# Patient Record
Sex: Female | Born: 1955
Health system: Southern US, Community
[De-identification: ages and names within clinical notes are randomized; demographics above are authoritative.]

## PROBLEM LIST (undated history)

## (undated) DIAGNOSIS — F329 Major depressive disorder, single episode, unspecified: Secondary | ICD-10-CM

## (undated) DIAGNOSIS — S93402A Sprain of unspecified ligament of left ankle, initial encounter: Secondary | ICD-10-CM

## (undated) DIAGNOSIS — G8929 Other chronic pain: Secondary | ICD-10-CM

## (undated) DIAGNOSIS — D649 Anemia, unspecified: Secondary | ICD-10-CM

## (undated) DIAGNOSIS — M810 Age-related osteoporosis without current pathological fracture: Secondary | ICD-10-CM

## (undated) DIAGNOSIS — M3500 Sicca syndrome, unspecified: Secondary | ICD-10-CM

## (undated) DIAGNOSIS — M069 Rheumatoid arthritis, unspecified: Secondary | ICD-10-CM

## (undated) DIAGNOSIS — D0510 Intraductal carcinoma in situ of unspecified breast: Secondary | ICD-10-CM

## (undated) DIAGNOSIS — F419 Anxiety disorder, unspecified: Secondary | ICD-10-CM

## (undated) DIAGNOSIS — M545 Low back pain: Secondary | ICD-10-CM

## (undated) HISTORY — DX: Anxiety disorder, unspecified: F41.9

## (undated) HISTORY — DX: Age-related osteoporosis without current pathological fracture: M81.0

## (undated) HISTORY — PX: BREAST SURGERY: SHX581

## (undated) HISTORY — DX: Rheumatoid arthritis, unspecified: M06.9

## (undated) HISTORY — DX: Major depressive disorder, single episode, unspecified: F32.9

## (undated) HISTORY — DX: Sjogren syndrome, unspecified: M35.00

## (undated) HISTORY — DX: Other chronic pain: G89.29

## (undated) HISTORY — DX: Low back pain: M54.5

## (undated) HISTORY — DX: Anemia, unspecified: D64.9

## (undated) HISTORY — PX: OTHER SURGICAL HISTORY: SHX169

## (undated) HISTORY — DX: Sprain of unspecified ligament of left ankle, initial encounter: S93.402A

## (undated) HISTORY — DX: Intraductal carcinoma in situ of unspecified breast: D05.10

## (undated) HISTORY — PX: BREAST BIOPSY: SHX20

---

## 1988-02-02 HISTORY — PX: LAPAROSCOPY: SHX197

## 1990-02-01 HISTORY — PX: BREAST LUMPECTOMY: SHX2

## 1990-11-02 DIAGNOSIS — D0510 Intraductal carcinoma in situ of unspecified breast: Secondary | ICD-10-CM | POA: Insufficient documentation

## 1994-02-01 HISTORY — PX: MOUTH RANULA EXCISION: SHX2049

## 1997-09-27 ENCOUNTER — Other Ambulatory Visit: Admission: RE | Admit: 1997-09-27 | Discharge: 1997-09-27 | Payer: Self-pay | Admitting: Obstetrics and Gynecology

## 1997-10-22 ENCOUNTER — Ambulatory Visit (HOSPITAL_COMMUNITY): Admission: RE | Admit: 1997-10-22 | Discharge: 1997-10-22 | Payer: Self-pay | Admitting: Obstetrics and Gynecology

## 1997-11-04 ENCOUNTER — Ambulatory Visit (HOSPITAL_COMMUNITY): Admission: RE | Admit: 1997-11-04 | Discharge: 1997-11-04 | Payer: Self-pay | Admitting: *Deleted

## 1998-04-01 ENCOUNTER — Ambulatory Visit (HOSPITAL_COMMUNITY): Admission: RE | Admit: 1998-04-01 | Discharge: 1998-04-01 | Payer: Self-pay | Admitting: *Deleted

## 1998-10-08 ENCOUNTER — Ambulatory Visit (HOSPITAL_COMMUNITY): Admission: RE | Admit: 1998-10-08 | Discharge: 1998-10-08 | Payer: Self-pay | Admitting: *Deleted

## 1998-12-05 ENCOUNTER — Other Ambulatory Visit: Admission: RE | Admit: 1998-12-05 | Discharge: 1998-12-05 | Payer: Self-pay | Admitting: *Deleted

## 1999-10-13 ENCOUNTER — Encounter: Admission: RE | Admit: 1999-10-13 | Discharge: 1999-10-13 | Payer: Self-pay | Admitting: *Deleted

## 2000-06-02 ENCOUNTER — Other Ambulatory Visit: Admission: RE | Admit: 2000-06-02 | Discharge: 2000-06-02 | Payer: Self-pay | Admitting: *Deleted

## 2000-10-21 ENCOUNTER — Encounter: Admission: RE | Admit: 2000-10-21 | Discharge: 2000-10-21 | Payer: Self-pay | Admitting: *Deleted

## 2000-11-07 ENCOUNTER — Encounter (INDEPENDENT_AMBULATORY_CARE_PROVIDER_SITE_OTHER): Payer: Self-pay | Admitting: *Deleted

## 2000-11-07 ENCOUNTER — Ambulatory Visit (HOSPITAL_BASED_OUTPATIENT_CLINIC_OR_DEPARTMENT_OTHER): Admission: RE | Admit: 2000-11-07 | Discharge: 2000-11-07 | Payer: Self-pay | Admitting: Surgery

## 2000-11-07 ENCOUNTER — Encounter: Payer: Self-pay | Admitting: Surgery

## 2001-05-15 ENCOUNTER — Other Ambulatory Visit: Admission: RE | Admit: 2001-05-15 | Discharge: 2001-09-14 | Payer: Self-pay | Admitting: *Deleted

## 2001-10-31 ENCOUNTER — Encounter: Admission: RE | Admit: 2001-10-31 | Discharge: 2001-10-31 | Payer: Self-pay | Admitting: *Deleted

## 2002-08-10 ENCOUNTER — Emergency Department (HOSPITAL_COMMUNITY): Admission: EM | Admit: 2002-08-10 | Discharge: 2002-08-10 | Payer: Self-pay | Admitting: Emergency Medicine

## 2002-08-10 ENCOUNTER — Encounter: Payer: Self-pay | Admitting: Emergency Medicine

## 2002-11-06 ENCOUNTER — Encounter: Admission: RE | Admit: 2002-11-06 | Discharge: 2002-11-06 | Payer: Self-pay | Admitting: *Deleted

## 2003-11-07 ENCOUNTER — Encounter: Admission: RE | Admit: 2003-11-07 | Discharge: 2003-11-07 | Payer: Self-pay | Admitting: *Deleted

## 2004-03-19 ENCOUNTER — Encounter: Admission: RE | Admit: 2004-03-19 | Discharge: 2004-03-19 | Payer: Self-pay | Admitting: *Deleted

## 2004-12-15 ENCOUNTER — Encounter: Admission: RE | Admit: 2004-12-15 | Discharge: 2004-12-15 | Payer: Self-pay | Admitting: *Deleted

## 2005-02-01 HISTORY — PX: ORIF FOOT FRACTURE: SHX2123

## 2005-03-18 ENCOUNTER — Ambulatory Visit (HOSPITAL_COMMUNITY): Admission: RE | Admit: 2005-03-18 | Discharge: 2005-03-18 | Payer: Self-pay | Admitting: *Deleted

## 2005-04-03 ENCOUNTER — Emergency Department (HOSPITAL_COMMUNITY): Admission: EM | Admit: 2005-04-03 | Discharge: 2005-04-03 | Payer: Self-pay | Admitting: Family Medicine

## 2005-04-27 ENCOUNTER — Encounter: Admission: RE | Admit: 2005-04-27 | Discharge: 2005-04-27 | Payer: Self-pay | Admitting: *Deleted

## 2005-05-02 DIAGNOSIS — Z9889 Other specified postprocedural states: Secondary | ICD-10-CM | POA: Insufficient documentation

## 2006-02-22 ENCOUNTER — Encounter: Admission: RE | Admit: 2006-02-22 | Discharge: 2006-02-22 | Payer: Self-pay | Admitting: *Deleted

## 2006-10-19 ENCOUNTER — Encounter: Admission: RE | Admit: 2006-10-19 | Discharge: 2006-10-19 | Payer: Self-pay | Admitting: Obstetrics and Gynecology

## 2007-01-20 ENCOUNTER — Emergency Department (HOSPITAL_COMMUNITY): Admission: EM | Admit: 2007-01-20 | Discharge: 2007-01-20 | Payer: Self-pay | Admitting: Family Medicine

## 2007-04-10 ENCOUNTER — Encounter: Admission: RE | Admit: 2007-04-10 | Discharge: 2007-04-10 | Payer: Self-pay | Admitting: Obstetrics and Gynecology

## 2008-06-12 ENCOUNTER — Encounter: Admission: RE | Admit: 2008-06-12 | Discharge: 2008-06-12 | Payer: Self-pay | Admitting: Obstetrics and Gynecology

## 2009-07-31 ENCOUNTER — Encounter: Admission: RE | Admit: 2009-07-31 | Discharge: 2009-07-31 | Payer: Self-pay | Admitting: Obstetrics and Gynecology

## 2010-02-01 HISTORY — PX: ABCESS DRAINAGE: SHX399

## 2010-02-22 ENCOUNTER — Encounter: Payer: Self-pay | Admitting: Family Medicine

## 2010-06-19 NOTE — Op Note (Signed)
[place]  Patient:    Andrea, Fields Visit Number: [id number] MRN: [id number]          Service Type: DSU Location: [facility] Attending Physician:  Andre Lefort Dictated by:   Sandria Bales. Ezzard Standing, M.D. Proc. Date: 11/07/00 Admit Date:  11/07/2000   CC:         Pershing Cox, M.D.  Dr. Jess Barters   Operative Report  DATE OF BIRTH:  October 17, 1955.  PREOPERATIVE DIAGNOSIS:  Microcalcifications, right breast, about the 8 to 9 oclock position.  POSTOPERATIVE DIAGNOSIS:  Microcalcifications, right breast, 8 oclock position.  PROCEDURE:  Needle-localized right breast biopsy.  SURGEON:  Sandria Bales. Ezzard Standing, M.D.  ANESTHESIA:  MAC with about 18 cc of 1% Xylocaine with epinephrine.  COMPLICATIONS:  None.  INDICATION FOR PROCEDURE:  [name] is a 55 year old white female who had a prior biopsy in 1992, at which time she had a ductal carcinoma in situ.  She has had no evidence of recurrence of disease.  She has recently had a mammogram which showed microcalcification in the right breast at the 8 to 9 oclock position.  She now comes for needle-localized excisional biopsy.  DESCRIPTION OF PROCEDURE:  The patient had a needle localization performed at the breast center.  She had a wire coming from out of her breast about the 8 oclock position approximately 2-3 cm off the edge of the areola.  Her right breast was prepped with Betadine solution and sterilely draped.  Using the wire as a guide, I made an incision which was about 3.5 cm in the skin and cored out approximately a 2 x 2 x 4 cm breast biopsy.  I did go down to the pectoralis chest muscle.  The specimen was sent for specimen mammogram, which confirmed the microcalcifications were excised.  The wound was then irrigated.  Hemostasis was controlled with Bovie electrocautery.  The subcutaneous tissue was closed with 3-0 Vicryl suture and the skin closed with a 5-0 Vicryl suture,  painted with benzoin and Steri-Strips, and sterilely dressed.  The patient tolerated the procedure well, was transported to the recovery room in good condition. Dictated by:   Sandria Bales. Ezzard Standing, M.D. Attending Physician:  Andre Lefort DD:  11/07/00 TD:  11/08/00 Job: [id number]

## 2010-09-15 ENCOUNTER — Other Ambulatory Visit: Payer: Self-pay | Admitting: Obstetrics & Gynecology

## 2010-09-15 DIAGNOSIS — Z1231 Encounter for screening mammogram for malignant neoplasm of breast: Secondary | ICD-10-CM

## 2010-09-21 ENCOUNTER — Ambulatory Visit
Admission: RE | Admit: 2010-09-21 | Discharge: 2010-09-21 | Disposition: A | Discharge status: Home / Self Care | Payer: Federal, State, Local not specified - PPO | Source: Ambulatory Visit | Attending: Obstetrics & Gynecology | Admitting: Obstetrics & Gynecology

## 2010-09-21 DIAGNOSIS — Z1231 Encounter for screening mammogram for malignant neoplasm of breast: Secondary | ICD-10-CM

## 2010-10-14 ENCOUNTER — Ambulatory Visit (INDEPENDENT_AMBULATORY_CARE_PROVIDER_SITE_OTHER): Payer: Federal, State, Local not specified - PPO | Admitting: Internal Medicine

## 2010-10-14 ENCOUNTER — Encounter: Payer: Self-pay | Admitting: Internal Medicine

## 2010-10-14 VITALS — BP 102/68 | HR 72 | Temp 98.2°F | Ht 61.5 in | Wt 104.0 lb

## 2010-10-14 DIAGNOSIS — M8580 Other specified disorders of bone density and structure, unspecified site: Secondary | ICD-10-CM

## 2010-10-14 DIAGNOSIS — R002 Palpitations: Secondary | ICD-10-CM

## 2010-10-14 DIAGNOSIS — R42 Dizziness and giddiness: Secondary | ICD-10-CM

## 2010-10-14 DIAGNOSIS — Z0000 Encounter for general adult medical examination without abnormal findings: Secondary | ICD-10-CM | POA: Insufficient documentation

## 2010-10-14 DIAGNOSIS — M899 Disorder of bone, unspecified: Secondary | ICD-10-CM

## 2010-10-14 DIAGNOSIS — M949 Disorder of cartilage, unspecified: Secondary | ICD-10-CM

## 2010-10-14 NOTE — Assessment & Plan Note (Addendum)
DEXA from 07/2009 reviewed.  She has multiple risk factors.  10 yr hip fx risk 3%.  I would not recommend bisphonates due to multiple dental issues.  Pt to consider using prolia. Pt advised to take vitamin D supplement daily. LEFT FEMUR NECK  Bone Mineral Density (BMD): 0.584 g/cm2  Young Adult T Score: -2.4  Z Score: -1.3

## 2010-10-14 NOTE — Assessment & Plan Note (Signed)
Pt with intermittent fluttering sensation.  Cardiac exam is norma.  EKG shows  NSR at 63 bpm.  Left atrial enlargement. Check BMET, TFTs.  Pt advised to reduce caffeine intake.

## 2010-10-14 NOTE — Assessment & Plan Note (Signed)
Reviewed adult health maintenance protocols. Arrange screening colonoscopy Encouraged smoking cessation

## 2010-10-14 NOTE — Patient Instructions (Addendum)
Please complete the following lab tests within 1-2 weeks:  TSH, Free T4, BMET  - 785.1 FLP, CBC - v70 Please call our office if you decide to proceed with Prolia injections Try performing Austin Miles exercises re:  Dizziness.  Also try checking your blood pressure laying down then standing in AM.

## 2010-10-14 NOTE — Assessment & Plan Note (Signed)
Pt with mild intermittent dizziness mainly in AM getting out of bed.  Possible mild BPV.  Trial of brandt daroff exercises.  Pt is nurse.  I asked to check BP laying then standing in AM. Patient advised to call office if symptoms persist or worsen.

## 2010-10-14 NOTE — Progress Notes (Signed)
Subjective:    Patient ID: Andrea Fields, female    DOB: July 19, 1955, 55 y.o.   MRN: [id number]  HPI  55 y/o white female to establish.  She has lived in [place] for 14 yrs but not had PCP.  She is followed by her GYN.  She notes last complete blood work within last 1-2 yrs.  She does not recall any significant abnormality.  She is due for FLP.  She reports prev HS CRP normal.  She complains of intermittent mild dizziness. Her symptoms has been chronic for years.  Her symptoms are most noticeable when she tries to get out of bed.  She denies associated headache, tinnitus, hearing loss or ear fullness.  She also has noticed intermittent fluttering in her chest.  No assoc chest pain or presyncope.  She drinks one to 1.5 cups of coffee per day.   She has only Fields glass of wine at bedtime.  She also has hx of osteopenia.  Prev vit d level reported normal.  She has hx of left foot metatarsal fracture.  She has never taken a bisphosphonate.  She mentions she has had numerous oral surgeries in the recent past and is considering dental implants.   Review of Systems  Constitutional: Negative for activity change, appetite change and unexpected weight change.  Eyes: Negative for visual disturbance.  Respiratory: Negative for cough, chest tightness and shortness of breath.   Cardiovascular: Negative for chest pain.  Genitourinary: Negative for difficulty urinating.  Neurological: Negative for headaches.  Gastrointestinal: Negative for abdominal pain, heartburn melena or hematochezia Psych: Negative for depression or anxiety.  Stressed out from two teenage children      Past Medical History  Diagnosis Date  . Anemia   . Arthritis   . DCIS (ductal carcinoma in situ)     History   Social History  . Marital Status: Married    Spouse Name: N/A    Number of Children: N/A  . Years of Education: N/A   Occupational History  . Not on file.   Social History Main Topics  . Smoking status:  Current Everyday Smoker    Types: Cigarettes  . Smokeless tobacco: Not on file   Comment: 1 cigarette per day  . Alcohol Use: Yes  . Drug Use: No  . Sexually Active:    Other Topics Concern  . Not on file   Social History Narrative  . No narrative on file    Past Surgical History  Procedure Date  . Breast surgery 1992, 2002    biopsy  . Mouth ranula excision 1996  . Laparoscopy 1990  . Orif foot fracture 2007    left 5th metatarsal  . Abcess drainage 2012    sinus    Family History  Problem Relation Age of Onset  . Hyperlipidemia Mother   . Diabetes Mother   . Arthritis Father   . Hyperlipidemia Father   . Cancer Maternal Aunt     breast  . Arthritis Maternal Grandmother   . Arthritis Maternal Grandfather   . Arthritis Paternal Grandmother   . Diabetes Paternal Grandmother   . Arthritis Paternal Grandfather     Allergies  Allergen Reactions  . Vectrin (Minocycline Hcl)     No current outpatient prescriptions on file prior to visit.    BP 102/68  Pulse 72  Temp(Src) 98.2 F (36.8 C) (Oral)  Ht 5' 1.5" (1.562 m)  Wt 104 lb (47.174 kg)  BMI 19.33 kg/m2  Objective:   Physical Exam   Constitutional: Appears well-developed and well-nourished. No distress.  Head: Normocephalic and atraumatic.  Right Ear: External ear normal.  Hearing is normal to finger rub Left Ear: External ear normal.  Hearing is normal to finger rub Mouth/Throat: Oropharynx is clear and moist.  Eyes: Conjunctivae are normal. Pupils are equal, round, and reactive to light.  Neck: Normal range of motion. Neck supple. No thyromegaly present. No carotid bruit Cardiovascular: Normal rate, regular rhythm and normal heart sounds.  Exam reveals no gallop and no friction rub.   No murmur heard. Pulmonary/Chest: Effort normal and breath sounds normal.  No wheezes. No rales.  Abdominal: Soft. Bowel sounds are normal. No mass. There is no tenderness.  Neurological: Alert. No cranial  nerve deficit.  negative cerebellar signs.  Upper and lower ext reflexes symmetric and in tact,  Normal gait,  No nystagmus Skin: Skin is warm and dry.  Psychiatric: Normal mood and affect. Behavior is normal.       Assessment & Plan:

## 2010-11-06 LAB — CBC
HCT: 39.3
Hemoglobin: 13.4
MCHC: 34.2
MCV: 88.8
Platelets: 399
RBC: 4.42
RDW: 11.6
WBC: 5.4

## 2010-11-06 LAB — DIFFERENTIAL
Basophils Absolute: 0
Basophils Relative: 0
Eosinophils Absolute: 0.1 — ABNORMAL LOW
Eosinophils Relative: 1
Lymphocytes Relative: 27
Lymphs Abs: 1.5
Monocytes Absolute: 0.5
Monocytes Relative: 10
Neutro Abs: 3.3
Neutrophils Relative %: 61

## 2010-11-25 ENCOUNTER — Other Ambulatory Visit (INDEPENDENT_AMBULATORY_CARE_PROVIDER_SITE_OTHER): Payer: Federal, State, Local not specified - PPO

## 2010-11-25 DIAGNOSIS — Z0000 Encounter for general adult medical examination without abnormal findings: Secondary | ICD-10-CM

## 2010-11-25 LAB — CBC WITH DIFFERENTIAL/PLATELET
Basophils Absolute: 0 10*3/uL (ref 0.0–0.1)
Basophils Relative: 0.8 % (ref 0.0–3.0)
Eosinophils Absolute: 0.1 10*3/uL (ref 0.0–0.7)
Eosinophils Relative: 2.3 % (ref 0.0–5.0)
HCT: 39.9 % (ref 36.0–46.0)
Hemoglobin: 13.7 g/dL (ref 12.0–15.0)
Lymphocytes Relative: 39.6 % (ref 12.0–46.0)
Lymphs Abs: 1.6 10*3/uL (ref 0.7–4.0)
MCHC: 34.3 g/dL (ref 30.0–36.0)
MCV: 91.2 fl (ref 78.0–100.0)
Monocytes Absolute: 0.4 10*3/uL (ref 0.1–1.0)
Monocytes Relative: 9.3 % (ref 3.0–12.0)
Neutro Abs: 1.9 10*3/uL (ref 1.4–7.7)
Neutrophils Relative %: 48 % (ref 43.0–77.0)
Platelets: 275 10*3/uL (ref 150.0–400.0)
RBC: 4.37 Mil/uL (ref 3.87–5.11)
RDW: 12.1 % (ref 11.5–14.6)
WBC: 4 10*3/uL — ABNORMAL LOW (ref 4.5–10.5)

## 2010-11-25 LAB — LDL CHOLESTEROL, DIRECT: Direct LDL: 155.2 mg/dL

## 2010-11-25 LAB — TSH: TSH: 2.04 u[IU]/mL (ref 0.35–5.50)

## 2010-11-25 LAB — BASIC METABOLIC PANEL
BUN: 15 mg/dL (ref 6–23)
CO2: 30 mEq/L (ref 19–32)
Calcium: 9.3 mg/dL (ref 8.4–10.5)
Chloride: 104 mEq/L (ref 96–112)
Creatinine, Ser: 0.6 mg/dL (ref 0.4–1.2)
GFR: 103.52 mL/min (ref >60.00)
Glucose, Bld: 88 mg/dL (ref 70–99)
Potassium: 4 mEq/L (ref 3.5–5.1)
Sodium: 140 mEq/L (ref 135–145)

## 2010-11-25 LAB — LIPID PANEL
Cholesterol: 229 mg/dL — ABNORMAL HIGH (ref 0–200)
HDL: 68.6 mg/dL (ref >39.00)
Total CHOL/HDL Ratio: 3
Triglycerides: 48 mg/dL (ref 0.0–149.0)
VLDL: 9.6 mg/dL (ref 0.0–40.0)

## 2010-11-25 LAB — T4, FREE: Free T4: 0.86 ng/dL (ref 0.60–1.60)

## 2010-11-26 ENCOUNTER — Encounter: Payer: Self-pay | Admitting: Internal Medicine

## 2010-12-02 ENCOUNTER — Telehealth: Payer: Self-pay | Admitting: *Deleted

## 2010-12-02 NOTE — Telephone Encounter (Signed)
Noted  

## 2010-12-02 NOTE — Telephone Encounter (Signed)
Pt wanted to let Dr Artist Pais know that she will not being doing the Prolia right now.  She is going to have oral surgery and she there is a contraindication with it.  Also she is going to have a GI screening at the first of the year

## 2010-12-21 ENCOUNTER — Encounter: Payer: Self-pay | Admitting: Gastroenterology

## 2011-01-15 ENCOUNTER — Encounter: Payer: Self-pay | Admitting: Internal Medicine

## 2011-01-15 ENCOUNTER — Ambulatory Visit (INDEPENDENT_AMBULATORY_CARE_PROVIDER_SITE_OTHER): Payer: Federal, State, Local not specified - PPO | Admitting: Internal Medicine

## 2011-01-15 VITALS — BP 110/74 | Temp 98.2°F | Wt 106.0 lb

## 2011-01-15 DIAGNOSIS — M2550 Pain in unspecified joint: Secondary | ICD-10-CM

## 2011-01-15 LAB — CBC WITH DIFFERENTIAL/PLATELET
Basophils Absolute: 0 10*3/uL (ref 0.0–0.1)
Basophils Relative: 0.4 % (ref 0.0–3.0)
Eosinophils Absolute: 0.1 10*3/uL (ref 0.0–0.7)
Eosinophils Relative: 2 % (ref 0.0–5.0)
HCT: 35.8 % — ABNORMAL LOW (ref 36.0–46.0)
Hemoglobin: 12.3 g/dL (ref 12.0–15.0)
Lymphocytes Relative: 31.3 % (ref 12.0–46.0)
Lymphs Abs: 1.2 10*3/uL (ref 0.7–4.0)
MCHC: 34.3 g/dL (ref 30.0–36.0)
MCV: 90.5 fl (ref 78.0–100.0)
Monocytes Absolute: 0.3 10*3/uL (ref 0.1–1.0)
Monocytes Relative: 7.5 % (ref 3.0–12.0)
Neutro Abs: 2.2 10*3/uL (ref 1.4–7.7)
Neutrophils Relative %: 58.8 % (ref 43.0–77.0)
Platelets: 304 10*3/uL (ref 150.0–400.0)
RBC: 3.96 Mil/uL (ref 3.87–5.11)
RDW: 11.7 % (ref 11.5–14.6)
WBC: 3.8 10*3/uL — ABNORMAL LOW (ref 4.5–10.5)

## 2011-01-15 LAB — SEDIMENTATION RATE: Sed Rate: 52 mm/hr — ABNORMAL HIGH (ref 0–22)

## 2011-01-15 MED ORDER — PREDNISONE 10 MG PO TABS: ORAL_TABLET | ORAL | Status: DC

## 2011-01-15 NOTE — Assessment & Plan Note (Signed)
55 y/o female with signs and symptoms of inflammatory arthritis.  Unclear if flu vaccine was trigger vs other viral infection.    Check CBCD and sed rate.  Check parvovirus titers. Use prednisone as directed.  If persistent symptoms, refer to rheumatologist.

## 2011-01-15 NOTE — Patient Instructions (Signed)
Please call our office if your symptoms do not improve or gets worse.  

## 2011-01-15 NOTE — Progress Notes (Signed)
  Subjective:    Patient ID: Andrea Fields, female    DOB: [birth year], 55 y.o.   MRN: [id number]  HPI  55 year old white female complains of arthralgias. Her symptoms started approximately 2 weeks ago after getting influenza vaccine. She notes neck pain, upper chest pain and her hands have been red and swollen. Her shoulders have been also achy as well as her lower extremities. She denies preceding illness. She denies fever. She has been taking Aleve at bedtime and during the day as needed. There's been no significant improvement in her symptoms.  Review of Systems Negative for fever,  Hands feel tight.  Past Medical History  Diagnosis Date  . Anemia   . Arthritis   . DCIS (ductal carcinoma in situ)     History   Social History  . Marital Status: Married    Spouse Name: N/A    Number of Children: N/A  . Years of Education: N/A   Occupational History  . Not on file.   Social History Main Topics  . Smoking status: Current Everyday Smoker    Types: Cigarettes  . Smokeless tobacco: Not on file   Comment: 1 cigarette per day  . Alcohol Use: Yes  . Drug Use: No  . Sexually Active:    Other Topics Concern  . Not on file   Social History Narrative  . No narrative on file    Past Surgical History  Procedure Date  . Breast surgery 1992, 2002    biopsy  . Mouth ranula excision 1996  . Laparoscopy 1990  . Orif foot fracture 2007    left 5th metatarsal  . Abcess drainage 2012    sinus    Family History  Problem Relation Age of Onset  . Hyperlipidemia Mother   . Diabetes Mother   . Arthritis Father   . Hyperlipidemia Father   . Cancer Maternal Aunt     breast  . Arthritis Maternal Grandmother   . Arthritis Maternal Grandfather   . Arthritis Paternal Grandmother   . Diabetes Paternal Grandmother   . Arthritis Paternal Grandfather     Allergies  Allergen Reactions  . Vectrin (Minocycline Hcl)     Current Outpatient Prescriptions on File Prior to Visit    Medication Sig Dispense Refill  . fish oil-omega-3 fatty acids 1000 MG capsule Take 2 g by mouth daily.        . Multiple Vitamin (MULTIVITAMIN) tablet Take 1 tablet by mouth daily.          BP 110/74  Temp(Src) 98.2 F (36.8 C) (Oral)  Wt 106 lb (48.081 kg)     Objective:   Physical Exam   Constitutional: Appears well-developed and well-nourished. No distress.  Head: Normocephalic and atraumatic.  Ear:  Right and left ear normal.  TMs clear.  Hearing is grossly normal Mouth/Throat: Oropharynx is clear and moist.  Eyes: Conjunctivae are normal. Pupils are equal, round, and reactive to light.  Neck: Normal range of motion. No adenopathy Cardiovascular: Normal rate, regular rhythm and normal heart sounds.  Exam reveals no gallop and no friction rub.  No murmur heard. Pulmonary/Chest: Effort normal and breath sounds normal.  No wheezes. No rales.  Neurological: Alert. No cranial nerve deficit.   Musculoskeletal: Mild edema and redness of bilateral metacarpal joints,  mild tenderness of bilateral shoulders  Psychiatric: Normal mood and affect. Behavior is normal.       Assessment & Plan:

## 2011-01-18 ENCOUNTER — Telehealth: Payer: Self-pay

## 2011-01-18 NOTE — Telephone Encounter (Addendum)
Pt started prednisone Saturday.  She is still having body aches, no fever.  Wants to know if the parvovirus is positive would she be contagious and if it is normal could she schedule a follow up appt with you to discuss what could be causing her symptons

## 2011-01-18 NOTE — Telephone Encounter (Signed)
Even if her parvovirus is positive, she is not contagious.  If she is not feeling better, we should consider rheumatology consult.

## 2011-01-18 NOTE — Telephone Encounter (Signed)
Pt would like lab results and to know if an appt is needed to further discuss her situation.  Pls advise.

## 2011-01-19 LAB — PARVOVIRUS B19 ANTIBODY, IGG AND IGM
Parovirus B19 IgG Abs: 5.9 Index — ABNORMAL HIGH (ref <0.9)
Parovirus B19 IgM Abs: <0.9 Index (ref <0.9)

## 2011-01-19 NOTE — Telephone Encounter (Signed)
Pt aware.

## 2011-01-20 ENCOUNTER — Ambulatory Visit (INDEPENDENT_AMBULATORY_CARE_PROVIDER_SITE_OTHER): Payer: Federal, State, Local not specified - PPO | Admitting: Internal Medicine

## 2011-01-20 ENCOUNTER — Encounter: Payer: Self-pay | Admitting: Internal Medicine

## 2011-01-20 DIAGNOSIS — M2550 Pain in unspecified joint: Secondary | ICD-10-CM

## 2011-01-20 NOTE — Assessment & Plan Note (Signed)
Pt with some improvement on prednisone taper.  I still suspect inflammatory arthritis.  Question triggered by influenza vaccine.   Finish prednisone taper.  If persistent symptoms, we discussed further workup and referral to rheumatologist.  Dr. Dareen Piano

## 2011-01-20 NOTE — Progress Notes (Signed)
  Subjective:    Patient ID: Andrea Fields, female    DOB: [birth year], 55 y.o.   MRN: [id number]  HPI  55 year old white female for followup regarding bilateral arthralgias. The patient has noticed mild improvement since starting prednisone therapy. Her sedimentation rate was elevated at 52. Her CBC showed slight neutropenia 3.8 normal hemoglobin. Her parvovirus IgG titer was elevated at 5.9. Parvovirus IgM was normal.  Her achiness across shoulders and upper neck area has also significantly improved since starting prednisone.  She still has "unusual sensation" in her hands and feet.  Review of Systems Negative for fever  Past Medical History  Diagnosis Date  . Anemia   . Arthritis   . DCIS (ductal carcinoma in situ)     History   Social History  . Marital Status: Married    Spouse Name: N/A    Number of Children: N/A  . Years of Education: N/A   Occupational History  . Not on file.   Social History Main Topics  . Smoking status: Current Everyday Smoker    Types: Cigarettes  . Smokeless tobacco: Not on file   Comment: 1 cigarette per day  . Alcohol Use: Yes  . Drug Use: No  . Sexually Active:    Other Topics Concern  . Not on file   Social History Narrative  . No narrative on file    Past Surgical History  Procedure Date  . Breast surgery 1992, 2002    biopsy  . Mouth ranula excision 1996  . Laparoscopy 1990  . Orif foot fracture 2007    left 5th metatarsal  . Abcess drainage 2012    sinus    Family History  Problem Relation Age of Onset  . Hyperlipidemia Mother   . Diabetes Mother   . Arthritis Father   . Hyperlipidemia Father   . Cancer Maternal Aunt     breast  . Arthritis Maternal Grandmother   . Arthritis Maternal Grandfather   . Arthritis Paternal Grandmother   . Diabetes Paternal Grandmother   . Arthritis Paternal Grandfather     Allergies  Allergen Reactions  . Vectrin (Minocycline Hcl)     Current Outpatient Prescriptions on File  Prior to Visit  Medication Sig Dispense Refill  . fish oil-omega-3 fatty acids 1000 MG capsule Take 2 g by mouth daily.        . Multiple Vitamin (MULTIVITAMIN) tablet Take 1 tablet by mouth daily.        . predniSONE (DELTASONE) 10 MG tablet 3 tabs once daily for 5 days, 2 tabs once daily for 5 days, then 1 tab once daily for 5 days  30 tablet  0    BP 102/68     Objective:   Physical Exam  Constitutional: Appears well-developed and well-nourished. No distress.  Cardiovascular: Normal rate, regular rhythm and normal heart sounds. Exam reveals no gallop and no friction rub. No murmur heard.  Pulmonary/Chest: Effort normal and breath sounds normal. No wheezes. No rales.  Musculoskeletal: hand edema and redness improved. Psychiatric: Normal mood and affect. Behavior is normal     Assessment & Plan:

## 2011-01-22 ENCOUNTER — Ambulatory Visit: Payer: Federal, State, Local not specified - PPO | Admitting: Internal Medicine

## 2011-01-25 ENCOUNTER — Telehealth: Payer: Self-pay

## 2011-01-25 NOTE — Telephone Encounter (Signed)
Pt is aware MD out of office until wednesday

## 2011-01-25 NOTE — Telephone Encounter (Signed)
Pt request to have prednisone called in to [facility].  Pt states she thinks she should stay on the 20 mg daily instead of tapering the dose based on the way pt is feeling.  Pls advise.

## 2011-01-27 MED ORDER — PREDNISONE 20 MG PO TABS: 20.0000 mg | ORAL_TABLET | Freq: Every day | ORAL | Status: AC

## 2011-01-27 NOTE — Telephone Encounter (Signed)
Ok to continue 20 mg for another week.  Please call in refill for prednsione

## 2011-01-27 NOTE — Telephone Encounter (Signed)
rx called in, pt aware 

## 2011-01-28 ENCOUNTER — Telehealth: Payer: Self-pay

## 2011-01-28 DIAGNOSIS — M2550 Pain in unspecified joint: Secondary | ICD-10-CM

## 2011-01-28 NOTE — Telephone Encounter (Signed)
Pt states needs a referral to Azzie Roup at [facility]. Pt states Arline Asp can call for more details.

## 2011-01-28 NOTE — Telephone Encounter (Signed)
Referral order placed.

## 2011-02-08 ENCOUNTER — Other Ambulatory Visit (INDEPENDENT_AMBULATORY_CARE_PROVIDER_SITE_OTHER): Payer: Federal, State, Local not specified - PPO

## 2011-02-08 ENCOUNTER — Other Ambulatory Visit: Payer: Federal, State, Local not specified - PPO | Admitting: Internal Medicine

## 2011-02-08 DIAGNOSIS — M2550 Pain in unspecified joint: Secondary | ICD-10-CM

## 2011-02-08 LAB — HIGH SENSITIVITY CRP: CRP, High Sensitivity: 8.22 mg/L — ABNORMAL HIGH (ref 0.000–5.000)

## 2011-02-09 LAB — CYCLIC CITRUL PEPTIDE ANTIBODY, IGG: Cyclic Citrullin Peptide Ab: <2 U/mL (ref 0.0–5.0)

## 2011-02-09 LAB — ANTI-NUCLEAR AB-TITER (ANA TITER): ANA Titer 1: 1:40 {titer} — ABNORMAL HIGH

## 2011-02-09 LAB — ANA: Anti Nuclear Antibody(ANA): POSITIVE — AB

## 2011-02-09 LAB — RHEUMATOID FACTOR: Rhuematoid fact SerPl-aCnc: 59 IU/mL — ABNORMAL HIGH (ref <=14)

## 2011-02-12 ENCOUNTER — Telehealth: Payer: Self-pay | Admitting: *Deleted

## 2011-02-12 NOTE — Telephone Encounter (Signed)
Pt is requesting lab results, please.

## 2011-02-12 NOTE — Telephone Encounter (Signed)
See result note.  

## 2011-03-16 ENCOUNTER — Telehealth: Payer: Self-pay | Admitting: *Deleted

## 2011-03-16 DIAGNOSIS — M2550 Pain in unspecified joint: Secondary | ICD-10-CM

## 2011-03-16 NOTE — Telephone Encounter (Addendum)
Pt. Would like a 2nd opinion of her visit with Dr. Dareen Piano to Rheumatology to [facility], please. Please call [phone] at the Rheumatology Dept.

## 2011-03-17 NOTE — Telephone Encounter (Signed)
Order placed for referral.  

## 2011-03-17 NOTE — Telephone Encounter (Signed)
Ok to place order for rheum consult at [facility] for second opinion.

## 2011-09-23 ENCOUNTER — Other Ambulatory Visit: Payer: Self-pay | Admitting: Obstetrics & Gynecology

## 2011-09-23 DIAGNOSIS — Z1231 Encounter for screening mammogram for malignant neoplasm of breast: Secondary | ICD-10-CM

## 2011-10-12 ENCOUNTER — Ambulatory Visit
Admission: RE | Admit: 2011-10-12 | Discharge: 2011-10-12 | Disposition: A | Discharge status: Home / Self Care | Payer: Federal, State, Local not specified - PPO | Source: Ambulatory Visit | Attending: Obstetrics & Gynecology | Admitting: Obstetrics & Gynecology

## 2011-10-12 DIAGNOSIS — Z1231 Encounter for screening mammogram for malignant neoplasm of breast: Secondary | ICD-10-CM

## 2011-11-22 ENCOUNTER — Telehealth: Payer: Self-pay | Admitting: Internal Medicine

## 2011-11-22 ENCOUNTER — Encounter: Payer: Self-pay | Admitting: Internal Medicine

## 2011-11-22 DIAGNOSIS — Z0000 Encounter for general adult medical examination without abnormal findings: Secondary | ICD-10-CM

## 2011-11-22 NOTE — Telephone Encounter (Signed)
Orders entered

## 2011-11-22 NOTE — Telephone Encounter (Signed)
Patient has scheduled her cpx for 12/08/11 and she will need her cpx labs ordered to be done at [facility], however she already had a cbc and a cmet done by [facility] and do not want to repeat those as she will bring those results in when she comes. Please order other labs for Andrea Fields.

## 2011-11-22 NOTE — Telephone Encounter (Signed)
Please advise 

## 2011-11-22 NOTE — Telephone Encounter (Signed)
Fasting lipid panel, TSH - v70

## 2011-12-01 ENCOUNTER — Other Ambulatory Visit (INDEPENDENT_AMBULATORY_CARE_PROVIDER_SITE_OTHER): Payer: Federal, State, Local not specified - PPO

## 2011-12-01 ENCOUNTER — Telehealth: Payer: Self-pay | Admitting: Internal Medicine

## 2011-12-01 DIAGNOSIS — Z0000 Encounter for general adult medical examination without abnormal findings: Secondary | ICD-10-CM

## 2011-12-01 LAB — HEPATIC FUNCTION PANEL
ALT: 33 U/L (ref 0–35)
AST: 26 U/L (ref 0–37)
Albumin: 3.8 g/dL (ref 3.5–5.2)
Alkaline Phosphatase: 51 U/L (ref 39–117)
Bilirubin, Direct: 0.1 mg/dL (ref 0.0–0.3)
Total Bilirubin: 0.6 mg/dL (ref 0.3–1.2)
Total Protein: 7.2 g/dL (ref 6.0–8.3)

## 2011-12-01 LAB — LIPID PANEL
Cholesterol: 236 mg/dL — ABNORMAL HIGH (ref 0–200)
HDL: 66.4 mg/dL (ref >39.00)
Total CHOL/HDL Ratio: 4
Triglycerides: 73 mg/dL (ref 0.0–149.0)
VLDL: 14.6 mg/dL (ref 0.0–40.0)

## 2011-12-01 LAB — LDL CHOLESTEROL, DIRECT: Direct LDL: 150.9 mg/dL

## 2011-12-01 LAB — TSH: TSH: 1.9 u[IU]/mL (ref 0.35–5.50)

## 2011-12-01 NOTE — Telephone Encounter (Signed)
Pt went to [facility] office to get labs drawn for a cpx next wk and the only lab that was ordered was for tsh. Pt is req to get lipid panel and hepatic panel done. Pls order the necessary labs.

## 2011-12-01 NOTE — Telephone Encounter (Signed)
Future orders placed, pt aware 

## 2011-12-01 NOTE — Addendum Note (Signed)
Addended by: Alfred Levins D on: 12/01/2011 11:06 AM   Modules accepted: Orders

## 2011-12-08 ENCOUNTER — Encounter: Payer: Self-pay | Admitting: Internal Medicine

## 2011-12-08 ENCOUNTER — Ambulatory Visit (INDEPENDENT_AMBULATORY_CARE_PROVIDER_SITE_OTHER): Payer: Federal, State, Local not specified - PPO | Admitting: Internal Medicine

## 2011-12-08 VITALS — BP 116/80 | HR 76 | Temp 98.3°F | Ht 62.0 in | Wt 109.0 lb

## 2011-12-08 DIAGNOSIS — R1310 Dysphagia, unspecified: Secondary | ICD-10-CM

## 2011-12-08 DIAGNOSIS — M3500 Sicca syndrome, unspecified: Secondary | ICD-10-CM

## 2011-12-08 DIAGNOSIS — M069 Rheumatoid arthritis, unspecified: Secondary | ICD-10-CM

## 2011-12-08 DIAGNOSIS — Z0000 Encounter for general adult medical examination without abnormal findings: Secondary | ICD-10-CM

## 2011-12-08 NOTE — Patient Instructions (Addendum)
Please follow up with GI for screening colonoscopy and EGD.

## 2011-12-08 NOTE — Assessment & Plan Note (Signed)
Reviewed adult health maintenance protocols.  Reschedule screening colonoscopy.   Patient up to date with adult vaccines.  LDL is elevated.  Trial of dietary changes for now.  Patient will discuss statin use with her rheumatologist.

## 2011-12-08 NOTE — Progress Notes (Signed)
Subjective:    Patient ID: Andrea Fields, female    DOB: [birth year], 56 y.o.   MRN: [id number]  HPI  56 year old white female previously seen for arthralgias for followup. Patient seen by rheumatologist-Dr. Dareen Piano. She was diagnosed with Sjogren's disease and secondary rheumatoid arthritis. She is to stay on prednisone but now switched to methotrexate. Patient reports joint pains have significantly improved. Dr. Dareen Piano is monitoring her LFTs and blood counts.  Patient did not go for screening colonoscopy yet. Patient complains of intermittent dysphasia in her left lower throat. Symptoms can occur with any kind of food. She denies any palpable neck mass.  Lab Results  Component Value Date   CHOL 236* 12/01/2011   HDL 66.40 12/01/2011   LDLDIRECT 150.9 12/01/2011   TRIG 73.0 12/01/2011   CHOLHDL 4 12/01/2011   Lab Results  Component Value Date   ALT 33 12/01/2011   AST 26 12/01/2011   ALKPHOS 51 12/01/2011   BILITOT 0.6 12/01/2011    Review of Systems   Constitutional: Negative for activity change, appetite change and unexpected weight change.  Eyes: Negative for visual disturbance.  Respiratory: Negative for cough, chest tightness and shortness of breath.   Cardiovascular: Negative for chest pain.  Genitourinary: Negative for difficulty urinating.  Neurological: Negative for headaches.  Gastrointestinal: Negative for abdominal pain, heartburn melena or hematochezia Psych: Negative for depression or anxiety  Past Medical History  Diagnosis Date  . Anemia   . Arthritis   . DCIS (ductal carcinoma in situ)   . Sjogren's syndrome   . Rheumatoid arthritis     History   Social History  . Marital Status: Married    Spouse Name: N/A    Number of Children: N/A  . Years of Education: N/A   Occupational History  . Not on file.   Social History Main Topics  . Smoking status: Current Every Day Smoker    Types: Cigarettes  . Smokeless tobacco: Not on file   Comment: 1 cigarette per day  . Alcohol Use: Yes  . Drug Use: No  . Sexually Active:    Other Topics Concern  . Not on file   Social History Narrative  . No narrative on file    Past Surgical History  Procedure Date  . Breast surgery 1992, 2002    biopsy  . Mouth ranula excision 1996  . Laparoscopy 1990  . Orif foot fracture 2007    left 5th metatarsal  . Abcess drainage 2012    sinus    Family History  Problem Relation Age of Onset  . Hyperlipidemia Mother   . Diabetes Mother   . Arthritis Father   . Hyperlipidemia Father   . Cancer Maternal Aunt     breast  . Arthritis Maternal Grandmother   . Arthritis Maternal Grandfather   . Arthritis Paternal Grandmother   . Diabetes Paternal Grandmother   . Arthritis Paternal Grandfather     Allergies  Allergen Reactions  . Vectrin (Minocycline Hcl)     Current Outpatient Prescriptions on File Prior to Visit  Medication Sig Dispense Refill  . escitalopram (LEXAPRO) 10 MG tablet Take 10 mg by mouth daily.      . fish oil-omega-3 fatty acids 1000 MG capsule Take 2 g by mouth daily.        . Multiple Vitamin (MULTIVITAMIN) tablet Take 1 tablet by mouth daily.          BP 116/80  Pulse 76  Temp 98.3 F (36.8  C) (Oral)  Ht 5\' 2"  (1.575 m)  Wt 109 lb (49.442 kg)  BMI 19.94 kg/m2        Objective:   Physical Exam  Constitutional: She is oriented to person, place, and time. She appears well-developed and well-nourished. No distress.  HENT:  Head: Normocephalic and atraumatic.  Right Ear: External ear normal.  Left Ear: External ear normal.  Mouth/Throat: Oropharynx is clear and moist.  Eyes: EOM are normal. Pupils are equal, round, and reactive to light.  Neck: Neck supple.  Cardiovascular: Normal rate, regular rhythm and normal heart sounds.   No murmur heard. Pulmonary/Chest: Effort normal and breath sounds normal. She has no wheezes.  Abdominal: Soft. Bowel sounds are normal. There is no tenderness.    Musculoskeletal: She exhibits no edema.  Lymphadenopathy:    She has no cervical adenopathy.  Neurological: She is alert and oriented to person, place, and time. No cranial nerve deficit.  Skin: Skin is warm and dry.  Psychiatric: She has a normal mood and affect. Her behavior is normal.          Assessment & Plan:

## 2011-12-08 NOTE — Assessment & Plan Note (Signed)
Controlled with MTX.  Followed by Dr. Dareen Piano.

## 2011-12-08 NOTE — Assessment & Plan Note (Signed)
Patient experiencing intermittent lower throat dysphagia.  Refer to GI for further evaluation.

## 2011-12-13 ENCOUNTER — Encounter: Payer: Self-pay | Admitting: Gastroenterology

## 2012-01-10 ENCOUNTER — Ambulatory Visit (INDEPENDENT_AMBULATORY_CARE_PROVIDER_SITE_OTHER): Payer: Federal, State, Local not specified - PPO | Admitting: Gastroenterology

## 2012-01-10 ENCOUNTER — Encounter: Payer: Self-pay | Admitting: Gastroenterology

## 2012-01-10 VITALS — BP 110/70 | HR 76 | Ht 61.5 in | Wt 107.8 lb

## 2012-01-10 DIAGNOSIS — K5900 Constipation, unspecified: Secondary | ICD-10-CM

## 2012-01-10 DIAGNOSIS — Z1211 Encounter for screening for malignant neoplasm of colon: Secondary | ICD-10-CM

## 2012-01-10 MED ORDER — PEG-KCL-NACL-NASULF-NA ASC-C 100 G PO SOLR: 1.0000 | Freq: Once | ORAL | Status: DC

## 2012-01-10 NOTE — Progress Notes (Signed)
HPI: This is a    very pleasant 56 year old woman whom I am meeting for the first time today.  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  Never had colonoscopy for colon cancer screening.  She tends to be constipated.   For years. Never an issue on vacation.  Will usually have BM about 3 times a week. Has a lot of bloating.  Never blood in her stool.  She feels much better when on vacation she will have BM every day.  Eats a lot of fruits, veggies.  Started probioitc a year ago, noticed no difference.  Never tried fiber supplements.   Last year diagnosed with RA and Schogren's. She on methotrexate. Dry mucous membranes, also hand pains.     She had a sore throat (left side then right side of neck) that has completely resolved.    Has had pyrosis before.  No dysphagia.  + weight loss with RA diagnosis.    Review of systems: Pertinent positive and negative review of systems were noted in the above HPI section. Complete review of systems was performed and was otherwise normal.    Past Medical History  Diagnosis Date  . Anemia   . Arthritis   . DCIS (ductal carcinoma in situ)   . Sjogren's syndrome   . Rheumatoid arthritis     Past Surgical History  Procedure Date  . Breast surgery 1992, 2002    biopsy  . Mouth ranula excision 1996  . Laparoscopy 1990  . Orif foot fracture 2007    left 5th metatarsal  . Abcess drainage 2012    sinus    Current Outpatient Prescriptions  Medication Sig Dispense Refill  . escitalopram (LEXAPRO) 10 MG tablet Take 10 mg by mouth daily.      . fish oil-omega-3 fatty acids 1000 MG capsule Take 2 g by mouth daily.        . folic acid (FOLVITE) 1 MG tablet Take 1 mg by mouth daily.      . methotrexate (RHEUMATREX) 2.5 MG tablet Take 2.5 mg by mouth once a week. Caution:Chemotherapy. Protect from light.      . Multiple Vitamin (MULTIVITAMIN) tablet Take 1 tablet by mouth daily.        . Probiotic Product (PROBIOTIC DAILY PO) Take 1  capsule by mouth daily.      . vitamin C (ASCORBIC ACID) 500 MG tablet Take 500 mg by mouth daily.        Allergies as of 01/10/2012 - Review Complete 01/10/2012  Allergen Reaction Noted  . Vectrin (minocycline hcl)  10/14/2010    Family History  Problem Relation Age of Onset  . Hyperlipidemia Mother   . Diabetes Mother   . Arthritis Father   . Hyperlipidemia Father   . Cancer Maternal Aunt     breast  . Arthritis Maternal Grandmother   . Arthritis Maternal Grandfather   . Arthritis Paternal Grandmother   . Diabetes Paternal Grandmother   . Arthritis Paternal Grandfather     History   Social History  . Marital Status: Married    Spouse Name: N/A    Number of Children: 2  . Years of Education: N/A   Occupational History  . RN [employer]   Social History Main Topics  . Smoking status: Former Smoker    Types: Cigarettes  . Smokeless tobacco: Never Used     Comment: 1 cigarette per day  . Alcohol Use: Yes  . Drug Use:  No  . Sexually Active: Not on file   Other Topics Concern  . Not on file   Social History Narrative  . No narrative on file       Physical Exam: BP 110/70  Pulse 76  Ht 5' 1.5" (1.562 m)  Wt 107 lb 12.8 oz (48.898 kg)  BMI 20.04 kg/m2 Constitutional: generally well-appearing Psychiatric: alert and oriented x3 Eyes: extraocular movements intact Mouth: oral pharynx moist, no lesions Neck: supple no lymphadenopathy Cardiovascular: heart regular rate and rhythm Lungs: clear to auscultation bilaterally Abdomen: soft, nontender, nondistended, no obvious ascites, no peritoneal signs, normal bowel sounds Extremities: no lower extremity edema bilaterally Skin: no lesions on visible extremities    Assessment and plan: 56 y.o. female with  chronic constipation, recent sore throat and resolved swallowing difficulty  First, she is going to start taking fiber supplements on a daily basis to try to help regulate her bowels a bit. Second she  needs a colonoscopy for her mild constipation and routine screening as well. Third her upper GI like symptoms have completely resolved. She has had pyrosis that completely resolved with proton pump inhibitor 1-2 years ago. Recently she had more of a sore throat phenomenon than specifically swallowing difficulties. She did not feel very strongly about upper endoscopy and neither do I. and so we will simply observe her clinically and if her dysphasia or sore throat issue returns she will call.

## 2012-01-10 NOTE — Patient Instructions (Addendum)
If you have recurrent swallowing, then call. You will be set up for a colonoscopy for constipation and routine screening (LEC, moderate sedation). Please call in a couple of weeks to schedule [phone] Please start taking citrucel (orange flavored) powder fiber supplement.  This may cause some bloating at first but that usually goes away. Begin with a small spoonful and work your way up to a large, heaping spoonful daily over a week.

## 2012-02-14 ENCOUNTER — Telehealth: Payer: Self-pay | Admitting: Internal Medicine

## 2012-02-14 DIAGNOSIS — M8580 Other specified disorders of bone density and structure, unspecified site: Secondary | ICD-10-CM

## 2012-02-14 NOTE — Telephone Encounter (Signed)
Ok per Dr Artist Pais, referral order placed

## 2012-02-14 NOTE — Telephone Encounter (Signed)
Pt needs referral for Bone Density. [facility]. Thank you.

## 2012-03-01 ENCOUNTER — Other Ambulatory Visit: Payer: Federal, State, Local not specified - PPO

## 2012-03-01 ENCOUNTER — Ambulatory Visit
Admission: RE | Admit: 2012-03-01 | Discharge: 2012-03-01 | Disposition: A | Discharge status: Home / Self Care | Payer: Federal, State, Local not specified - PPO | Source: Ambulatory Visit | Attending: Internal Medicine | Admitting: Internal Medicine

## 2012-03-01 DIAGNOSIS — M8580 Other specified disorders of bone density and structure, unspecified site: Secondary | ICD-10-CM

## 2012-03-06 ENCOUNTER — Encounter: Payer: Self-pay | Admitting: Internal Medicine

## 2012-03-18 ENCOUNTER — Other Ambulatory Visit: Payer: Self-pay

## 2012-09-05 ENCOUNTER — Telehealth: Payer: Self-pay | Admitting: Internal Medicine

## 2012-09-05 NOTE — Telephone Encounter (Signed)
Patient Information:  Caller Name: Kyrstan  Phone: [phone]  Patient: Andrea Fields, Andrea Fields  Gender: Female  DOB: [birth year]  Age: 57 Years  PCP: Artist Pais Doe-Hyun Molly Maduro) (Adults only)  Office Follow Up:  Does the office need to follow up with this patient?: No  Instructions For The Office: N/A  RN Note:  Posterior back pain is worse when first gets up. Some discomfort when moves. Deies shoulder pain. Pain located in back above waist line radiates around front to waist. Declined triage. Will be off work 09/06/12; would like to see MD then.  When advised Dr Artist Pais is not working 09/06/12, caller declined appointment with another provider and asked to be seen by Dr Artist Pais on Friday 09/08/12.  When informed RN would transfer to scheduler for future appointment, caller reported she was unable to remain on the phone due to another appointment and would call scheduler back later.  Symptoms  Reason For Call & Symptoms: Posterior chest pain  between waste and rib cage and mostly on left side for past 3 weeks. Pain rated mild to moderate; wakens her when she moves.   Pain is worse when takes a deep breath or moves. Initially, suspected she had a muscle.  History of Sjogren's disease.  Rhematologist office advised her to call PCP to be seen.  Been off Methotrexate since May.  Reviewed Health History In EMR: Yes  Reviewed Medications In EMR: Yes  Reviewed Allergies In EMR: Yes  Reviewed Surgeries / Procedures: Yes  Date of Onset of Symptoms: 08/15/2012  Guideline(s) Used:  Back Pain  No Protocol Available - Sick Adult  Disposition Per Guideline:   See Today or Tomorrow in Office  Reason For Disposition Reached:   Nursing judgment  Advice Given:  N/A  RN Overrode Recommendation:  Make Appointment  Declined triage; requests appointment for 09/06/12 or 09/08/12.

## 2012-09-05 NOTE — Telephone Encounter (Signed)
duplicate

## 2012-09-05 NOTE — Telephone Encounter (Signed)
Ok per Dr Artist Pais to schedule on Friday

## 2012-09-05 NOTE — Telephone Encounter (Signed)
PT states that she would like to see Dr. Artist Pais on Friday afternoon after 2pm. She is having pain in her back (semi like like chest pain). She states that it may be a pulled muscle. Please assist.

## 2012-09-06 NOTE — Telephone Encounter (Signed)
Pt chose not to schedule appt.

## 2012-09-06 NOTE — Telephone Encounter (Signed)
PT states that she has chosen not to schedule an appt at this time. FYI.

## 2012-09-06 NOTE — Telephone Encounter (Signed)
lmovm /ga °

## 2012-09-08 ENCOUNTER — Encounter: Payer: Self-pay | Admitting: Internal Medicine

## 2012-09-08 ENCOUNTER — Ambulatory Visit (INDEPENDENT_AMBULATORY_CARE_PROVIDER_SITE_OTHER)
Admission: RE | Admit: 2012-09-08 | Discharge: 2012-09-08 | Disposition: A | Discharge status: Home / Self Care | Payer: Federal, State, Local not specified - PPO | Source: Ambulatory Visit | Attending: Internal Medicine | Admitting: Internal Medicine

## 2012-09-08 ENCOUNTER — Ambulatory Visit (INDEPENDENT_AMBULATORY_CARE_PROVIDER_SITE_OTHER): Payer: Federal, State, Local not specified - PPO | Admitting: Internal Medicine

## 2012-09-08 VITALS — BP 114/74 | HR 72 | Temp 98.2°F | Wt 113.0 lb

## 2012-09-08 DIAGNOSIS — R079 Chest pain, unspecified: Secondary | ICD-10-CM

## 2012-09-08 DIAGNOSIS — M542 Cervicalgia: Secondary | ICD-10-CM

## 2012-09-08 NOTE — Patient Instructions (Addendum)
We will contact you re: x ray results Take aleve as directed Patient advised to call office if symptoms persist or worsen. Follow up with Dr. Dareen Piano as planned

## 2012-09-08 NOTE — Assessment & Plan Note (Signed)
I suspect patient's discomfort coming from musculoskeletal source. Her EKG is unremarkable. Given her symptoms worse with taking a deep breath, I recommend we obtain chest x-ray to rule out pneumothorax or other rib abnormality. Patient to try taking Aleve once daily for 1 to 2 weeks. She has tried [facility] in the past without any improvement. Unclear whether patient's musculoskeletal complaints exacerbation of her rheumatoid arthritis/Sjogren's. She has followup with her rheumatologist in September of 2014.

## 2012-09-08 NOTE — Progress Notes (Signed)
Subjective:    Patient ID: Andrea Fields, female    DOB: 1955-02-16, 57 y.o.   MRN: [id number]  HPI  57 year old white female with history of possible Sjogren's disease, rheumatoid arthritis and osteopenia complains of unexplained left upper back pain, chest discomfort and shoulder pain. Symptoms have been ongoing for last 3 weeks. Patient reports discontinuing methotrexate in May of 2014. Her symptoms started 3 weeks ago while she was vacationing. She is not sure whether she strained her left neck/shoulder area lifting heavy luggage. Patient became concerned when her symptoms got worse with taking a deep breath. Patient also reports discomfort is worse with coughing. Patient reports discomfort feels like "gas pain" that last for a few seconds.  She denies any shortness of breath or chest heaviness.  Review of Systems Negative for fever or chills  Past Medical History  Diagnosis Date  . Anemia   . Arthritis   . DCIS (ductal carcinoma in situ)   . Sjogren's syndrome   . Rheumatoid arthritis(714.0)     History   Social History  . Marital Status: Married    Spouse Name: N/A    Number of Children: 2  . Years of Education: N/A   Occupational History  . RN [employer]   Social History Main Topics  . Smoking status: Former Smoker    Types: Cigarettes  . Smokeless tobacco: Never Used     Comment: 1 cigarette per day  . Alcohol Use: Yes  . Drug Use: No  . Sexually Active: Not on file   Other Topics Concern  . Not on file   Social History Narrative  . No narrative on file    Past Surgical History  Procedure Laterality Date  . Breast surgery  1992, 2002    biopsy  . Mouth ranula excision  1996  . Laparoscopy  1990  . Orif foot fracture  2007    left 5th metatarsal  . Abcess drainage  2012    sinus    Family History  Problem Relation Age of Onset  . Hyperlipidemia Mother   . Diabetes Mother   . Arthritis Father   . Hyperlipidemia Father   . Cancer Maternal  Aunt     breast  . Arthritis Maternal Grandmother   . Arthritis Maternal Grandfather   . Arthritis Paternal Grandmother   . Diabetes Paternal Grandmother   . Arthritis Paternal Grandfather     Allergies  Allergen Reactions  . Vectrin (Minocycline Hcl)     Current Outpatient Prescriptions on File Prior to Visit  Medication Sig Dispense Refill  . escitalopram (LEXAPRO) 10 MG tablet Take 10 mg by mouth daily.      . fish oil-omega-3 fatty acids 1000 MG capsule Take 2 g by mouth daily.        . Multiple Vitamin (MULTIVITAMIN) tablet Take 1 tablet by mouth daily.        . peg 3350 powder (MOVIPREP) 100 G SOLR Take 1 kit (100 g total) by mouth once.  1 kit  0  . Probiotic Product (PROBIOTIC DAILY PO) Take 1 capsule by mouth daily.      . vitamin C (ASCORBIC ACID) 500 MG tablet Take 500 mg by mouth daily.       No current facility-administered medications on file prior to visit.    BP 114/74  Pulse 72  Temp(Src) 98.2 F (36.8 C) (Oral)  Wt 113 lb (51.256 kg)  BMI 21.01 kg/m2   EKG shows normal sinus  rhythm at 72 beats per minute. Possible left atrial enlargement. This is unchanged from previous EKG in 2012.    Objective:   Physical Exam  Constitutional: She appears well-developed and well-nourished.  HENT:  Head: Normocephalic and atraumatic.  Mouth/Throat: Oropharynx is clear and moist.  Cardiovascular: Normal rate, regular rhythm and normal heart sounds.   Pulmonary/Chest: Effort normal and breath sounds normal. She exhibits no tenderness.  Musculoskeletal:  Mild discomfort with cervical rotation and side bending. She has tenderness near the center of left upper trapezius area.  Skin:  No obvious rash          Assessment & Plan:

## 2012-09-10 ENCOUNTER — Encounter: Payer: Self-pay | Admitting: Internal Medicine

## 2012-10-10 ENCOUNTER — Other Ambulatory Visit: Payer: Self-pay

## 2012-10-10 DIAGNOSIS — Z853 Personal history of malignant neoplasm of breast: Secondary | ICD-10-CM

## 2012-10-10 DIAGNOSIS — Z9889 Other specified postprocedural states: Secondary | ICD-10-CM

## 2012-10-10 DIAGNOSIS — Z1231 Encounter for screening mammogram for malignant neoplasm of breast: Secondary | ICD-10-CM

## 2012-10-31 ENCOUNTER — Ambulatory Visit
Admission: RE | Admit: 2012-10-31 | Discharge: 2012-10-31 | Disposition: A | Discharge status: Home / Self Care | Payer: Federal, State, Local not specified - PPO | Source: Ambulatory Visit

## 2012-10-31 DIAGNOSIS — Z1231 Encounter for screening mammogram for malignant neoplasm of breast: Secondary | ICD-10-CM

## 2012-10-31 DIAGNOSIS — Z853 Personal history of malignant neoplasm of breast: Secondary | ICD-10-CM

## 2012-10-31 DIAGNOSIS — Z9889 Other specified postprocedural states: Secondary | ICD-10-CM

## 2012-11-02 ENCOUNTER — Other Ambulatory Visit: Payer: Self-pay | Admitting: Obstetrics & Gynecology

## 2012-11-02 DIAGNOSIS — R928 Other abnormal and inconclusive findings on diagnostic imaging of breast: Secondary | ICD-10-CM

## 2012-11-07 LAB — HM PAP SMEAR: HM Pap smear: NEGATIVE

## 2012-11-15 ENCOUNTER — Ambulatory Visit
Admission: RE | Admit: 2012-11-15 | Discharge: 2012-11-15 | Disposition: A | Discharge status: Home / Self Care | Payer: Federal, State, Local not specified - PPO | Source: Ambulatory Visit | Attending: Obstetrics & Gynecology | Admitting: Obstetrics & Gynecology

## 2012-11-15 DIAGNOSIS — R928 Other abnormal and inconclusive findings on diagnostic imaging of breast: Secondary | ICD-10-CM

## 2012-11-16 ENCOUNTER — Other Ambulatory Visit: Payer: Federal, State, Local not specified - PPO

## 2012-12-07 ENCOUNTER — Other Ambulatory Visit: Payer: Self-pay

## 2013-10-15 ENCOUNTER — Other Ambulatory Visit: Payer: Self-pay

## 2013-10-15 DIAGNOSIS — Z9889 Other specified postprocedural states: Secondary | ICD-10-CM

## 2013-10-15 DIAGNOSIS — Z853 Personal history of malignant neoplasm of breast: Secondary | ICD-10-CM

## 2013-10-15 DIAGNOSIS — Z1231 Encounter for screening mammogram for malignant neoplasm of breast: Secondary | ICD-10-CM

## 2013-11-01 ENCOUNTER — Ambulatory Visit
Admission: RE | Admit: 2013-11-01 | Discharge: 2013-11-01 | Disposition: A | Discharge status: Home / Self Care | Payer: Federal, State, Local not specified - PPO | Source: Ambulatory Visit

## 2013-11-01 DIAGNOSIS — Z9889 Other specified postprocedural states: Secondary | ICD-10-CM

## 2013-11-01 DIAGNOSIS — Z853 Personal history of malignant neoplasm of breast: Secondary | ICD-10-CM

## 2013-11-01 DIAGNOSIS — Z1231 Encounter for screening mammogram for malignant neoplasm of breast: Secondary | ICD-10-CM

## 2013-12-18 ENCOUNTER — Other Ambulatory Visit: Payer: Self-pay | Admitting: Rheumatology

## 2013-12-18 DIAGNOSIS — M810 Age-related osteoporosis without current pathological fracture: Secondary | ICD-10-CM

## 2014-03-05 ENCOUNTER — Ambulatory Visit
Admission: RE | Admit: 2014-03-05 | Discharge: 2014-03-05 | Disposition: A | Discharge status: Home / Self Care | Payer: Federal, State, Local not specified - PPO | Source: Ambulatory Visit | Attending: Rheumatology | Admitting: Rheumatology

## 2014-03-05 DIAGNOSIS — M810 Age-related osteoporosis without current pathological fracture: Secondary | ICD-10-CM

## 2014-03-14 ENCOUNTER — Telehealth: Payer: Self-pay

## 2014-03-14 NOTE — Telephone Encounter (Signed)
Left a message for return call.  

## 2014-03-14 NOTE — Telephone Encounter (Signed)
-----   Message from Andrea Sherran Needs, DO sent at 03/14/2014  4:10 PM EST ----- Call pt - DEXA shows osteoporosis.  I suggest she follow up with me or rheumatologist to discuss treatment options.

## 2014-03-20 NOTE — Telephone Encounter (Signed)
Attempted to call pt at all numbers provided.  Released results to mychart.

## 2014-03-21 NOTE — Telephone Encounter (Signed)
Pt reviewed results on mychart on 2.16.15

## 2014-05-08 ENCOUNTER — Other Ambulatory Visit (INDEPENDENT_AMBULATORY_CARE_PROVIDER_SITE_OTHER): Payer: Federal, State, Local not specified - PPO

## 2014-05-08 DIAGNOSIS — Z0000 Encounter for general adult medical examination without abnormal findings: Secondary | ICD-10-CM | POA: Diagnosis not present

## 2014-05-08 LAB — COMPREHENSIVE METABOLIC PANEL
ALT: 38 U/L — ABNORMAL HIGH (ref 0–35)
AST: 32 U/L (ref 0–37)
Albumin: 4 g/dL (ref 3.5–5.2)
Alkaline Phosphatase: 66 U/L (ref 39–117)
BUN: 15 mg/dL (ref 6–23)
CO2: 33 mEq/L — ABNORMAL HIGH (ref 19–32)
Calcium: 9.7 mg/dL (ref 8.4–10.5)
Chloride: 100 mEq/L (ref 96–112)
Creatinine, Ser: 0.63 mg/dL (ref 0.40–1.20)
GFR: 102.29 mL/min (ref >60.00)
Glucose, Bld: 77 mg/dL (ref 70–99)
Potassium: 4.2 mEq/L (ref 3.5–5.1)
Sodium: 137 mEq/L (ref 135–145)
Total Bilirubin: 0.6 mg/dL (ref 0.2–1.2)
Total Protein: 7.2 g/dL (ref 6.0–8.3)

## 2014-05-08 LAB — LIPID PANEL
Cholesterol: 232 mg/dL — ABNORMAL HIGH (ref 0–200)
HDL: 62 mg/dL (ref >39.00)
LDL Cholesterol: 152 mg/dL — ABNORMAL HIGH (ref 0–99)
NonHDL: 170
Total CHOL/HDL Ratio: 4
Triglycerides: 92 mg/dL (ref 0.0–149.0)
VLDL: 18.4 mg/dL (ref 0.0–40.0)

## 2014-05-08 LAB — CBC WITH DIFFERENTIAL/PLATELET
Basophils Absolute: 0 10*3/uL (ref 0.0–0.1)
Basophils Relative: 0.9 % (ref 0.0–3.0)
Eosinophils Absolute: 0.1 10*3/uL (ref 0.0–0.7)
Eosinophils Relative: 2.9 % (ref 0.0–5.0)
HCT: 37.3 % (ref 36.0–46.0)
Hemoglobin: 12.8 g/dL (ref 12.0–15.0)
Lymphocytes Relative: 29.2 % (ref 12.0–46.0)
Lymphs Abs: 0.9 10*3/uL (ref 0.7–4.0)
MCHC: 34.2 g/dL (ref 30.0–36.0)
MCV: 92.7 fl (ref 78.0–100.0)
Monocytes Absolute: 0.4 10*3/uL (ref 0.1–1.0)
Monocytes Relative: 12.6 % — ABNORMAL HIGH (ref 3.0–12.0)
Neutro Abs: 1.7 10*3/uL (ref 1.4–7.7)
Neutrophils Relative %: 54.4 % (ref 43.0–77.0)
Platelets: 306 10*3/uL (ref 150.0–400.0)
RBC: 4.02 Mil/uL (ref 3.87–5.11)
RDW: 13.6 % (ref 11.5–15.5)
WBC: 3.2 10*3/uL — ABNORMAL LOW (ref 4.0–10.5)

## 2014-05-08 LAB — VITAMIN D 25 HYDROXY (VIT D DEFICIENCY, FRACTURES): VITD: 44.55 ng/mL (ref 30.00–100.00)

## 2014-05-08 LAB — TSH: TSH: 2.15 u[IU]/mL (ref 0.35–4.50)

## 2014-05-13 ENCOUNTER — Encounter: Payer: Self-pay | Admitting: Internal Medicine

## 2014-05-13 ENCOUNTER — Ambulatory Visit (INDEPENDENT_AMBULATORY_CARE_PROVIDER_SITE_OTHER): Payer: Federal, State, Local not specified - PPO | Admitting: Internal Medicine

## 2014-05-13 VITALS — BP 114/72 | HR 83 | Temp 98.2°F | Ht 61.75 in | Wt 114.0 lb

## 2014-05-13 DIAGNOSIS — Z0000 Encounter for general adult medical examination without abnormal findings: Secondary | ICD-10-CM

## 2014-05-13 DIAGNOSIS — G8929 Other chronic pain: Secondary | ICD-10-CM

## 2014-05-13 DIAGNOSIS — M25511 Pain in right shoulder: Secondary | ICD-10-CM | POA: Diagnosis not present

## 2014-05-13 DIAGNOSIS — M545 Low back pain, unspecified: Secondary | ICD-10-CM | POA: Insufficient documentation

## 2014-05-13 DIAGNOSIS — M069 Rheumatoid arthritis, unspecified: Secondary | ICD-10-CM | POA: Diagnosis not present

## 2014-05-13 DIAGNOSIS — Z23 Encounter for immunization: Secondary | ICD-10-CM

## 2014-05-13 HISTORY — DX: Low back pain, unspecified: M54.50

## 2014-05-13 HISTORY — DX: Other chronic pain: G89.29

## 2014-05-13 LAB — POCT URINALYSIS DIPSTICK
Blood, UA: NEGATIVE
Leukocytes, UA: NEGATIVE
Nitrite, UA: NEGATIVE
Protein, UA: NEGATIVE
Spec Grav, UA: 1.02
Urobilinogen, UA: 0.2
pH, UA: 6

## 2014-05-13 NOTE — Assessment & Plan Note (Signed)
Degenerative disease vs tendinitis.  Obtain right shoulder x ray

## 2014-05-13 NOTE — Patient Instructions (Signed)
Please follow up with Dr. Dareen Piano about the following issues: Shingles vaccine Osteoporosis treatment Blood test abnormalities

## 2014-05-13 NOTE — Assessment & Plan Note (Signed)
Followed at managed by rheumatologist.  She is on MTX.  Her lower extremity reflexes are brisk.  Low threshold for cervical imaging should she develop neck symptoms.

## 2014-05-13 NOTE — Assessment & Plan Note (Signed)
Reviewed adult health maintenance protocols.  Patient defers screening colonoscopy.  Check iFOB.  Patient updated with Tdap and pneumovax.  Patient will discuss treatment options for osteopenia porosis with her rheumatologist.  Her LDL is elevated. However I doubt she'll tolerate statin therapy considering her history of Sjogren's syndrome and possible fibromyalgia. Continue low saturated fat diet.

## 2014-05-13 NOTE — Progress Notes (Signed)
Subjective:    Patient ID: Andrea Fields, female    DOB: 10/05/1955, 59 y.o.   MRN: [id number]  HPI  59 year old white female with history of Sjogren's syndrome and rheumatoid arthritis for routine physical. Patient followed by Dr. Ouida Sills (rheumatology). Patient currently taking methotrexate. She reports her blood work monitored regularly.  She has history of osteoporosis. She was tried on Boniva but could not tolerate musculoskeletal side effects. She plans to further discuss treatment options with her rheumatologist.  Screening blood work reviewed. She has elevated LDL at 152.  Patient never went through with colonoscopy as planned. She defers referral to gastroenterology. She elects to complete iFOB this year.  Patient's husband diagnosed with metastatic prostate cancer.  Patient complains of intermittent low back pain. This has been ongoing for at least 12 months. She denies any trauma or injury.  Low back pain does not radiate into her legs. She denies any lower extremity weakness.  She also complains of intermittent right shoulder pain. No difficulty with right arm abduction. Some discomfort with internal rotation.  She takes Aleve as needed with food.  Her kidney function is stable.  She denies abdominal pain.  Review of Systems  Constitutional: Negative for activity change, appetite change and unexpected weight change.  Eyes: Negative for visual disturbance.  Respiratory: Negative for cough, chest tightness and shortness of breath.  Cardiovascular: Negative for chest pain.  Genitourinary: Negative for difficulty urinating.  Neurological: Negative for headaches.  Gastrointestinal: Negative for abdominal pain, heartburn melena or hematochezia Psych: Negative for depression or anxiety        Past Medical History  Diagnosis Date  . Anemia   . Arthritis   . DCIS (ductal carcinoma in situ)   . Sjogren's syndrome   . Rheumatoid arthritis(714.0)     History   Social  History  . Marital Status: Married    Spouse Name: N/A  . Number of Children: 2  . Years of Education: N/A   Occupational History  . RN [employer]   Social History Main Topics  . Smoking status: Former Smoker    Types: Cigarettes  . Smokeless tobacco: Never Used     Comment: 1 cigarette per day  . Alcohol Use: Yes  . Drug Use: No  . Sexual Activity: Not on file   Other Topics Concern  . Not on file   Social History Narrative    Past Surgical History  Procedure Laterality Date  . Breast surgery  1992, 2002    biopsy  . Mouth ranula excision  1996  . Laparoscopy  1990  . Orif foot fracture  2007    left 5th metatarsal  . Abcess drainage  2012    sinus    Family History  Problem Relation Age of Onset  . Hyperlipidemia Mother   . Diabetes Mother   . Arthritis Father   . Hyperlipidemia Father   . Cancer Maternal Aunt     breast  . Arthritis Maternal Grandmother   . Arthritis Maternal Grandfather   . Arthritis Paternal Grandmother   . Diabetes Paternal Grandmother   . Arthritis Paternal Grandfather     Allergies  Allergen Reactions  . Vectrin [Minocycline Hcl]     Current Outpatient Prescriptions on File Prior to Visit  Medication Sig Dispense Refill  . escitalopram (LEXAPRO) 10 MG tablet Take 10 mg by mouth daily.    . fish oil-omega-3 fatty acids 1000 MG capsule Take 2 g by mouth daily.      Marland Kitchen  Multiple Vitamin (MULTIVITAMIN) tablet Take 1 tablet by mouth daily.      . peg 3350 powder (MOVIPREP) 100 G SOLR Take 1 kit (100 g total) by mouth once. 1 kit 0  . Probiotic Product (PROBIOTIC DAILY PO) Take 1 capsule by mouth daily.    . vitamin C (ASCORBIC ACID) 500 MG tablet Take 500 mg by mouth daily.     No current facility-administered medications on file prior to visit.    BP 114/72 mmHg  Pulse 83  Temp(Src) 98.2 F (36.8 C) (Oral)  Ht 5' 1.75" (1.568 m)  Wt 114 lb (51.71 kg)  BMI 21.03 kg/m2    Objective:   Physical Exam  Constitutional: She  is oriented to person, place, and time. She appears well-developed and well-nourished. No distress.  HENT:  Head: Normocephalic and atraumatic.  Right Ear: External ear normal.  Left Ear: External ear normal.  Mouth/Throat: Oropharynx is clear and moist.  Eyes: Conjunctivae and EOM are normal. Pupils are equal, round, and reactive to light. No scleral icterus.  Neck: Neck supple.  Cardiovascular: Normal rate, regular rhythm and intact distal pulses.   No murmur heard. Pulmonary/Chest: Effort normal and breath sounds normal. She has no wheezes.  Abdominal: Bowel sounds are normal. There is no tenderness.  Musculoskeletal: She exhibits no edema.  Lymphadenopathy:    She has no cervical adenopathy.  Neurological: She is alert and oriented to person, place, and time. No cranial nerve deficit.  Patella reflex +3 bilaterally, achilles +2 bilaterally  Skin: Skin is warm and dry.  Psychiatric: She has a normal mood and affect. Her behavior is normal.          Assessment & Plan:

## 2014-05-13 NOTE — Assessment & Plan Note (Signed)
Patient complains of chronic intermittent low back pain. Her symptoms likely secondary to spondylosis.  Continue Aleve as needed.  Obtain lumbar xray to rule out compression fracture considering her history of osteoporosis.

## 2014-05-13 NOTE — Progress Notes (Signed)
Pre visit review using our clinic review tool, if applicable. No additional management support is needed unless otherwise documented below in the visit note. 

## 2014-05-14 ENCOUNTER — Ambulatory Visit (INDEPENDENT_AMBULATORY_CARE_PROVIDER_SITE_OTHER)
Admission: RE | Admit: 2014-05-14 | Discharge: 2014-05-14 | Disposition: A | Discharge status: Home / Self Care | Payer: Federal, State, Local not specified - PPO | Source: Ambulatory Visit | Attending: Internal Medicine | Admitting: Internal Medicine

## 2014-05-14 DIAGNOSIS — M545 Low back pain: Secondary | ICD-10-CM | POA: Diagnosis not present

## 2014-05-14 DIAGNOSIS — M25511 Pain in right shoulder: Secondary | ICD-10-CM

## 2014-05-15 ENCOUNTER — Other Ambulatory Visit: Payer: Self-pay | Admitting: Internal Medicine

## 2014-05-15 ENCOUNTER — Other Ambulatory Visit: Payer: Federal, State, Local not specified - PPO

## 2014-05-15 DIAGNOSIS — M25511 Pain in right shoulder: Secondary | ICD-10-CM

## 2014-05-17 LAB — FECAL OCCULT BLOOD, IMMUNOCHEMICAL: Fecal Occult Bld: NEGATIVE

## 2014-05-27 ENCOUNTER — Ambulatory Visit: Payer: Federal, State, Local not specified - PPO | Admitting: Family Medicine

## 2014-06-13 ENCOUNTER — Ambulatory Visit (INDEPENDENT_AMBULATORY_CARE_PROVIDER_SITE_OTHER): Payer: Federal, State, Local not specified - PPO | Admitting: Family Medicine

## 2014-06-13 ENCOUNTER — Other Ambulatory Visit (INDEPENDENT_AMBULATORY_CARE_PROVIDER_SITE_OTHER): Payer: Federal, State, Local not specified - PPO

## 2014-06-13 ENCOUNTER — Encounter: Payer: Self-pay | Admitting: Family Medicine

## 2014-06-13 VITALS — BP 110/72 | HR 90 | Ht 61.75 in | Wt 117.0 lb

## 2014-06-13 DIAGNOSIS — M25511 Pain in right shoulder: Secondary | ICD-10-CM

## 2014-06-13 DIAGNOSIS — M75101 Unspecified rotator cuff tear or rupture of right shoulder, not specified as traumatic: Secondary | ICD-10-CM | POA: Diagnosis not present

## 2014-06-13 NOTE — Progress Notes (Signed)
Andrea Fields [address] [place], [place] [address] Phone: [phone] Subjective:    I'm seeing this patient by the request  of:  Andrea Lemons, Andrea Fields   CC: Right shoulder pain  Andrea Fields is a 58 y.o. female coming in with complaint of right shoulder pain. She does have a past pedicle history significant for rheumatoid arthritis. Patient states that this pain in was more of an insidious onset. Patient denies that this occurred from any type of injury. Patient rates the severity of pain a 7 out of 10. Still able to Andrea Fields all activities of daily living. Patient describes it as a dull throbbing aching sensation came increased to a sharp pain especially with extension of the arm. Patient states reaching behind her back is very difficult. Denies any significant decrease in range of motion. Pain can hurt her at night as well. Denies any radiation down the arm or any associated neck pain. Denies any fevers chills or any abnormal weight loss.  Past Medical History  Diagnosis Date  . Anemia   . Arthritis   . DCIS (ductal carcinoma in situ)   . Sjogren's syndrome   . Rheumatoid arthritis(714.0)    Past Surgical History  Procedure Laterality Date  . Breast surgery  1992, 2002    biopsy  . Mouth ranula excision  1996  . Laparoscopy  1990  . Orif foot fracture  2007    left 5th metatarsal  . Abcess drainage  2012    sinus   History  Substance Use Topics  . Smoking status: Former Smoker    Types: Cigarettes  . Smokeless tobacco: Never Used     Comment: 1 cigarette per day  . Alcohol Use: Yes   Allergies  Allergen Reactions  . Vectrin [Minocycline Hcl]    Family History  Problem Relation Age of Onset  . Hyperlipidemia Mother   . Diabetes Mother   . Arthritis Father   . Hyperlipidemia Father   . Cancer Maternal Aunt     breast  . Arthritis Maternal Grandmother   . Arthritis Maternal Grandfather   . Arthritis Paternal Grandmother   .  Diabetes Paternal Grandmother   . Arthritis Paternal Grandfather         Past medical history, social, surgical and family history all reviewed in electronic medical record.   Review of Systems: No headache, visual changes, nausea, vomiting, diarrhea, constipation, dizziness, abdominal pain, skin rash, fevers, chills, night sweats, weight loss, swollen lymph nodes, body aches, joint swelling, muscle aches, chest pain, shortness of breath, mood changes.   Objective Blood pressure 110/72, pulse 90, height 5' 1.75" (1.568 m), weight 117 lb (53.071 kg), SpO2 96 %.  General: No apparent distress alert and oriented x3 mood and affect normal, dressed appropriately.  HEENT: Pupils equal, extraocular movements intact  Respiratory: Patient's speak in full sentences and does not appear short of breath  Cardiovascular: No lower extremity edema, non tender, no erythema  Skin: Warm dry intact with no signs of infection or rash on extremities or on axial skeleton.  Abdomen: Soft nontender  Neuro: Cranial nerves II through XII are intact, neurovascularly intact in all extremities with 2+ DTRs and 2+ pulses.  Lymph: No lymphadenopathy of posterior or anterior cervical chain or axillae bilaterally.  Gait normal with good balance and coordination.  MSK:  Non tender with full range of motion and good stability and symmetric strength and tone of  elbows, wrist, hip, knee  and ankles bilaterally.  Shoulder: Right Inspection reveals no abnormalities, atrophy or asymmetry. Palpation is normal with no tenderness over AC joint or bicipital groove. ROM is full in all planes passively. 4 out of 5 strength compared to 5 out of 5 on the contralateral side signs of impingement with positive Neer and Hawkin's tests, but negative empty can sign. Speeds and Yergason's tests normal. No labral pathology noted with negative Obrien's, negative clunk and good stability. Normal scapular function observed. No painful arc and  no drop arm sign. No apprehension sign  MSK US performed of: Right This study was ordered, performed, and interpreted by Terrilee Files D.O.  Shoulder:   Supraspinatus:  Appears normal on long and transverse views, patient does have a nodule near its insertion on the glenohumeral area. I Andrea Fields think that this is likely secondary to rheumatoid nodule based on the look with calcific changes. Bursal bulge seen with shoulder abduction on impingement view. Infraspinatus:  Appears normal on long and transverse views. Significant increase in Doppler flow Subscapularis:  Appears normal on long and transverse views. Positive bursa Teres Minor:  Appears normal on long and transverse views. AC joint:  Mild arthritis Glenohumeral Joint:  Appears normal without effusion. Glenoid Labrum:  Intact without visualized tears. Biceps Tendon:  Appears normal on long and transverse views, no fraying of tendon, tendon located in intertubercular groove, no subluxation with shoulder internal or external rotation.  Impression: Subacromial bursitis with what appears to be a rheumatoid nodule under these subscapularis.  Procedure note 97110; 15 minutes spent for Therapeutic exercises as stated in above notes.  This included exercises focusing on stretching, strengthening, with significant focus on eccentric aspects. Shoulder Exercises that included:  Basic scapular stabilization to include adduction and depression of scapula Scaption, focusing on proper movement and good control Internal and External rotation utilizing a theraband, with elbow tucked at side entire time Rows with theraband  Proper technique shown and discussed handout in great detail with ATC.  All questions were discussed and answered.     Impression and Recommendations:     This case required medical decision making of moderate complexity.

## 2014-06-13 NOTE — Progress Notes (Signed)
Pre visit review using our clinic review tool, if applicable. No additional management support is needed unless otherwise documented below in the visit note. 

## 2014-06-13 NOTE — Assessment & Plan Note (Signed)
Patient does have more of a rotator cuff syndrome. I do not see any true tear but patient does have a rheumatoid nodule noted. Patient elected try conservative therapy. Patient has gone to try topical anti-inflammatories, icing protocol, and we discussed over-the-counter natural supplementations a could be beneficial. Patient will try to make these changes and come back and see me again in 3 weeks. Continuing to have discomfort we may need to consider injection as well as formal physical therapy.

## 2014-06-13 NOTE — Patient Instructions (Signed)
Good to see you.  Ice 20 minutes 2 times daily. Usually after activity and before bed. Exercises 3 times a week.  Vitamin D 4000 IU daily Turmeric 500mg  2 times daily pennsaid pinkie amount topically 2 times daily as needed.  See me again in 3 weeks.

## 2014-07-04 ENCOUNTER — Ambulatory Visit: Payer: Federal, State, Local not specified - PPO | Admitting: Family Medicine

## 2014-07-22 ENCOUNTER — Encounter: Payer: Self-pay | Admitting: Family Medicine

## 2014-07-22 ENCOUNTER — Ambulatory Visit (INDEPENDENT_AMBULATORY_CARE_PROVIDER_SITE_OTHER): Payer: Federal, State, Local not specified - PPO | Admitting: Family Medicine

## 2014-07-22 VITALS — BP 122/72 | HR 78 | Ht 61.75 in | Wt 117.0 lb

## 2014-07-22 DIAGNOSIS — M75101 Unspecified rotator cuff tear or rupture of right shoulder, not specified as traumatic: Secondary | ICD-10-CM | POA: Diagnosis not present

## 2014-07-22 NOTE — Progress Notes (Signed)
[name] [address] [place], [place] [address] Phone: [phone] Subjective:    I'm seeing this patient by the request  of:  Andrea Lemons, DO   CC: Right shoulder pain follow up  Andrea Fields is a 59 y.o. female coming in with complaint of right shoulder pain. Patient was seen previously and she was diagnosed with a subacromial bursitis as well as a rheumatoid nodule. Patient elected try conservative therapy. We discussed over-the-counter natural supplementation, icing protocol, home exercises. Patient states he has made improvement. Would state that she is about 80% better. Patient states only no certain movements seem to be giving her pain. Denies that this is stopping her from daily activities. States that she's comfortable at night. States that only reach behind her back seems to be the only painful pain. Sharp pain that goes away after seconds.  Past Medical History  Diagnosis Date  . Anemia   . Arthritis   . DCIS (ductal carcinoma in situ)   . Sjogren's syndrome   . Rheumatoid arthritis(714.0)    Past Surgical History  Procedure Laterality Date  . Breast surgery  1992, 2002    biopsy  . Mouth ranula excision  1996  . Laparoscopy  1990  . Orif foot fracture  2007    left 5th metatarsal  . Abcess drainage  2012    sinus   History  Substance Use Topics  . Smoking status: Former Smoker    Types: Cigarettes  . Smokeless tobacco: Never Used     Comment: 1 cigarette per day  . Alcohol Use: Yes   Allergies  Allergen Reactions  . Vectrin [Minocycline Hcl]    Family History  Problem Relation Age of Onset  . Hyperlipidemia Mother   . Diabetes Mother   . Arthritis Father   . Hyperlipidemia Father   . Cancer Maternal Aunt     breast  . Arthritis Maternal Grandmother   . Arthritis Maternal Grandfather   . Arthritis Paternal Grandmother   . Diabetes Paternal Grandmother   . Arthritis Paternal Grandfather          Past medical history, social, surgical and family history all reviewed in electronic medical record.   Review of Systems: No headache, visual changes, nausea, vomiting, diarrhea, constipation, dizziness, abdominal pain, skin rash, fevers, chills, night sweats, weight loss, swollen lymph nodes, body aches, joint swelling, muscle aches, chest pain, shortness of breath, mood changes.   Objective There were no vitals taken for this visit.  General: No apparent distress alert and oriented x3 mood and affect normal, dressed appropriately.  HEENT: Pupils equal, extraocular movements intact  Respiratory: Patient's speak in full sentences and does not appear short of breath  Cardiovascular: No lower extremity edema, non tender, no erythema  Skin: Warm dry intact with no signs of infection or rash on extremities or on axial skeleton.  Abdomen: Soft nontender  Neuro: Cranial nerves II through XII are intact, neurovascularly intact in all extremities with 2+ DTRs and 2+ pulses.  Lymph: No lymphadenopathy of posterior or anterior cervical chain or axillae bilaterally.  Gait normal with good balance and coordination.  MSK:  Non tender with full range of motion and good stability and symmetric strength and tone of  elbows, wrist, hip, knee and ankles bilaterally.  Shoulder: Right Inspection reveals no abnormalities, atrophy or asymmetry. Palpation is normal with no tenderness over AC joint or bicipital groove. ROM is full in all planes passively. 4+  out of 5 strength compared to 5 out of 5 on the contralateral sidethis is an improvement signs of impingement with positive Neer and Hawkin's tests, but negative empty can sign still present but minor improvement Speeds and Yergason's tests normal. No labral pathology noted with negative Obrien's, negative clunk and good stability. Normal scapular function observed. No painful arc and no drop arm sign. No apprehension sign        Impression and  Recommendations:     This case required medical decision making of moderate complexity.

## 2014-07-22 NOTE — Progress Notes (Signed)
Pre visit review using our clinic review tool, if applicable. No additional management support is needed unless otherwise documented below in the visit note. 

## 2014-07-22 NOTE — Patient Instructions (Addendum)
Great to see you Give yourself a pat on the back you are doing well.  Ice is your friend Continue the exercises 3 times a week.    Consider talking to your other providers about gabapentin or effexor If shoulder worsens you know where I am  Contact me if you have questions See me when you need me.

## 2014-07-22 NOTE — Assessment & Plan Note (Signed)
She overall is doing relatively well. Patient is concerned that she may have of fibromyalgia and is following up with the [facility]. She has been increased on her Cymbalta recently discuss other adjunct treatment options.  Patient will continue to see her other providers and make changes as necessary. Patient has any worsening shoulder pain and she will see me again. Discussed with her that she can come back and see me on an as-needed basis as long as she continues to be happy with the results.

## 2014-07-30 ENCOUNTER — Encounter: Payer: Self-pay | Admitting: Internal Medicine

## 2014-11-21 ENCOUNTER — Other Ambulatory Visit: Payer: Self-pay

## 2014-11-21 DIAGNOSIS — Z1231 Encounter for screening mammogram for malignant neoplasm of breast: Secondary | ICD-10-CM

## 2014-12-03 ENCOUNTER — Emergency Department (INDEPENDENT_AMBULATORY_CARE_PROVIDER_SITE_OTHER)
Admission: EM | Admit: 2014-12-03 | Discharge: 2014-12-03 | Disposition: A | Discharge status: Home / Self Care | Payer: Federal, State, Local not specified - PPO | Source: Home / Self Care | Attending: Emergency Medicine | Admitting: Emergency Medicine

## 2014-12-03 ENCOUNTER — Encounter (HOSPITAL_COMMUNITY): Payer: Self-pay | Admitting: Emergency Medicine

## 2014-12-03 DIAGNOSIS — S161XXA Strain of muscle, fascia and tendon at neck level, initial encounter: Secondary | ICD-10-CM | POA: Diagnosis not present

## 2014-12-03 DIAGNOSIS — S2020XA Contusion of thorax, unspecified, initial encounter: Secondary | ICD-10-CM | POA: Diagnosis not present

## 2014-12-03 DIAGNOSIS — S20219A Contusion of unspecified front wall of thorax, initial encounter: Secondary | ICD-10-CM

## 2014-12-03 MED ORDER — DICLOFENAC SODIUM 1 % TD GEL: 1.0000 "application " | Freq: Four times a day (QID) | TRANSDERMAL | Status: DC
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
## 2014-12-12 ENCOUNTER — Ambulatory Visit
Admission: RE | Admit: 2014-12-12 | Discharge: 2014-12-12 | Disposition: A | Discharge status: Home / Self Care | Payer: Federal, State, Local not specified - PPO | Source: Ambulatory Visit

## 2014-12-12 DIAGNOSIS — Z1231 Encounter for screening mammogram for malignant neoplasm of breast: Secondary | ICD-10-CM

## 2014-12-17 ENCOUNTER — Encounter: Payer: Self-pay | Admitting: Family Medicine

## 2014-12-17 ENCOUNTER — Ambulatory Visit (INDEPENDENT_AMBULATORY_CARE_PROVIDER_SITE_OTHER): Payer: Federal, State, Local not specified - PPO | Admitting: Family Medicine

## 2014-12-17 VITALS — BP 110/72 | HR 82 | Temp 98.3°F | Ht 61.75 in | Wt 119.9 lb

## 2014-12-17 DIAGNOSIS — R51 Headache: Secondary | ICD-10-CM

## 2014-12-17 DIAGNOSIS — M546 Pain in thoracic spine: Secondary | ICD-10-CM | POA: Diagnosis not present

## 2014-12-17 DIAGNOSIS — M549 Dorsalgia, unspecified: Secondary | ICD-10-CM

## 2014-12-17 DIAGNOSIS — R519 Headache, unspecified: Secondary | ICD-10-CM

## 2014-12-17 DIAGNOSIS — M542 Cervicalgia: Secondary | ICD-10-CM | POA: Diagnosis not present

## 2014-12-17 MED ORDER — TIZANIDINE HCL 2 MG PO TABS: 2.0000 mg | ORAL_TABLET | Freq: Three times a day (TID) | ORAL | Status: DC | PRN
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
## 2014-12-17 NOTE — Progress Notes (Signed)
Pre visit review using our clinic review tool, if applicable. No additional management support is needed unless otherwise documented below in the visit note. 

## 2014-12-17 NOTE — Patient Instructions (Signed)
BEFORE YOU LEAVE: -neck spasm exercises  Use the muscle relaxer at night for 1-2 weeks  Aleve or tylenol per instructions as needed  No aerobic physical activity for 1-2 weeks until headaches resolve  Heat for 15 minutes twice daily  Follow up in 3-4 weeks if worsening or not significantly better or other cnocerns

## 2015-01-07 ENCOUNTER — Ambulatory Visit: Payer: Federal, State, Local not specified - PPO | Admitting: Family Medicine

## 2015-05-06 DIAGNOSIS — M810 Age-related osteoporosis without current pathological fracture: Secondary | ICD-10-CM | POA: Diagnosis not present

## 2015-05-06 DIAGNOSIS — Z79899 Other long term (current) drug therapy: Secondary | ICD-10-CM | POA: Diagnosis not present

## 2015-05-06 DIAGNOSIS — E559 Vitamin D deficiency, unspecified: Secondary | ICD-10-CM | POA: Diagnosis not present

## 2015-05-06 DIAGNOSIS — M3500 Sicca syndrome, unspecified: Secondary | ICD-10-CM | POA: Diagnosis not present

## 2015-05-06 DIAGNOSIS — M0589 Other rheumatoid arthritis with rheumatoid factor of multiple sites: Secondary | ICD-10-CM | POA: Diagnosis not present

## 2015-06-03 ENCOUNTER — Other Ambulatory Visit: Payer: Self-pay | Admitting: Rheumatology

## 2015-06-03 DIAGNOSIS — M8580 Other specified disorders of bone density and structure, unspecified site: Secondary | ICD-10-CM

## 2015-06-04 DIAGNOSIS — F331 Major depressive disorder, recurrent, moderate: Secondary | ICD-10-CM | POA: Diagnosis not present

## 2015-06-06 ENCOUNTER — Telehealth: Payer: Self-pay | Admitting: Internal Medicine

## 2015-06-06 NOTE — Telephone Encounter (Signed)
Pt really would like to see dr Selena Batten as new patient. This pt has seen dr Selena Batten in nov 2016. This will be transfer from dr Artist Pais. Can I sch?

## 2015-06-06 NOTE — Telephone Encounter (Signed)
Ok with me. Could she come for NPV today? We had some cancellations and have some openings - can block 2 15 minutes slots if needed. Thanks.

## 2015-06-06 NOTE — Telephone Encounter (Signed)
Left vm to return our call 

## 2015-06-09 NOTE — Telephone Encounter (Signed)
I left a detailed message at the pts cell number Dr Selena Batten agreed to see her a new patient and to call the office to schedule an appt when she would like to.

## 2015-06-16 ENCOUNTER — Ambulatory Visit (INDEPENDENT_AMBULATORY_CARE_PROVIDER_SITE_OTHER)
Admission: RE | Admit: 2015-06-16 | Discharge: 2015-06-16 | Disposition: A | Discharge status: Home / Self Care | Payer: Federal, State, Local not specified - PPO | Source: Ambulatory Visit | Attending: Family Medicine | Admitting: Family Medicine

## 2015-06-16 ENCOUNTER — Encounter: Payer: Self-pay | Admitting: Family Medicine

## 2015-06-16 ENCOUNTER — Ambulatory Visit (INDEPENDENT_AMBULATORY_CARE_PROVIDER_SITE_OTHER): Payer: Federal, State, Local not specified - PPO | Admitting: Family Medicine

## 2015-06-16 VITALS — BP 120/68 | HR 92 | Temp 98.4°F | Ht 61.75 in | Wt 120.3 lb

## 2015-06-16 DIAGNOSIS — S8262XA Displaced fracture of lateral malleolus of left fibula, initial encounter for closed fracture: Secondary | ICD-10-CM | POA: Diagnosis not present

## 2015-06-16 DIAGNOSIS — M79672 Pain in left foot: Secondary | ICD-10-CM | POA: Diagnosis not present

## 2015-06-16 DIAGNOSIS — M25572 Pain in left ankle and joints of left foot: Secondary | ICD-10-CM

## 2015-06-16 NOTE — Patient Instructions (Signed)
BEFORE YOU LEAVE: -xray sheet -keep npv as scheduled and for follow up  Get the xrays.  Tylenol, elevation and topical sports creams as needed. Further recommendations pending xray results.

## 2015-06-16 NOTE — Progress Notes (Signed)
HPI:  Andrea Fields is a very pleasant 60 yo here for an acute visit for:  Ankle pain: -L lat ankle -s/p inversion ankle sprain from twisting/falling on uneven concrete 2 weeks ago -Was able to bear weight immediately, but with pain -Had some swelling around the lateral ankle with pain in the lateral ankle and at the fifth metatarsal head -Denies weakness, numbness, significant bruising or redness, inability to bear weight -She has a past medical history significant for fracture of the fifth metatarsal head with pain placed greater than 10 years ago and rheumatoid arthritis, sees Dr. Kathi Fields for this  ROS: See pertinent positives and negatives per HPI.  Past Medical History  Diagnosis Date  . Anemia   . Arthritis   . DCIS (ductal carcinoma in situ)   . Sjogren's syndrome (HCC)   . Rheumatoid arthritis(714.0)     Past Surgical History  Procedure Laterality Date  . Breast surgery  1992, 2002    biopsy  . Mouth ranula excision  1996  . Laparoscopy  1990  . Orif foot fracture  2007    left 5th metatarsal  . Abcess drainage  2012    sinus    Family History  Problem Relation Age of Onset  . Hyperlipidemia Mother   . Diabetes Mother   . Arthritis Father   . Hyperlipidemia Father   . Cancer Maternal Aunt     breast  . Arthritis Maternal Grandmother   . Arthritis Maternal Grandfather   . Arthritis Paternal Grandmother   . Diabetes Paternal Grandmother   . Arthritis Paternal Grandfather     Social History   Social History  . Marital Status: Married    Spouse Name: N/A  . Number of Children: 2  . Years of Education: N/A   Occupational History  . RN [employer]   Social History Main Topics  . Smoking status: Former Smoker    Types: Cigarettes  . Smokeless tobacco: Never Used     Comment: 1 cigarette per day  . Alcohol Use: Yes  . Drug Use: No  . Sexual Activity: Not Asked   Other Topics Concern  . None   Social History Narrative     Current outpatient  prescriptions:  .  Cholecalciferol (VITAMIN D PO), Take 2,000 Units by mouth daily. , Disp: , Rfl:  .  diclofenac sodium (VOLTAREN) 1 % GEL, Apply 1 application topically 4 (four) times daily., Disp: 100 g, Rfl: 0 .  DULoxetine (CYMBALTA) 30 MG capsule, TAKE 3 CAPSULES BY MOUTH IN THE MORNING, Disp: , Rfl: 1 .  fish oil-omega-3 fatty acids 1000 MG capsule, Take 2 g by mouth daily.  , Disp: , Rfl:  .  folic acid (FOLVITE) 1 MG tablet, Take 1 tablet by mouth daily., Disp: , Rfl:  .  methotrexate (RHEUMATREX) 15 MG tablet, Take 15 mg by mouth once a week. Caution: Chemotherapy. Protect from light., Disp: , Rfl:  .  Multiple Vitamin (MULTIVITAMIN) tablet, Take 1 tablet by mouth daily.  , Disp: , Rfl:  .  naproxen sodium (ANAPROX) 220 MG tablet, Take 220 mg by mouth as needed. , Disp: , Rfl:  .  NON FORMULARY, Tumeric, Disp: , Rfl:  .  Probiotic Product (PROBIOTIC DAILY PO), Take 1 capsule by mouth daily., Disp: , Rfl:  .  tiZANidine (ZANAFLEX) 2 MG tablet, Take 1 tablet (2 mg total) by mouth every 8 (eight) hours as needed for muscle spasms., Disp: 30 tablet, Rfl: 0 .  vitamin C (ASCORBIC ACID) 500 MG tablet, Take 500 mg by mouth daily., Disp: , Rfl:   EXAM:  Filed Vitals:   06/16/15 1355  BP: 120/68  Pulse: 92  Temp: 98.4 F (36.9 C)    Body mass index is 22.19 kg/(m^2).  GENERAL: vitals reviewed and listed above, alert, oriented, appears well hydrated and in no acute distress  HEENT: atraumatic, conjunttiva clear, no obvious abnormalities on inspection of external nose and ears  NECK: no obvious masses on inspection  LUNGS: clear to auscultation bilaterally, no wheezes, rales or rhonchi, good air movement  CV: HRRR, no peripheral edema  MS: moves all extremities without noticeable abnormality; normal gait, minimal swelling around the left lateral malleolus inferiorly, tenderness to palpation at the posterior lateral malleolus and the fifth metatarsal head along with less tenderness  to palpation at the left medial malleolus. No redness or erythema. No warmth. Negative talar tilt and drawer testing. Neurovascularly intact distally.  PSYCH: pleasant and cooperative, no obvious depression or anxiety  ASSESSMENT AND PLAN:  Discussed the following assessment and plan:  Left ankle pain - Plan: DG Ankle Complete Left  Left foot pain - Plan: DG Foot Complete Left  -Plain films per orders -She prefers to see Dr. Victorino Fields if there is a fracture -Supportive care in the interim -We'll plan to follow up at her new patient visit in June, sooner if needed -Patient advised to return or notify a doctor immediately if symptoms worsen or persist or new concerns arise.  There are no Patient Instructions on file for this visit.   [name]

## 2015-06-16 NOTE — Progress Notes (Signed)
Pre visit review using our clinic review tool, if applicable. No additional management support is needed unless otherwise documented below in the visit note. 

## 2015-06-18 ENCOUNTER — Ambulatory Visit
Admission: RE | Admit: 2015-06-18 | Discharge: 2015-06-18 | Disposition: A | Discharge status: Home / Self Care | Payer: Federal, State, Local not specified - PPO | Source: Ambulatory Visit | Attending: Rheumatology | Admitting: Rheumatology

## 2015-06-18 ENCOUNTER — Other Ambulatory Visit: Payer: Federal, State, Local not specified - PPO

## 2015-06-18 DIAGNOSIS — S93402A Sprain of unspecified ligament of left ankle, initial encounter: Secondary | ICD-10-CM | POA: Diagnosis not present

## 2015-06-18 DIAGNOSIS — S82892A Other fracture of left lower leg, initial encounter for closed fracture: Secondary | ICD-10-CM | POA: Diagnosis not present

## 2015-06-18 DIAGNOSIS — M8589 Other specified disorders of bone density and structure, multiple sites: Secondary | ICD-10-CM | POA: Diagnosis not present

## 2015-06-18 DIAGNOSIS — M8580 Other specified disorders of bone density and structure, unspecified site: Secondary | ICD-10-CM

## 2015-06-18 DIAGNOSIS — Z780 Asymptomatic menopausal state: Secondary | ICD-10-CM | POA: Diagnosis not present

## 2015-06-23 DIAGNOSIS — S93402A Sprain of unspecified ligament of left ankle, initial encounter: Secondary | ICD-10-CM | POA: Diagnosis not present

## 2015-07-03 ENCOUNTER — Encounter: Payer: Self-pay | Admitting: Family Medicine

## 2015-07-03 ENCOUNTER — Ambulatory Visit (INDEPENDENT_AMBULATORY_CARE_PROVIDER_SITE_OTHER): Payer: Federal, State, Local not specified - PPO | Admitting: Family Medicine

## 2015-07-03 VITALS — BP 118/80 | HR 69 | Temp 98.2°F | Ht 61.75 in | Wt 117.1 lb

## 2015-07-03 DIAGNOSIS — F32A Depression, unspecified: Secondary | ICD-10-CM

## 2015-07-03 DIAGNOSIS — M3500 Sicca syndrome, unspecified: Secondary | ICD-10-CM

## 2015-07-03 DIAGNOSIS — S93402A Sprain of unspecified ligament of left ankle, initial encounter: Secondary | ICD-10-CM | POA: Diagnosis not present

## 2015-07-03 DIAGNOSIS — F419 Anxiety disorder, unspecified: Secondary | ICD-10-CM

## 2015-07-03 DIAGNOSIS — F418 Other specified anxiety disorders: Secondary | ICD-10-CM | POA: Diagnosis not present

## 2015-07-03 DIAGNOSIS — M810 Age-related osteoporosis without current pathological fracture: Secondary | ICD-10-CM | POA: Diagnosis not present

## 2015-07-03 DIAGNOSIS — M0609 Rheumatoid arthritis without rheumatoid factor, multiple sites: Secondary | ICD-10-CM

## 2015-07-03 DIAGNOSIS — F329 Major depressive disorder, single episode, unspecified: Secondary | ICD-10-CM | POA: Insufficient documentation

## 2015-07-03 HISTORY — DX: Anxiety disorder, unspecified: F41.9

## 2015-07-03 HISTORY — DX: Age-related osteoporosis without current pathological fracture: M81.0

## 2015-07-03 HISTORY — DX: Depression, unspecified: F32.A

## 2015-07-03 NOTE — Progress Notes (Signed)
HPI:  Andrea Fields is here to establish care.  Last PCP and physical: sees Dr. Benjie Karvonen in gyn for gynecologist appointments and exams.   Has the following chronic problems that require follow up and concerns today:  L ankle pain: -started beginning of may after twisting/falling on concrete, inversion -plain films w/ tiny lat malleolus fx, referred to specialist - Dr. Doran Durand -doing better, doing physical therapy -considering tx for bunion as well  RA/Sjogren's/Osteoporosis/?fibromyalgia: -sees Dr. Dossie Der, [facility], Rheumatology -had labs recently: ccp and RF positive, ESR elevated; cbc, cnp and vit D ok - she wonders about my thoughts on these labs, has follow up with rheumatologist -apparently had negative RA testing in the past -meds: on methotrexate, Vit D3, cymbalta -did not tolerate boniva, did dexa with Dr. Dossie Der recently, but not contacted with results  Depression and Anxiety: -more stress and anxiety then depression, Sees [facility] Clines in Dr. 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 DCIS (ductal carcinoma in situ)     1992 dx during pregnancy, s/p lumpectomy remotely and no further treatment; benign biopsy in 2002  . Sjogren's syndrome ([place])     Sees Rheumatologist  . Rheumatoid arthritis(714.0)     Dx in 2017, seeing Dr. Dossie Der, Rheumatologist  . Anemia     pt does not recall having this, CBC 05/2015 normal  . Anxiety and depression 07/03/2015    -sees psychiatry, [facility] Clines   . Osteoporosis 07/03/2015    -seeing Dr. Dossie Der -did not tolerate boniva   . Chronic low back  pain 05/13/2014    Past Surgical History  Procedure Laterality Date  . Breast surgery  1992, 2002    biopsy  . Mouth ranula excision  1996  . Laparoscopy  1990  . Orif foot fracture  2007    left 5th metatarsal  . Abcess drainage  2012    sinus    Family History  Problem Relation Age of Onset  . Hyperlipidemia Mother   . Diabetes Mother   . Arthritis Father   . Hyperlipidemia Father   . Cancer Maternal Aunt     breast  . Arthritis Maternal Grandmother   . Arthritis Maternal Grandfather   . Arthritis Paternal Grandmother   . Diabetes Paternal Grandmother   . Arthritis Paternal Grandfather     Social History   Social History  . Marital Status: Married    Spouse Name: N/A  . Number of Children: 2  . Years of Education: N/A   Occupational History  . RN [employer]   Social History Main Topics  . Smoking status: Former Smoker    Types: Cigarettes  . Smokeless tobacco: Never Used     Comment: 1 cigarette per day  . Alcohol Use: Yes  . Drug Use: No  . Sexual Activity: Not Asked   Other Topics Concern  . None   Social History Narrative     Current outpatient prescriptions:  .  Cholecalciferol (VITAMIN D PO), Take 2,000 Units by mouth daily. , Disp: , Rfl:  .  diclofenac sodium (VOLTAREN) 1 % GEL, Apply 1 application topically 4 (four)  times daily., Disp: 100 g, Rfl: 0 .  DULoxetine (CYMBALTA) 30 MG capsule, TAKE 3 CAPSULES BY MOUTH IN THE MORNING, Disp: , Rfl: 1 .  fish oil-omega-3 fatty acids 1000 MG capsule, Take 2 g by mouth daily.  , Disp: , Rfl:  .  folic acid (FOLVITE) 1 MG tablet, Take 2 tablets by mouth daily. , Disp: , Rfl:  .  methotrexate (RHEUMATREX) 15 MG tablet, Take 15 mg by mouth once a week. Caution: Chemotherapy. Protect from light., Disp: , Rfl:  .  Multiple Vitamin (MULTIVITAMIN) tablet, Take 1 tablet by mouth daily.  , Disp: , Rfl:  .  naproxen sodium (ANAPROX) 220 MG tablet, Take 220 mg by mouth as needed. , Disp: , Rfl:  .  NON  FORMULARY, Tumeric 589m twice a day, Disp: , Rfl:  .  Probiotic Product (PROBIOTIC DAILY PO), Take 1 capsule by mouth daily., Disp: , Rfl:  .  vitamin C (ASCORBIC ACID) 500 MG tablet, Take 500 mg by mouth daily., Disp: , Rfl:   EXAM:  Filed Vitals:   07/03/15 1326  BP: 118/80  Pulse: 69  Temp: 98.2 F (36.8 C)    Body mass index is 21.6 kg/(m^2).  GENERAL: vitals reviewed and listed above, alert, oriented, appears well hydrated and in no acute distress  HEENT: atraumatic, conjunttiva clear, no obvious abnormalities on inspection of external nose and ears  NECK: no obvious masses on inspection  LUNGS: clear to auscultation bilaterally, no wheezes, rales or rhonchi, good air movement  CV: HRRR, no peripheral edema  MS: moves all extremities without noticeable abnormality  PSYCH: pleasant and cooperative, no obvious depression or anxiety  ASSESSMENT AND PLAN:  Discussed the following assessment and plan:  Osteoporosis  Anxiety and depression  Rheumatoid arthritis of multiple sites with negative rheumatoid factor (HCC)  Sjogren's disease ([place]  -reviewed recent labs and dexa report - she plans to follow up with ordering provider, rheum for further discussion and managemen -We reviewed the PMH, PSH, FH, SH, Meds and Allergies. -We provided refills for any medications we will prescribe as needed. -We addressed current concerns per orders and patient instructions. -We have asked for records for pertinent exams, studies, vaccines and notes from previous providers. -We have advised patient to follow up per instructions below. -cpe in 3-4 months, will plan to do fasting labs then  -Patient advised to return or notify a doctor immediately if symptoms worsen or persist or new concerns arise.  There are no Patient Instructions on file for this visit.   KColin BentonR.

## 2015-07-03 NOTE — Progress Notes (Signed)
Pre visit review using our clinic review tool, if applicable. No additional management support is needed unless otherwise documented below in the visit note. 

## 2015-07-03 NOTE — Patient Instructions (Signed)
BEFORE YOU LEAVE: -physical in 3-4 month; come fasting for 8 hours - you can have water and black coffee  We recommend the following healthy lifestyle measures: - eat a healthy whole foods diet consisting of regular small meals composed of vegetables, fruits, beans, nuts, seeds, healthy meats such as white chicken and fish and whole grains.  - avoid sweets, white starchy foods, fried foods, fast food, processed foods, sodas, red meet and other fattening foods.  - get a least 150-300 minutes of aerobic exercise per week.

## 2015-07-07 DIAGNOSIS — S93402A Sprain of unspecified ligament of left ankle, initial encounter: Secondary | ICD-10-CM | POA: Diagnosis not present

## 2015-07-09 DIAGNOSIS — S93402A Sprain of unspecified ligament of left ankle, initial encounter: Secondary | ICD-10-CM | POA: Diagnosis not present

## 2015-07-16 DIAGNOSIS — S93402S Sprain of unspecified ligament of left ankle, sequela: Secondary | ICD-10-CM | POA: Diagnosis not present

## 2015-07-16 DIAGNOSIS — S93402A Sprain of unspecified ligament of left ankle, initial encounter: Secondary | ICD-10-CM | POA: Diagnosis not present

## 2015-07-16 DIAGNOSIS — S82892D Other fracture of left lower leg, subsequent encounter for closed fracture with routine healing: Secondary | ICD-10-CM | POA: Diagnosis not present

## 2015-07-21 DIAGNOSIS — S93402A Sprain of unspecified ligament of left ankle, initial encounter: Secondary | ICD-10-CM | POA: Diagnosis not present

## 2015-07-28 DIAGNOSIS — S93402A Sprain of unspecified ligament of left ankle, initial encounter: Secondary | ICD-10-CM | POA: Diagnosis not present

## 2015-08-07 DIAGNOSIS — Z79899 Other long term (current) drug therapy: Secondary | ICD-10-CM | POA: Diagnosis not present

## 2015-08-07 DIAGNOSIS — S93402A Sprain of unspecified ligament of left ankle, initial encounter: Secondary | ICD-10-CM | POA: Diagnosis not present

## 2015-08-07 DIAGNOSIS — M3500 Sicca syndrome, unspecified: Secondary | ICD-10-CM | POA: Diagnosis not present

## 2015-08-07 DIAGNOSIS — E559 Vitamin D deficiency, unspecified: Secondary | ICD-10-CM | POA: Diagnosis not present

## 2015-08-07 DIAGNOSIS — M810 Age-related osteoporosis without current pathological fracture: Secondary | ICD-10-CM | POA: Diagnosis not present

## 2015-08-14 DIAGNOSIS — S93402A Sprain of unspecified ligament of left ankle, initial encounter: Secondary | ICD-10-CM | POA: Diagnosis not present

## 2015-08-20 DIAGNOSIS — S93402A Sprain of unspecified ligament of left ankle, initial encounter: Secondary | ICD-10-CM | POA: Diagnosis not present

## 2015-08-27 DIAGNOSIS — S93402A Sprain of unspecified ligament of left ankle, initial encounter: Secondary | ICD-10-CM | POA: Diagnosis not present

## 2015-09-04 DIAGNOSIS — S93402A Sprain of unspecified ligament of left ankle, initial encounter: Secondary | ICD-10-CM | POA: Diagnosis not present

## 2015-09-25 DIAGNOSIS — S93402A Sprain of unspecified ligament of left ankle, initial encounter: Secondary | ICD-10-CM | POA: Diagnosis not present

## 2015-11-06 ENCOUNTER — Encounter: Payer: Federal, State, Local not specified - PPO | Admitting: Family Medicine

## 2015-11-14 ENCOUNTER — Encounter: Payer: Self-pay | Admitting: Family Medicine

## 2015-11-14 ENCOUNTER — Ambulatory Visit (INDEPENDENT_AMBULATORY_CARE_PROVIDER_SITE_OTHER): Payer: Federal, State, Local not specified - PPO | Admitting: Family Medicine

## 2015-11-14 VITALS — HR 74 | Temp 97.9°F | Wt 117.0 lb

## 2015-11-14 DIAGNOSIS — M3500 Sicca syndrome, unspecified: Secondary | ICD-10-CM | POA: Diagnosis not present

## 2015-11-14 DIAGNOSIS — F419 Anxiety disorder, unspecified: Secondary | ICD-10-CM

## 2015-11-14 DIAGNOSIS — M810 Age-related osteoporosis without current pathological fracture: Secondary | ICD-10-CM

## 2015-11-14 DIAGNOSIS — M0609 Rheumatoid arthritis without rheumatoid factor, multiple sites: Secondary | ICD-10-CM | POA: Diagnosis not present

## 2015-11-14 DIAGNOSIS — F418 Other specified anxiety disorders: Secondary | ICD-10-CM | POA: Diagnosis not present

## 2015-11-14 DIAGNOSIS — F32A Depression, unspecified: Secondary | ICD-10-CM

## 2015-11-14 DIAGNOSIS — E559 Vitamin D deficiency, unspecified: Secondary | ICD-10-CM | POA: Diagnosis not present

## 2015-11-14 DIAGNOSIS — Z0000 Encounter for general adult medical examination without abnormal findings: Secondary | ICD-10-CM

## 2015-11-14 DIAGNOSIS — F329 Major depressive disorder, single episode, unspecified: Secondary | ICD-10-CM

## 2015-11-14 DIAGNOSIS — Z79899 Other long term (current) drug therapy: Secondary | ICD-10-CM | POA: Diagnosis not present

## 2015-11-14 LAB — CBC
HCT: 41.2 % (ref 35.0–45.0)
Hemoglobin: 13.5 g/dL (ref 11.7–15.5)
MCH: 31.7 pg (ref 27.0–33.0)
MCHC: 32.8 g/dL (ref 32.0–36.0)
MCV: 96.7 fL (ref 80.0–100.0)
MPV: 9.3 fL (ref 7.5–12.5)
Platelets: 339 10*3/uL (ref 140–400)
RBC: 4.26 MIL/uL (ref 3.80–5.10)
RDW: 13.7 % (ref 11.0–15.0)
WBC: 4.6 10*3/uL (ref 3.8–10.8)

## 2015-11-14 NOTE — Progress Notes (Signed)
HPI:  Here for CPE:  -Concerns and/or follow up today: none  Sees Dr. Benjie Karvonen in gyn for gynecologist appointments and exams.   RA/Sjogren's/Osteoporosis/?fibromyalgia: -sees Dr. Dossie Der, [facility], Rheumatology -had labs recently: has labs with rheumatologist on a regular basis -apparently had negative RA testing in the past -meds: on methotrexate, Vit D3, cymbalta, doing prolia; may start humira -wants [place] to check cbc and CMP today with labs and she will forward to her rheumatologist  Depression and Anxiety: -more stress and anxiety then depression, Sees [facility]ines in Dr. Arvil Persons  -has done CBT in the past -sick husband with advance prostate ca, daughter and son with issues -nurse, working 3 days per week -meds: cymbalta  -Diabetes and Dyslipidemia Screening: npo for six hours  -Hx of HTN: no  -Vaccines: UTD - except shingles, will consider, wants to check on cost  -pap history: sees gyn for this  -FDLMP: n/a  -sexual activity: yes, female partner, no new partners  -wants STI testing (Hep C if born [birth year]): no  -FH breast, colon or ovarian ca: see FH Last mammogram: does yearly and agrees to schedule Last colon cancer screening: never done, refuses colonoscopy, agrees to cologuard  Breast Ca Risk Assessment: -sees gyn for breast exams   -Alcohol, Tobacco, drug use: see social history  Review of Systems - no fevers, unintentional weight loss, vision loss, hearing loss, chest pain, sob, hemoptysis, melena, hematochezia, hematuria, genital discharge, changing or concerning skin lesions, bleeding, bruising, loc, thoughts of self harm or SI  Past Medical History:  Diagnosis Date  . Anemia    pt does not recall having this, CBC 05/2015 normal  . Anxiety and depression 07/03/2015   -sees psychiatry, Metta Clines   . Chronic low back pain 05/13/2014  . DCIS (ductal carcinoma in situ)    1992 dx during pregnancy, s/p lumpectomy remotely and no  further treatment; benign biopsy in 2002  . Left ankle sprain    w/ lat mal avulsion fx in 2017; saw Dr. Doran Durand  . Osteoporosis 07/03/2015   -seeing Dr. Dossie Der -did not tolerate boniva   . Rheumatoid arthritis(714.0)    Dx in 2017, seeing Dr. Dossie Der, Rheumatologist  . Sjogren's syndrome [facility])    Sees Rheumatologist    Past Surgical History:  Procedure Laterality Date  . ABCESS DRAINAGE  2012   sinus  . BREAST SURGERY  1992, 2002   biopsy  . LAPAROSCOPY  1990  . MOUTH RANULA EXCISION  1996  . ORIF FOOT FRACTURE  2007   left 5th metatarsal    Family History  Problem Relation Age of Onset  . Hyperlipidemia Mother   . Diabetes Mother   . Arthritis Father   . Hyperlipidemia Father   . Cancer Maternal Aunt     breast  . Arthritis Maternal Grandmother   . Arthritis Maternal Grandfather   . Arthritis Paternal Grandmother   . Diabetes Paternal Grandmother   . Arthritis Paternal Grandfather   . Dementia Mother   . Parkinson's disease Mother     Social History   Social History  . Marital status: Married    Spouse name: N/A  . Number of children: 2  . Years of education: N/A   Occupational History  . RN [employer]   Social History Main Topics  . Smoking status: Current Some Day Smoker    Types: Cigarettes  . Smokeless tobacco: Never Used     Comment: 1 cigarette per day  . Alcohol use  Yes  . Drug use: No  . Sexual activity: Not on file   Other Topics Concern  . Not on file   Social History Narrative   Work or School: Therapist, sports, 3 days per week      Home Situation: lives with husband - he is sick with prostate ca; adopted son and daughter is Oncologist      Spiritual Beliefs: Catholic - very active in the faith      Lifestyle: walking on a regular basis; diet is healthy      Drinks very little red wine, uses caution.           Current Outpatient Prescriptions:  .  Cholecalciferol (VITAMIN D PO), Take 2,000 Units by mouth daily. , Disp: , Rfl:  .  denosumab  (PROLIA) 60 MG/ML SOLN injection, Inject 60 mg into the skin every 6 (six) months. Administer in upper arm, thigh, or abdomen, Disp: , Rfl:  .  diclofenac sodium (VOLTAREN) 1 % GEL, Apply 1 application topically 4 (four) times daily., Disp: 100 g, Rfl: 0 .  DULoxetine (CYMBALTA) 30 MG capsule, TAKE 3 CAPSULES BY MOUTH IN THE MORNING, Disp: , Rfl: 1 .  fish oil-omega-3 fatty acids 1000 MG capsule, Take 2 g by mouth daily.  , Disp: , Rfl:  .  folic acid (FOLVITE) 1 MG tablet, Take 2 tablets by mouth daily. , Disp: , Rfl:  .  methotrexate (RHEUMATREX) 15 MG tablet, Take 15 mg by mouth once a week. Caution: Chemotherapy. Protect from light., Disp: , Rfl:  .  Multiple Vitamin (MULTIVITAMIN) tablet, Take 1 tablet by mouth daily.  , Disp: , Rfl:  .  naproxen sodium (ANAPROX) 220 MG tablet, Take 220 mg by mouth as needed. , Disp: , Rfl:  .  NON FORMULARY, Tumeric 584m twice a day, Disp: , Rfl:  .  Probiotic Product (PROBIOTIC DAILY PO), Take 1 capsule by mouth daily., Disp: , Rfl:  .  vitamin C (ASCORBIC ACID) 500 MG tablet, Take 500 mg by mouth daily., Disp: , Rfl:   EXAM:  Vitals:   11/14/15 1458  Pulse: 74  Temp: 97.9 F (36.6 C)    GENERAL: vitals reviewed and listed below, alert, oriented, appears well hydrated and in no acute distress  HEENT: head atraumatic, PERRLA, normal appearance of eyes, ears, nose and mouth. moist mucus membranes.  NECK: supple, no masses or lymphadenopathy  LUNGS: clear to auscultation bilaterally, no rales, rhonchi or wheeze  CV: HRRR, no peripheral edema or cyanosis, normal pedal pulses  BREAST: declined  ABDOMEN: bowel sounds normal, soft, non tender to palpation, no masses, no rebound or guarding  GU: declined  SKIN: no rash or abnormal lesions  MS: normal gait, moves all extremities normally  NEURO: normal gait, speech and thought processing grossly intact, muscle tone grossly intact throughout  PSYCH: normal affect, pleasant and  cooperative  ASSESSMENT AND PLAN:  Discussed the following assessment and plan:  Encounter for preventive health examination - Plan: Hep C Antibody, Cholesterol, Total, CBC (no diff), Hemoglobin A1c, HDL cholesterol, CANCELED: Cholesterol, Total, CANCELED: HDL cholesterol, CANCELED: Hemoglobin A1c  Rheumatoid arthritis of multiple sites with negative rheumatoid factor ([place] - Plan: CMP with eGFR, Cholesterol, Total, CBC (no diff), Hemoglobin A1c, HDL cholesterol, CANCELED: CBC (no diff)  Anxiety and depression - Plan: Cholesterol, Total, CBC (no diff), Hemoglobin A1c, HDL cholesterol  Osteoporosis, unspecified osteoporosis type, unspecified pathological fracture presence - Plan: Cholesterol, Total, CBC (no diff), Hemoglobin A1c, HDL cholesterol   -  Discussed and advised all [place] preventive services health task force level A and B recommendations for age, sex and risks.  -Advised at least 150 minutes of exercise per week and a healthy diet with avoidance of (less then 1 serving per week) processed foods, white starches, red meat, fast foods and sweets and consisting of: * 5-9 servings of fresh fruits and vegetables (not corn or potatoes) *nuts and seeds, beans *olives and olive oil *lean meats such as fish and white chicken  *whole grains  -labs, studies and vaccines per orders this encounter  Orders Placed This Encounter  Procedures  . CMP with eGFR  . Hep C Antibody  . Cholesterol, Total  . CBC (no diff)  . Hemoglobin A1c  . HDL cholesterol    Patient advised to return to clinic immediately if symptoms worsen or persist or new concerns.  Patient Instructions  BEFORE YOU LEAVE: -follow up: yearly and as needed -please order the cologuard -labs -please obtain last pap results from gyn and update  We have ordered labs or studies at this visit. It can take up to 1-2 weeks for results and processing. IF results require follow up or explanation, we will call you with  instructions. Clinically stable results will be released to your MYCHART. If you have not heard from us or cannot find your results in MYCHART in 2 weeks please contact our office at [phone].  If you are not yet signed up for MYCHART, please consider signing up.   We recommend the following healthy lifestyle for LIFE: 1) Small portions.   2) Eat a healthy clean diet.  * Tip: Avoid (less then 1 serving per week): processed foods, sweets, sweetened drinks, white starches (rice, flour, bread, potatoes, pasta, etc), red meat, fast foods, butter  *Tip: CHOOSE instead   * 5-9 servings per day of fresh or frozen fruits and vegetables (but not corn, potatoes, bananas, canned or dried fruit)   *nuts and seeds, beans   *olives and olive oil   *small portions of lean meats such as fish and white chicken    *small portions of whole grains  3)Get at least 150 minutes of sweaty aerobic exercise per week.  4)Reduce stress - consider counseling, meditation and relaxation to balance other aspects of your life.          No Follow-up on file.  KIM, HANNAH R., DO   

## 2015-11-14 NOTE — Progress Notes (Signed)
Pre visit review using our clinic review tool, if applicable. No additional management support is needed unless otherwise documented below in the visit note. 

## 2015-11-14 NOTE — Patient Instructions (Addendum)
BEFORE YOU LEAVE: -follow up: yearly and as needed -please order the cologuard -labs -please obtain last pap results from gyn and update  We have ordered labs or studies at this visit. It can take up to 1-2 weeks for results and processing. IF results require follow up or explanation, we will call you with instructions. Clinically stable results will be released to your [facility]. If you have not heard from [place] or cannot find your results in [facility] in 2 weeks please contact our office at [phone].  If you are not yet signed up for [facility], please consider signing up.   We recommend the following healthy lifestyle for LIFE: 1) Small portions.   2) Eat a healthy clean diet.  * Tip: Avoid (less then 1 serving per week): processed foods, sweets, sweetened drinks, white starches (rice, flour, bread, potatoes, pasta, etc), red meat, fast foods, butter  *Tip: CHOOSE instead   * 5-9 servings per day of fresh or frozen fruits and vegetables (but not corn, potatoes, bananas, canned or dried fruit)   *nuts and seeds, beans   *olives and olive oil   *small portions of lean meats such as fish and white chicken    *small portions of whole grains  3)Get at least 150 minutes of sweaty aerobic exercise per week.  4)Reduce stress - consider counseling, meditation and relaxation to balance other aspects of your life.

## 2015-11-15 LAB — COMPLETE METABOLIC PANEL WITH GFR
ALT: 36 U/L — ABNORMAL HIGH (ref 6–29)
AST: 32 U/L (ref 10–35)
Albumin: 4.4 g/dL (ref 3.6–5.1)
Alkaline Phosphatase: 61 U/L (ref 33–130)
BUN: 10 mg/dL (ref 7–25)
CO2: 28 mmol/L (ref 20–31)
Calcium: 9.8 mg/dL (ref 8.6–10.4)
Chloride: 99 mmol/L (ref 98–110)
Creat: 0.68 mg/dL (ref 0.50–0.99)
GFR, Est African American: >89 mL/min (ref >=60)
GFR, Est Non African American: >89 mL/min (ref >=60)
Glucose, Bld: 88 mg/dL (ref 65–99)
Potassium: 4.1 mmol/L (ref 3.5–5.3)
Sodium: 138 mmol/L (ref 135–146)
Total Bilirubin: 0.6 mg/dL (ref 0.2–1.2)
Total Protein: 7.7 g/dL (ref 6.1–8.1)

## 2015-11-15 LAB — HEMOGLOBIN A1C
Hgb A1c MFr Bld: 5.5 % (ref <5.7)
Mean Plasma Glucose: 111 mg/dL

## 2015-11-15 LAB — HDL CHOLESTEROL: HDL: 101 mg/dL (ref >=46)

## 2015-11-15 LAB — HEPATITIS C ANTIBODY: HCV Ab: NEGATIVE

## 2015-11-15 LAB — CHOLESTEROL, TOTAL: Cholesterol: 238 mg/dL — ABNORMAL HIGH (ref 125–200)

## 2015-11-26 ENCOUNTER — Telehealth: Payer: Self-pay | Admitting: Family Medicine

## 2015-11-26 NOTE — Telephone Encounter (Signed)
° °  [name] call from [employer] and said they need a call back with the ICD 10 code. It was left blank on the order form  [phone]

## 2015-11-27 NOTE — Telephone Encounter (Signed)
I called [employer] and informed Tim the diagnoses should be Z12.11 and Z12.12 and he stated he will update this for the pt.

## 2015-12-03 ENCOUNTER — Other Ambulatory Visit: Payer: Self-pay | Admitting: Family Medicine

## 2015-12-03 DIAGNOSIS — Z1231 Encounter for screening mammogram for malignant neoplasm of breast: Secondary | ICD-10-CM

## 2015-12-05 DIAGNOSIS — F4323 Adjustment disorder with mixed anxiety and depressed mood: Secondary | ICD-10-CM | POA: Diagnosis not present

## 2015-12-08 DIAGNOSIS — F331 Major depressive disorder, recurrent, moderate: Secondary | ICD-10-CM | POA: Diagnosis not present

## 2015-12-08 DIAGNOSIS — F4322 Adjustment disorder with anxiety: Secondary | ICD-10-CM | POA: Diagnosis not present

## 2015-12-11 ENCOUNTER — Encounter: Payer: Self-pay | Admitting: Family Medicine

## 2015-12-16 LAB — COLOGUARD: Cologuard: NEGATIVE

## 2015-12-29 ENCOUNTER — Encounter: Payer: Self-pay | Admitting: Family Medicine

## 2016-01-05 ENCOUNTER — Ambulatory Visit: Payer: Federal, State, Local not specified - PPO

## 2016-01-06 DIAGNOSIS — E559 Vitamin D deficiency, unspecified: Secondary | ICD-10-CM | POA: Diagnosis not present

## 2016-01-06 DIAGNOSIS — Z79899 Other long term (current) drug therapy: Secondary | ICD-10-CM | POA: Diagnosis not present

## 2016-01-06 DIAGNOSIS — M810 Age-related osteoporosis without current pathological fracture: Secondary | ICD-10-CM | POA: Diagnosis not present

## 2016-01-06 DIAGNOSIS — F4323 Adjustment disorder with mixed anxiety and depressed mood: Secondary | ICD-10-CM | POA: Diagnosis not present

## 2016-01-06 DIAGNOSIS — M3500 Sicca syndrome, unspecified: Secondary | ICD-10-CM | POA: Diagnosis not present

## 2016-01-07 ENCOUNTER — Ambulatory Visit
Admission: RE | Admit: 2016-01-07 | Discharge: 2016-01-07 | Disposition: A | Discharge status: Home / Self Care | Payer: Federal, State, Local not specified - PPO | Source: Ambulatory Visit | Attending: Family Medicine | Admitting: Family Medicine

## 2016-01-07 DIAGNOSIS — Z1231 Encounter for screening mammogram for malignant neoplasm of breast: Secondary | ICD-10-CM

## 2016-01-09 ENCOUNTER — Ambulatory Visit: Payer: Federal, State, Local not specified - PPO

## 2016-02-17 DIAGNOSIS — M542 Cervicalgia: Secondary | ICD-10-CM | POA: Diagnosis not present

## 2016-02-17 DIAGNOSIS — M5410 Radiculopathy, site unspecified: Secondary | ICD-10-CM | POA: Diagnosis not present

## 2016-02-25 DIAGNOSIS — M5410 Radiculopathy, site unspecified: Secondary | ICD-10-CM | POA: Diagnosis not present

## 2016-02-25 DIAGNOSIS — M542 Cervicalgia: Secondary | ICD-10-CM | POA: Diagnosis not present

## 2016-03-03 DIAGNOSIS — M5410 Radiculopathy, site unspecified: Secondary | ICD-10-CM | POA: Diagnosis not present

## 2016-03-03 DIAGNOSIS — M542 Cervicalgia: Secondary | ICD-10-CM | POA: Diagnosis not present

## 2016-03-09 DIAGNOSIS — M5410 Radiculopathy, site unspecified: Secondary | ICD-10-CM | POA: Diagnosis not present

## 2016-03-09 DIAGNOSIS — M542 Cervicalgia: Secondary | ICD-10-CM | POA: Diagnosis not present

## 2016-03-11 DIAGNOSIS — M0579 Rheumatoid arthritis with rheumatoid factor of multiple sites without organ or systems involvement: Secondary | ICD-10-CM | POA: Diagnosis not present

## 2016-03-11 DIAGNOSIS — M3500 Sicca syndrome, unspecified: Secondary | ICD-10-CM | POA: Diagnosis not present

## 2016-03-11 DIAGNOSIS — M79641 Pain in right hand: Secondary | ICD-10-CM | POA: Diagnosis not present

## 2016-03-11 DIAGNOSIS — Z79899 Other long term (current) drug therapy: Secondary | ICD-10-CM | POA: Diagnosis not present

## 2016-03-15 DIAGNOSIS — M5410 Radiculopathy, site unspecified: Secondary | ICD-10-CM | POA: Diagnosis not present

## 2016-03-15 DIAGNOSIS — M542 Cervicalgia: Secondary | ICD-10-CM | POA: Diagnosis not present

## 2016-03-29 DIAGNOSIS — M5410 Radiculopathy, site unspecified: Secondary | ICD-10-CM | POA: Diagnosis not present

## 2016-03-29 DIAGNOSIS — M542 Cervicalgia: Secondary | ICD-10-CM | POA: Diagnosis not present

## 2016-04-07 DIAGNOSIS — M5410 Radiculopathy, site unspecified: Secondary | ICD-10-CM | POA: Diagnosis not present

## 2016-04-07 DIAGNOSIS — M542 Cervicalgia: Secondary | ICD-10-CM | POA: Diagnosis not present

## 2016-04-18 DIAGNOSIS — J101 Influenza due to other identified influenza virus with other respiratory manifestations: Secondary | ICD-10-CM | POA: Diagnosis not present

## 2016-04-21 DIAGNOSIS — R05 Cough: Secondary | ICD-10-CM | POA: Diagnosis not present

## 2016-04-27 ENCOUNTER — Ambulatory Visit (INDEPENDENT_AMBULATORY_CARE_PROVIDER_SITE_OTHER): Payer: Federal, State, Local not specified - PPO | Admitting: Internal Medicine

## 2016-04-27 ENCOUNTER — Other Ambulatory Visit (INDEPENDENT_AMBULATORY_CARE_PROVIDER_SITE_OTHER): Payer: Federal, State, Local not specified - PPO

## 2016-04-27 ENCOUNTER — Encounter: Payer: Self-pay | Admitting: Internal Medicine

## 2016-04-27 ENCOUNTER — Ambulatory Visit (INDEPENDENT_AMBULATORY_CARE_PROVIDER_SITE_OTHER)
Admission: RE | Admit: 2016-04-27 | Discharge: 2016-04-27 | Disposition: A | Discharge status: Home / Self Care | Payer: Federal, State, Local not specified - PPO | Source: Ambulatory Visit | Attending: Internal Medicine | Admitting: Internal Medicine

## 2016-04-27 VITALS — BP 110/80 | HR 77 | Temp 98.5°F | Ht 61.75 in | Wt 116.6 lb

## 2016-04-27 DIAGNOSIS — R053 Chronic cough: Secondary | ICD-10-CM

## 2016-04-27 DIAGNOSIS — J111 Influenza due to unidentified influenza virus with other respiratory manifestations: Secondary | ICD-10-CM

## 2016-04-27 DIAGNOSIS — Z79899 Other long term (current) drug therapy: Secondary | ICD-10-CM | POA: Diagnosis not present

## 2016-04-27 DIAGNOSIS — R69 Illness, unspecified: Secondary | ICD-10-CM | POA: Diagnosis not present

## 2016-04-27 DIAGNOSIS — R05 Cough: Secondary | ICD-10-CM | POA: Diagnosis not present

## 2016-04-27 DIAGNOSIS — M3500 Sicca syndrome, unspecified: Secondary | ICD-10-CM

## 2016-04-27 DIAGNOSIS — Z20828 Contact with and (suspected) exposure to other viral communicable diseases: Secondary | ICD-10-CM | POA: Diagnosis not present

## 2016-04-27 LAB — COMPREHENSIVE METABOLIC PANEL
ALT: 28 U/L (ref 0–35)
AST: 31 U/L (ref 0–37)
Albumin: 4.2 g/dL (ref 3.5–5.2)
Alkaline Phosphatase: 43 U/L (ref 39–117)
BUN: 16 mg/dL (ref 6–23)
CO2: 31 mEq/L (ref 19–32)
Calcium: 9.1 mg/dL (ref 8.4–10.5)
Chloride: 104 mEq/L (ref 96–112)
Creatinine, Ser: 0.63 mg/dL (ref 0.40–1.20)
GFR: 101.63 mL/min (ref >60.00)
Glucose, Bld: 95 mg/dL (ref 70–99)
Potassium: 4 mEq/L (ref 3.5–5.1)
Sodium: 139 mEq/L (ref 135–145)
Total Bilirubin: 0.4 mg/dL (ref 0.2–1.2)
Total Protein: 7.4 g/dL (ref 6.0–8.3)

## 2016-04-27 LAB — CBC WITH DIFFERENTIAL/PLATELET
Basophils Absolute: 0 10*3/uL (ref 0.0–0.1)
Basophils Relative: 0.3 % (ref 0.0–3.0)
Eosinophils Absolute: 0.1 10*3/uL (ref 0.0–0.7)
Eosinophils Relative: 1.1 % (ref 0.0–5.0)
HCT: 39.4 % (ref 36.0–46.0)
Hemoglobin: 13.3 g/dL (ref 12.0–15.0)
Lymphocytes Relative: 39.1 % (ref 12.0–46.0)
Lymphs Abs: 2 10*3/uL (ref 0.7–4.0)
MCHC: 33.7 g/dL (ref 30.0–36.0)
MCV: 95.4 fl (ref 78.0–100.0)
Monocytes Absolute: 0.7 10*3/uL (ref 0.1–1.0)
Monocytes Relative: 13 % — ABNORMAL HIGH (ref 3.0–12.0)
Neutro Abs: 2.4 10*3/uL (ref 1.4–7.7)
Neutrophils Relative %: 46.5 % (ref 43.0–77.0)
Platelets: 212 10*3/uL (ref 150.0–400.0)
RBC: 4.13 Mil/uL (ref 3.87–5.11)
RDW: 13.3 % (ref 11.5–15.5)
WBC: 5.1 10*3/uL (ref 4.0–10.5)

## 2016-04-27 NOTE — Patient Instructions (Signed)
Plan x ray and blood work today   Stay on the tamifu  May have to add back an antibiotic if indicated  The fatigue can go with flu  Infection.   Exam is reassuring but  Some wheezy sound at right base  May not be significant .

## 2016-04-27 NOTE — Progress Notes (Signed)
Chief Complaint  Patient presents with  . Cough    started 12 dyas ago/ took doxy and finished/ also took tamiflu   . Headache  . Generalized Body Aches    HPI: Andrea Fields 61 y.o.   sda    Wanted asap appt  And not wait for PCP  appt  She has had some respiratory problems ever since about  March 15   At work    Flu like   Sx  And husband sick  Urgent care  [facility]. Didn't test  But husband given doxy     And Saturday  3 days ago  husband  Micah Flesher to hospital  And  Went to [employer] and had b  And lactic acid 2.4  Rocephin .   ama Husband   Given  tamiflu bid  Sunday 2 days ago    Influenza B. Prescription was also called in for her. She's been on it 2 days." I feel worse today than Sunday .   "  Fever 99.9  Last week  .      Back pain and under arm .    Cough   ? diffeeernt breathing.  Last dose doxy only 3 day s. Stopped 3   36 hours ago   Is on biologics  No recnet labs   Has copugh and sinus congestion but no hemoptysis and sig fatigue  ROS: See pertinent positives and negatives per HPI.  Past Medical History:  Diagnosis Date  . Anemia    pt does not recall having this, CBC 05/2015 normal  . Anxiety and depression 07/03/2015   -sees psychiatry, [facility]   . Chronic low back pain 05/13/2014  . DCIS (ductal carcinoma in situ)    1992 dx during pregnancy, s/p lumpectomy remotely and no further treatment; benign biopsy in 2002  . Left ankle sprain    w/ lat mal avulsion fx in 2017; saw Dr. Victorino Dike  . Osteoporosis 07/03/2015   -seeing Dr. Kathi Ludwig -did not tolerate boniva   . Rheumatoid arthritis(714.0)    Dx in 2017, seeing Dr. Kathi Ludwig, Rheumatologist  . Sjogren's syndrome [facility])    Sees Rheumatologist    Family History  Problem Relation Age of Onset  . Hyperlipidemia Mother   . Diabetes Mother   . Arthritis Father   . Hyperlipidemia Father   . Cancer Maternal Aunt     breast  . Arthritis Maternal Grandmother   . Arthritis Maternal Grandfather   . Arthritis Paternal  Grandmother   . Diabetes Paternal Grandmother   . Arthritis Paternal Grandfather   . Dementia Mother   . Parkinson's disease Mother     Social History   Social History  . Marital status: Married    Spouse name: N/A  . Number of children: 2  . Years of education: N/A   Occupational History  . RN [employer]   Social History Main Topics  . Smoking status: Current Some Day Smoker    Types: Cigarettes  . Smokeless tobacco: Never Used     Comment: 1 cigarette per day  . Alcohol use Yes  . Drug use: No  . Sexual activity: Not Asked   Other Topics Concern  . None   Social History Narrative   Work or School: Charity fundraiser, 3 days per week      Home Situation: lives with husband - he is sick with prostate ca; adopted son and daughter is Airline pilot  Beliefs: Catholic - very active in the faith      Lifestyle: walking on a regular basis; diet is healthy      Drinks very little red wine, uses caution.          Outpatient Medications Prior to Visit  Medication Sig Dispense Refill  . Cholecalciferol (VITAMIN D PO) Take 2,000 Units by mouth daily.     Marland Kitchen denosumab (PROLIA) 60 MG/ML SOLN injection Inject 60 mg into the skin every 6 (six) months. Administer in upper arm, thigh, or abdomen    . diclofenac sodium (VOLTAREN) 1 % GEL Apply 1 application topically 4 (four) times daily. 100 g 0  . DULoxetine (CYMBALTA) 30 MG capsule TAKE 3 CAPSULES BY MOUTH IN THE MORNING  1  . fish oil-omega-3 fatty acids 1000 MG capsule Take 2 g by mouth daily.      . folic acid (FOLVITE) 1 MG tablet Take 2 tablets by mouth daily.     . methotrexate (RHEUMATREX) 15 MG tablet Take 15 mg by mouth once a week. Caution: Chemotherapy. Protect from light.    . Multiple Vitamin (MULTIVITAMIN) tablet Take 1 tablet by mouth daily.      . naproxen sodium (ANAPROX) 220 MG tablet Take 220 mg by mouth as needed.     . NON FORMULARY Tumeric 500mg  twice a day    . Probiotic Product (PROBIOTIC DAILY PO) Take 1  capsule by mouth daily.    . vitamin C (ASCORBIC ACID) 500 MG tablet Take 500 mg by mouth daily.     No facility-administered medications prior to visit.      EXAM:  BP 110/80 (BP Location: Left Arm, Patient Position: Sitting, Cuff Size: Normal)   Pulse 77   Temp 98.5 F (36.9 C) (Oral)   Ht 5' 1.75" (1.568 m)   Wt 116 lb 9.6 oz (52.9 kg)   SpO2 98%   BMI 21.50 kg/m   Body mass index is 21.5 kg/m. WDWN in NAD  quiet respirations; mildly congested  somewhat hoarse. Non toxic nl speech .  . ocass bronchial cough  HEENT: Normocephalic ;atraumatic , Eyes;  PERRL, EOMs  Full, lids and conjunctiva clear,,Ears: no deformities, canals nl, TM landmarks normal, Nose: no deformity or discharge but congested;face non tender Mouth : OP clear without lesion or edema . Neck: Supple without adenopathy or masses or bruits Chest:  Clear to A&P without wheezes rales or rhonchi? Crackle aat right base  Clear with cough CV:  S1-S2 no gallops or murmurs peripheral perfusion is normal Skin :nl perfusion and no acute rashes    ASSESSMENT AND PLAN:  Discussed the following assessment and plan:  Influenza-like illness - Plan: CBC with Differential/Platelet, Comprehensive metabolic panel, DG Chest 2 View  Sjogren's syndrome, with unspecified organ involvement (HCC) - Plan: CBC with Differential/Platelet, Comprehensive metabolic panel, DG Chest 2 View  Cough, persistent - Plan: CBC with Differential/Platelet, Comprehensive metabolic panel, DG Chest 2 View  High risk medication use - Plan: CBC with Differential/Platelet, Comprehensive metabolic panel, DG Chest 2 View  Exposure to influenza Basically respiratory infection symptoms almost 2 weeks possibly 2 separate illnesses interim husband has documented influenza B and patient now on beginning of Tamiflu. Care from urgent care being given doxycycline uncertain diagnosis she took only a few days ? Med  Uncertain help  Care with multiple providers and  meds .  However she is definitely had flu exposure and high risk stay on the Tamiflu could get some side  effects from now. I think the fatigue is not atypical for which she is going through. Get chest x-ray and blood work today to look for ongoing pneumonia be bacterial. Close follow-up is appropriate with her PCP or elsewhere. -Patient advised to return or notify health care team  if symptoms worsen ,persist or new concerns arise.  Patient Instructions  Plan x ray and blood work today   Stay on the tamifu  May have to add back an antibiotic if indicated  The fatigue can go with flu  Infection.   Exam is reassuring but  Some wheezy sound at right base  May not be significant .      Neta Mends. Panosh M.D.

## 2016-05-03 DIAGNOSIS — F4323 Adjustment disorder with mixed anxiety and depressed mood: Secondary | ICD-10-CM | POA: Diagnosis not present

## 2016-05-07 ENCOUNTER — Telehealth: Payer: Self-pay | Admitting: Family Medicine

## 2016-05-07 NOTE — Telephone Encounter (Signed)
Received FMLA form from [employer].  Pt seen Dr. Fabian Sharp due to flu like illness on 04/27/2016.  Need further information to fill out the form.  Left a message for a return call.

## 2016-05-10 DIAGNOSIS — H5213 Myopia, bilateral: Secondary | ICD-10-CM | POA: Diagnosis not present

## 2016-05-10 DIAGNOSIS — H04123 Dry eye syndrome of bilateral lacrimal glands: Secondary | ICD-10-CM | POA: Diagnosis not present

## 2016-05-11 NOTE — Telephone Encounter (Signed)
Left a message for a return call.  Need to know what days she was out of work.

## 2016-05-12 NOTE — Telephone Encounter (Signed)
Pt called and state that she was out for the following days 3/19 Monday 3/21 Wednesday 3/27 Tuesday.

## 2016-05-12 NOTE — Telephone Encounter (Signed)
Left a message informing the pt that her paper work was faxed.  Asked her to call back if needed.

## 2016-05-17 ENCOUNTER — Ambulatory Visit (INDEPENDENT_AMBULATORY_CARE_PROVIDER_SITE_OTHER): Payer: Federal, State, Local not specified - PPO | Admitting: Family Medicine

## 2016-05-17 ENCOUNTER — Encounter: Payer: Self-pay | Admitting: Family Medicine

## 2016-05-17 VITALS — BP 102/78 | HR 79 | Temp 98.1°F | Ht 61.75 in | Wt 117.9 lb

## 2016-05-17 DIAGNOSIS — J989 Respiratory disorder, unspecified: Secondary | ICD-10-CM | POA: Diagnosis not present

## 2016-05-17 DIAGNOSIS — F4323 Adjustment disorder with mixed anxiety and depressed mood: Secondary | ICD-10-CM | POA: Diagnosis not present

## 2016-05-17 DIAGNOSIS — Z72820 Sleep deprivation: Secondary | ICD-10-CM | POA: Insufficient documentation

## 2016-05-17 NOTE — Progress Notes (Signed)
HPI:  Follow up respiratory illness: -has several weeks of respiratory illness in March -saw Dr. Fabian Sharp for this and [facility] - husband with flu, she was treated with tamflu and doxy, neg CXR and labs -reports: this has resolved - took a couple weeks to regain energy -denies:sinus issues, ear issues, SOB, fevers, malaise -plans to see rheumatologist to get back on her med for RA  Poor sleep: -does shift work -wants to stay away from sleep aides -considering melatonin  ROS: See pertinent positives and negatives per HPI.  Past Medical History:  Diagnosis Date  . Anemia    pt does not recall having this, CBC 05/2015 normal  . Anxiety and depression 07/03/2015   -sees psychiatry, [facility]   . Chronic low back pain 05/13/2014  . DCIS (ductal carcinoma in situ)    1992 dx during pregnancy, s/p lumpectomy remotely and no further treatment; benign biopsy in 2002  . Left ankle sprain    w/ lat mal avulsion fx in 2017; saw Dr. Victorino Dike  . Osteoporosis 07/03/2015   -seeing Dr. Kathi Ludwig -did not tolerate boniva   . Rheumatoid arthritis(714.0)    Dx in 2017, seeing Dr. Kathi Ludwig, Rheumatologist  . Sjogren's syndrome [facility])    Sees Rheumatologist    Past Surgical History:  Procedure Laterality Date  . ABCESS DRAINAGE  2012   sinus  . BREAST SURGERY  1992, 2002   biopsy  . LAPAROSCOPY  1990  . MOUTH RANULA EXCISION  1996  . ORIF FOOT FRACTURE  2007   left 5th metatarsal    Family History  Problem Relation Age of Onset  . Hyperlipidemia Mother   . Diabetes Mother   . Arthritis Father   . Hyperlipidemia Father   . Cancer Maternal Aunt     breast  . Arthritis Maternal Grandmother   . Arthritis Maternal Grandfather   . Arthritis Paternal Grandmother   . Diabetes Paternal Grandmother   . Arthritis Paternal Grandfather   . Dementia Mother   . Parkinson's disease Mother     Social History   Social History  . Marital status: Married    Spouse name: N/A  . Number of children: 2  . Years  of education: N/A   Occupational History  . RN [employer]   Social History Main Topics  . Smoking status: Current Some Day Smoker    Types: Cigarettes  . Smokeless tobacco: Never Used     Comment: 1 cigarette per day  . Alcohol use Yes  . Drug use: No  . Sexual activity: Not Asked   Other Topics Concern  . None   Social History Narrative   Work or School: Charity fundraiser, 3 days per week      Home Situation: lives with husband - he is sick with prostate ca; adopted son and daughter is Human resources officer      Spiritual Beliefs: Catholic - very active in the faith      Lifestyle: walking on a regular basis; diet is healthy      Drinks very little red wine, uses caution.           Current Outpatient Prescriptions:  .  Adalimumab (HUMIRA PEN [place]), Inject 40 mg into the skin., Disp: , Rfl:  .  Cholecalciferol (VITAMIN D PO), Take 2,000 Units by mouth daily. , Disp: , Rfl:  .  denosumab (PROLIA) 60 MG/ML SOLN injection, Inject 60 mg into the skin every 6 (six) months. Administer in upper arm, thigh, or abdomen, Disp: ,  Rfl:  .  diclofenac sodium (VOLTAREN) 1 % GEL, Apply 1 application topically 4 (four) times daily., Disp: 100 g, Rfl: 0 .  DULoxetine (CYMBALTA) 30 MG capsule, TAKE 3 CAPSULES BY MOUTH IN THE MORNING, Disp: , Rfl: 1 .  fish oil-omega-3 fatty acids 1000 MG capsule, Take 2 g by mouth daily.  , Disp: , Rfl:  .  folic acid (FOLVITE) 1 MG tablet, Take 2 tablets by mouth daily. , Disp: , Rfl:  .  methotrexate (RHEUMATREX) 15 MG tablet, Take 15 mg by mouth once a week. Caution: Chemotherapy. Protect from light., Disp: , Rfl:  .  Multiple Vitamin (MULTIVITAMIN) tablet, Take 1 tablet by mouth daily.  , Disp: , Rfl:  .  naproxen sodium (ANAPROX) 220 MG tablet, Take 220 mg by mouth as needed. , Disp: , Rfl:  .  NON FORMULARY, Tumeric 500mg  twice a day, Disp: , Rfl:  .  Probiotic Product (PROBIOTIC DAILY PO), Take 1 capsule by mouth daily., Disp: , Rfl:  .  vitamin C (ASCORBIC ACID) 500 MG  tablet, Take 500 mg by mouth daily., Disp: , Rfl:   EXAM:  Vitals:   05/17/16 1422  BP: 102/78  Pulse: 79  Temp: 98.1 F (36.7 C)    Body mass index is 21.74 kg/m.  GENERAL: vitals reviewed and listed above, alert, oriented, appears well hydrated and in no acute distress  HEENT: atraumatic, conjunttiva clear, no obvious abnormalities on inspection of external nose and ears  NECK: no obvious masses on inspection  LUNGS: clear to auscultation bilaterally, no wheezes, rales or rhonchi, good air movement  CV: HRRR, no peripheral edema  MS: moves all extremities without noticeable abnormality  PSYCH: pleasant and cooperative, no obvious depression or anxiety  ASSESSMENT AND PLAN:  Discussed the following assessment and plan:  Respiratory illness -resolved -plans to see rheumatologist for restarting meds for RA  Poor sleep -discussed tx options -advised trial sleep hygeine and low dose occasional use melatonin -zyrtec made her rsl worse  No AVS. -Patient advised to return or notify a doctor immediately if symptoms worsen or persist or new concerns arise.  There are no Patient Instructions on file for this visit.  05/19/16 R., DO

## 2016-05-17 NOTE — Progress Notes (Signed)
Pre visit review using our clinic review tool, if applicable. No additional management support is needed unless otherwise documented below in the visit note. 

## 2016-06-07 DIAGNOSIS — F331 Major depressive disorder, recurrent, moderate: Secondary | ICD-10-CM | POA: Diagnosis not present

## 2016-06-07 DIAGNOSIS — F4322 Adjustment disorder with anxiety: Secondary | ICD-10-CM | POA: Diagnosis not present

## 2016-06-09 DIAGNOSIS — K080 Exfoliation of teeth due to systemic causes: Secondary | ICD-10-CM | POA: Diagnosis not present

## 2016-06-14 DIAGNOSIS — F4323 Adjustment disorder with mixed anxiety and depressed mood: Secondary | ICD-10-CM | POA: Diagnosis not present

## 2016-06-14 DIAGNOSIS — M3500 Sicca syndrome, unspecified: Secondary | ICD-10-CM | POA: Diagnosis not present

## 2016-06-14 DIAGNOSIS — M0579 Rheumatoid arthritis with rheumatoid factor of multiple sites without organ or systems involvement: Secondary | ICD-10-CM | POA: Diagnosis not present

## 2016-06-14 DIAGNOSIS — Z79899 Other long term (current) drug therapy: Secondary | ICD-10-CM | POA: Diagnosis not present

## 2016-06-14 DIAGNOSIS — M810 Age-related osteoporosis without current pathological fracture: Secondary | ICD-10-CM | POA: Diagnosis not present

## 2016-06-14 DIAGNOSIS — M79641 Pain in right hand: Secondary | ICD-10-CM | POA: Diagnosis not present

## 2016-07-12 DIAGNOSIS — F4323 Adjustment disorder with mixed anxiety and depressed mood: Secondary | ICD-10-CM | POA: Diagnosis not present

## 2016-07-19 DIAGNOSIS — M0579 Rheumatoid arthritis with rheumatoid factor of multiple sites without organ or systems involvement: Secondary | ICD-10-CM | POA: Diagnosis not present

## 2016-07-19 DIAGNOSIS — M3500 Sicca syndrome, unspecified: Secondary | ICD-10-CM | POA: Diagnosis not present

## 2016-07-19 DIAGNOSIS — M79641 Pain in right hand: Secondary | ICD-10-CM | POA: Diagnosis not present

## 2016-07-19 DIAGNOSIS — Z79899 Other long term (current) drug therapy: Secondary | ICD-10-CM | POA: Diagnosis not present

## 2016-08-05 DIAGNOSIS — R05 Cough: Secondary | ICD-10-CM | POA: Diagnosis not present

## 2016-08-05 DIAGNOSIS — H10022 Other mucopurulent conjunctivitis, left eye: Secondary | ICD-10-CM | POA: Diagnosis not present

## 2016-08-05 DIAGNOSIS — J0100 Acute maxillary sinusitis, unspecified: Secondary | ICD-10-CM | POA: Diagnosis not present

## 2016-08-09 DIAGNOSIS — F4323 Adjustment disorder with mixed anxiety and depressed mood: Secondary | ICD-10-CM | POA: Diagnosis not present

## 2016-08-20 ENCOUNTER — Ambulatory Visit: Payer: Federal, State, Local not specified - PPO | Admitting: Family Medicine

## 2016-08-31 ENCOUNTER — Ambulatory Visit (HOSPITAL_COMMUNITY)
Admission: EM | Admit: 2016-08-31 | Discharge: 2016-08-31 | Disposition: A | Discharge status: Home / Self Care | Payer: Federal, State, Local not specified - PPO | Attending: Emergency Medicine | Admitting: Emergency Medicine

## 2016-08-31 ENCOUNTER — Encounter (HOSPITAL_COMMUNITY): Payer: Self-pay | Admitting: Emergency Medicine

## 2016-08-31 DIAGNOSIS — R05 Cough: Secondary | ICD-10-CM

## 2016-08-31 DIAGNOSIS — R059 Cough, unspecified: Secondary | ICD-10-CM

## 2016-08-31 DIAGNOSIS — J40 Bronchitis, not specified as acute or chronic: Secondary | ICD-10-CM | POA: Diagnosis not present

## 2016-08-31 MED ORDER — METHYLPREDNISOLONE 4 MG PO TBPK: ORAL_TABLET | ORAL | 0 refills | Status: DC

## 2016-08-31 MED ORDER — IPRATROPIUM BROMIDE 0.06 % NA SOLN: 2.0000 | Freq: Four times a day (QID) | NASAL | 0 refills | Status: DC

## 2016-08-31 MED ORDER — BENZONATATE 100 MG PO CAPS: 200.0000 mg | ORAL_CAPSULE | Freq: Three times a day (TID) | ORAL | 0 refills | Status: DC | PRN

## 2016-08-31 NOTE — ED Triage Notes (Signed)
Pt reports upper respiratory infection symptoms x 1 month, worsening over past couple days - pt states the cough has been getting worse, clear production. Pt has been taking OTC meds without relief. Denies fevers/chills.

## 2016-08-31 NOTE — ED Provider Notes (Signed)
CSN: [id number]     Arrival date & time 08/31/16  1633 History   None    Chief Complaint  Patient presents with  . URI   (Consider location/radiation/quality/duration/timing/severity/associated sxs/prior Treatment) Patient c/o cough and uri sx's for over 2 weeks. She states she did take a round of amoxicillin and it didn't help   The history is provided by the patient.  URI  Presenting symptoms: congestion, cough, fatigue and rhinorrhea   Severity:  Moderate Onset quality:  Sudden Duration:  2 weeks Timing:  Constant Progression:  Worsening Chronicity:  New Relieved by:  Nothing Worsened by:  Nothing Ineffective treatments:  None tried   Past Medical History:  Diagnosis Date  . Anemia    pt does not recall having this, CBC 05/2015 normal  . Anxiety and depression 07/03/2015   -sees psychiatry, [facility]   . Chronic low back pain 05/13/2014  . DCIS (ductal carcinoma in situ)    1992 dx during pregnancy, s/p lumpectomy remotely and no further treatment; benign biopsy in 2002  . Left ankle sprain    w/ lat mal avulsion fx in 2017; saw Dr. Victorino Dike  . Osteoporosis 07/03/2015   -seeing Dr. Kathi Ludwig -did not tolerate boniva   . Rheumatoid arthritis(714.0)    Dx in 2017, seeing Dr. Kathi Ludwig, Rheumatologist  . Sjogren's syndrome [facility])    Sees Rheumatologist   Past Surgical History:  Procedure Laterality Date  . ABCESS DRAINAGE  2012   sinus  . BREAST SURGERY  1992, 2002   biopsy  . LAPAROSCOPY  1990  . MOUTH RANULA EXCISION  1996  . ORIF FOOT FRACTURE  2007   left 5th metatarsal   Family History  Problem Relation Age of Onset  . Hyperlipidemia Mother   . Diabetes Mother   . Dementia Mother   . Parkinson's disease Mother   . Arthritis Father   . Hyperlipidemia Father   . Cancer Maternal Aunt        breast  . Arthritis Maternal Grandmother   . Arthritis Maternal Grandfather   . Arthritis Paternal Grandmother   . Diabetes Paternal Grandmother   . Arthritis Paternal  Grandfather    Social History  Substance Use Topics  . Smoking status: Current Some Day Smoker    Types: Cigarettes  . Smokeless tobacco: Never Used     Comment: 2 cigarette per day  . Alcohol use Yes   OB History    No data available     Review of Systems  Constitutional: Positive for fatigue.  HENT: Positive for congestion and rhinorrhea.   Eyes: Negative.   Respiratory: Positive for cough.   Cardiovascular: Negative.   Gastrointestinal: Negative.   Endocrine: Negative.   Genitourinary: Negative.   Musculoskeletal: Negative.   Allergic/Immunologic: Negative.   Neurological: Negative.   Hematological: Negative.   Psychiatric/Behavioral: Negative.     Allergies  Boniva [ibandronic acid] and Vectrin [minocycline hcl]  Home Medications   Prior to Admission medications   Medication Sig Start Date End Date Taking? Authorizing Provider  Adalimumab (HUMIRA PEN [place]) Inject 40 mg into the skin.    [provider]  benzonatate (TESSALON) 100 MG capsule Take 2 capsules (200 mg total) by mouth 3 (three) times daily as needed for cough. 08/31/16   Deatra Canter, FNP  Cholecalciferol (VITAMIN D PO) Take 2,000 Units by mouth daily.     [provider]  denosumab (PROLIA) 60 MG/ML SOLN injection Inject 60 mg into the skin every  6 (six) months. Administer in upper arm, thigh, or abdomen    [provider]  diclofenac sodium (VOLTAREN) 1 % GEL Apply 1 application topically 4 (four) times daily. 12/03/14   Hayden Rasmussen, NP  DULoxetine (CYMBALTA) 30 MG capsule TAKE 3 CAPSULES BY MOUTH IN THE MORNING 07/17/14   [provider]  fish oil-omega-3 fatty acids 1000 MG capsule Take 2 g by mouth daily.      [provider]  folic acid (FOLVITE) 1 MG tablet Take 2 tablets by mouth daily.     [provider]  ipratropium (ATROVENT) 0.06 % nasal spray Place 2 sprays into both nostrils 4 (four) times daily. 08/31/16   Deatra Canter, FNP   methotrexate (RHEUMATREX) 15 MG tablet Take 15 mg by mouth once a week. Caution: Chemotherapy. Protect from light.    [provider]  methylPREDNISolone (MEDROL DOSEPAK) 4 MG TBPK tablet Take 6-5-4-3-2-1 po qd 08/31/16   Deatra Canter, FNP  Multiple Vitamin (MULTIVITAMIN) tablet Take 1 tablet by mouth daily.      [provider]  naproxen sodium (ANAPROX) 220 MG tablet Take 220 mg by mouth as needed.     [provider]  NON FORMULARY Tumeric 500mg  twice a day    [provider]  Probiotic Product (PROBIOTIC DAILY PO) Take 1 capsule by mouth daily.    [provider]  vitamin C (ASCORBIC ACID) 500 MG tablet Take 500 mg by mouth daily.    [provider]   Meds Ordered and Administered this Visit  Medications - No data to display  BP 125/60 (BP Location: Left Arm)   Pulse 75   Temp 98 F (36.7 C) (Oral)   Resp 16   Ht 5' 1.75" (1.568 m)   Wt 120 lb (54.4 kg)   SpO2 98%   BMI 22.13 kg/m  No data found.   Physical Exam  Constitutional: She appears well-developed and well-nourished.  HENT:  Head: Normocephalic and atraumatic.  Right Ear: External ear normal.  Left Ear: External ear normal.  Mouth/Throat: Oropharynx is clear and moist.  Eyes: Pupils are equal, round, and reactive to light. Conjunctivae and EOM are normal.  Neck: Normal range of motion. Neck supple.  Cardiovascular: Normal rate, regular rhythm and normal heart sounds.   Pulmonary/Chest: Effort normal. She has wheezes.  Abdominal: Soft. Bowel sounds are normal.  Nursing note and vitals reviewed.   Urgent Care Course     Procedures (including critical care time)  Labs Review Labs Reviewed - No data to display  Imaging Review No results found.   Visual Acuity Review  Right Eye Distance:   Left Eye Distance:   Bilateral Distance:    Right Eye Near:   Left Eye Near:    Bilateral Near:         MDM   1. Bronchitis   2. Cough    Medrol  dose pack Tessalon Atrovent Nasal Spray  Push po fluids, rest, tylenol and motrin otc prn as directed for fever, arthralgias, and myalgias.  Follow up prn if sx's continue or persist.   , FNP 08/31/16 2025

## 2016-08-31 NOTE — ED Notes (Signed)
Patient verbalized understanding of discharge instructions and denies any further needs or questions at this time. VS stable. Patient ambulatory with steady gait.  

## 2016-09-07 DIAGNOSIS — F4322 Adjustment disorder with anxiety: Secondary | ICD-10-CM | POA: Diagnosis not present

## 2016-09-07 DIAGNOSIS — F331 Major depressive disorder, recurrent, moderate: Secondary | ICD-10-CM | POA: Diagnosis not present

## 2016-09-22 DIAGNOSIS — F4323 Adjustment disorder with mixed anxiety and depressed mood: Secondary | ICD-10-CM | POA: Diagnosis not present

## 2016-10-21 ENCOUNTER — Encounter: Payer: Self-pay | Admitting: Family Medicine

## 2016-11-03 DIAGNOSIS — F4323 Adjustment disorder with mixed anxiety and depressed mood: Secondary | ICD-10-CM | POA: Diagnosis not present

## 2016-11-16 ENCOUNTER — Ambulatory Visit (INDEPENDENT_AMBULATORY_CARE_PROVIDER_SITE_OTHER): Payer: Federal, State, Local not specified - PPO | Admitting: Family Medicine

## 2016-11-16 ENCOUNTER — Encounter: Payer: Federal, State, Local not specified - PPO | Admitting: Family Medicine

## 2016-11-16 ENCOUNTER — Encounter: Payer: Self-pay | Admitting: Family Medicine

## 2016-11-16 VITALS — BP 88/60 | HR 71 | Temp 98.3°F | Ht 61.75 in | Wt 119.2 lb

## 2016-11-16 DIAGNOSIS — F329 Major depressive disorder, single episode, unspecified: Secondary | ICD-10-CM

## 2016-11-16 DIAGNOSIS — Z0000 Encounter for general adult medical examination without abnormal findings: Secondary | ICD-10-CM | POA: Diagnosis not present

## 2016-11-16 DIAGNOSIS — F32A Depression, unspecified: Secondary | ICD-10-CM

## 2016-11-16 DIAGNOSIS — F419 Anxiety disorder, unspecified: Secondary | ICD-10-CM | POA: Diagnosis not present

## 2016-11-16 LAB — LIPID PANEL
Cholesterol: 262 mg/dL — ABNORMAL HIGH (ref 0–200)
HDL: 67.9 mg/dL (ref >39.00)
LDL Cholesterol: 173 mg/dL — ABNORMAL HIGH (ref 0–99)
NonHDL: 194.1
Total CHOL/HDL Ratio: 4
Triglycerides: 106 mg/dL (ref 0.0–149.0)
VLDL: 21.2 mg/dL (ref 0.0–40.0)

## 2016-11-16 LAB — HEMOGLOBIN A1C: Hgb A1c MFr Bld: 5.7 % (ref 4.6–6.5)

## 2016-11-16 MED ORDER — DULOXETINE HCL 30 MG PO CPEP: 60.0000 mg | ORAL_CAPSULE | Freq: Every day | ORAL | 1 refills | Status: DC

## 2016-11-16 MED ORDER — DULOXETINE HCL 60 MG PO CPEP: 60.0000 mg | ORAL_CAPSULE | Freq: Every day | ORAL | 1 refills | Status: DC

## 2016-11-16 NOTE — Patient Instructions (Addendum)
BEFORE YOU LEAVE: -phq9 -sleep article -follow up: 6 months  Please schedule your gynecology exam.  We have ordered labs or studies at this visit. It can take up to 1-2 weeks for results and processing. IF results require follow up or explanation, we will call you with instructions. Clinically stable results will be released to your [facility]. If you have not heard from [place] or cannot find your results in [facility] in 2 weeks please contact our office at [phone].  If you are not yet signed up for [facility], please consider signing up.   Health Maintenance for Postmenopausal Women Menopause is a normal process in which your reproductive ability comes to an end. This process happens gradually over a span of months to years, usually between the ages of 17 and 84. Menopause is complete when you have missed 12 consecutive menstrual periods. It is important to talk with your health care provider about some of the most common conditions that affect postmenopausal women, such as heart disease, cancer, and bone loss (osteoporosis). Adopting a healthy lifestyle and getting preventive care can help to promote your health and wellness. Those actions can also lower your chances of developing some of these common conditions. What should I know about menopause? During menopause, you may experience a number of symptoms, such as:  Moderate-to-severe hot flashes.  Night sweats.  Decrease in sex drive.  Mood swings.  Headaches.  Tiredness.  Irritability.  Memory problems.  Insomnia.  Choosing to treat or not to treat menopausal changes is an individual decision that you make with your health care provider. What should I know about hormone replacement therapy and supplements? Hormone therapy products are effective for treating symptoms that are associated with menopause, such as hot flashes and night sweats. Hormone replacement carries certain risks, especially as you become older. If you are thinking  about using estrogen or estrogen with progestin treatments, discuss the benefits and risks with your health care provider. What should I know about heart disease and stroke? Heart disease, heart attack, and stroke become more likely as you age. This may be due, in part, to the hormonal changes that your body experiences during menopause. These can affect how your body processes dietary fats, triglycerides, and cholesterol. Heart attack and stroke are both medical emergencies. There are many things that you can do to help prevent heart disease and stroke:  Have your blood pressure checked at least every 1-2 years. High blood pressure causes heart disease and increases the risk of stroke.  If you are 23-45 years old, ask your health care provider if you should take aspirin to prevent a heart attack or a stroke.  Do not use any tobacco products, including cigarettes, chewing tobacco, or electronic cigarettes. If you need help quitting, ask your health care provider.  It is important to eat a healthy diet and maintain a healthy weight. ? Be sure to include plenty of vegetables, fruits, low-fat dairy products, and lean protein. ? Avoid eating foods that are high in solid fats, added sugars, or salt (sodium).  Get regular exercise. This is one of the most important things that you can do for your health. ? Try to exercise for at least 150 minutes each week. The type of exercise that you do should increase your heart rate and make you sweat. This is known as moderate-intensity exercise. ? Try to do strengthening exercises at least twice each week. Do these in addition to the moderate-intensity exercise.  Know your numbers.Ask your health  care provider to check your cholesterol and your blood glucose. Continue to have your blood tested as directed by your health care provider.  What should I know about cancer screening? There are several types of cancer. Take the following steps to reduce your risk  and to catch any cancer development as early as possible. Breast Cancer  Practice breast self-awareness. ? This means understanding how your breasts normally appear and feel. ? It also means doing regular breast self-exams. Let your health care provider know about any changes, no matter how small.  If you are 51 or older, have a clinician do a breast exam (clinical breast exam or CBE) every year. Depending on your age, family history, and medical history, it may be recommended that you also have a yearly breast X-ray (mammogram).  If you have a family history of breast cancer, talk with your health care provider about genetic screening.  If you are at high risk for breast cancer, talk with your health care provider about having an MRI and a mammogram every year.  Breast cancer (BRCA) gene test is recommended for women who have family members with BRCA-related cancers. Results of the assessment will determine the need for genetic counseling and BRCA1 and for BRCA2 testing. BRCA-related cancers include these types: ? Breast. This occurs in males or females. ? Ovarian. ? Tubal. This may also be called fallopian tube cancer. ? Cancer of the abdominal or pelvic lining (peritoneal cancer). ? Prostate. ? Pancreatic.  Cervical, Uterine, and Ovarian Cancer Your health care provider may recommend that you be screened regularly for cancer of the pelvic organs. These include your ovaries, uterus, and vagina. This screening involves a pelvic exam, which includes checking for microscopic changes to the surface of your cervix (Pap test).  For women ages 21-65, health care providers may recommend a pelvic exam and a Pap test every three years. For women ages 75-65, they may recommend the Pap test and pelvic exam, combined with testing for human papilloma virus (HPV), every five years. Some types of HPV increase your risk of cervical cancer. Testing for HPV may also be done on women of any age who have  unclear Pap test results.  Other health care providers may not recommend any screening for nonpregnant women who are considered low risk for pelvic cancer and have no symptoms. Ask your health care provider if a screening pelvic exam is right for you.  If you have had past treatment for cervical cancer or a condition that could lead to cancer, you need Pap tests and screening for cancer for at least 20 years after your treatment. If Pap tests have been discontinued for you, your risk factors (such as having a new sexual partner) need to be reassessed to determine if you should start having screenings again. Some women have medical problems that increase the chance of getting cervical cancer. In these cases, your health care provider may recommend that you have screening and Pap tests more often.  If you have a family history of uterine cancer or ovarian cancer, talk with your health care provider about genetic screening.  If you have vaginal bleeding after reaching menopause, tell your health care provider.  There are currently no reliable tests available to screen for ovarian cancer.  Lung Cancer Lung cancer screening is recommended for adults 42-57 years old who are at high risk for lung cancer because of a history of smoking. A yearly low-dose CT scan of the lungs is recommended if you:  Currently smoke.  Have a history of at least 30 pack-years of smoking and you currently smoke or have quit within the past 15 years. A pack-year is smoking an average of one pack of cigarettes per day for one year.  Yearly screening should:  Continue until it has been 15 years since you quit.  Stop if you develop a health problem that would prevent you from having lung cancer treatment.  Colorectal Cancer  This type of cancer can be detected and can often be prevented.  Routine colorectal cancer screening usually begins at age 33 and continues through age 55.  If you have risk factors for colon  cancer, your health care provider may recommend that you be screened at an earlier age.  If you have a family history of colorectal cancer, talk with your health care provider about genetic screening.  Your health care provider may also recommend using home test kits to check for hidden blood in your stool.  A small camera at the end of a tube can be used to examine your colon directly (sigmoidoscopy or colonoscopy). This is done to check for the earliest forms of colorectal cancer.  Direct examination of the colon should be repeated every 5-10 years until age 8. However, if early forms of precancerous polyps or small growths are found or if you have a family history or genetic risk for colorectal cancer, you may need to be screened more often.  Skin Cancer  Check your skin from head to toe regularly.  Monitor any moles. Be sure to tell your health care provider: ? About any new moles or changes in moles, especially if there is a change in a mole's shape or color. ? If you have a mole that is larger than the size of a pencil eraser.  If any of your family members has a history of skin cancer, especially at a young age, talk with your health care provider about genetic screening.  Always use sunscreen. Apply sunscreen liberally and repeatedly throughout the day.  Whenever you are outside, protect yourself by wearing long sleeves, pants, a wide-brimmed hat, and sunglasses.  What should I know about osteoporosis? Osteoporosis is a condition in which bone destruction happens more quickly than new bone creation. After menopause, you may be at an increased risk for osteoporosis. To help prevent osteoporosis or the bone fractures that can happen because of osteoporosis, the following is recommended:  If you are 70-74 years old, get at least 1,000 mg of calcium and at least 600 mg of vitamin D per day.  If you are older than age 48 but younger than age 23, get at least 1,200 mg of calcium and  at least 600 mg of vitamin D per day.  If you are older than age 10, get at least 1,200 mg of calcium and at least 800 mg of vitamin D per day.  Smoking and excessive alcohol intake increase the risk of osteoporosis. Eat foods that are rich in calcium and vitamin D, and do weight-bearing exercises several times each week as directed by your health care provider. What should I know about how menopause affects my mental health? Depression may occur at any age, but it is more common as you become older. Common symptoms of depression include:  Low or sad mood.  Changes in sleep patterns.  Changes in appetite or eating patterns.  Feeling an overall lack of motivation or enjoyment of activities that you previously enjoyed.  Frequent crying spells.  Talk with your health care provider if you think that you are experiencing depression. What should I know about immunizations? It is important that you get and maintain your immunizations. These include:  Tetanus, diphtheria, and pertussis (Tdap) booster vaccine.  Influenza every year before the flu season begins.  Pneumonia vaccine.  Shingles vaccine.  Your health care provider may also recommend other immunizations. This information is not intended to replace advice given to you by your health care provider. Make sure you discuss any questions you have with your health care provider. Document Released: 03/12/2005 Document Revised: 08/08/2015 Document Reviewed: 10/22/2014 Elsevier Interactive Patient Education  2018 [employer] NOW OFFER   [facility] FAST TRACK!!!  SAME DAY Appointments for ACUTE CARE  Such as: Sprains, Injuries, cuts, abrasions, rashes, muscle pain, joint pain, back pain Colds, flu, sore throats, headache, allergies, cough, fever  Ear pain, sinus and eye infections Abdominal pain, nausea, vomiting, diarrhea, upset stomach Animal/insect bites  3 Easy Ways to Schedule: Walk-In Scheduling Call  in scheduling Mychart Sign-up: [email or web address]

## 2016-11-16 NOTE — Progress Notes (Addendum)
HPI:  Here for CPE:  Sees Dr. Benjie Karvonen in gyn for gynecologist appointments and exams. She wants me to prescribe her Cymbalta. She was not happy with her psychiatrist and does not plan to follow up back there. She does plan to continue with cognitive behavioral therapy with her counselor. Reports she is stable and doing well.  RA/Sjogren's/Osteoporosis/?fibromyalgia: -sees Dr. 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 -poor sleep a few days per month and this is very stressful for her -meds: cymbalta  -Diabetes and Dyslipidemia Screening: fasting  -Hx of HTN: no  -Vaccines: UTD, discussed shingles vaccine  -pap history: sees gyn for this  -FDLMP: n/a  -sexual activity: yes, female partner, no new partners  -wants STI testing (Hep C if born [birth year]): no  -FH breast, colon or ovarian ca: see FH Last mammogram: does yearly and agrees to schedule Last colon cancer screening: 02/2015 negative cologuard  Breast Ca Risk Assessment: -sees gyn for breast exams  -Alcohol, Tobacco, drug use: see social history  Review of Systems - no fevers, unintentional weight loss, vision loss, hearing loss, chest pain, sob, hemoptysis, melena, hematochezia, hematuria, genital discharge, changing or concerning skin lesions, bleeding, bruising, loc, thoughts of self harm or SI  Past Medical History:  Diagnosis Date  . Anemia    pt does not recall having this, CBC 05/2015 normal  . Anxiety and depression 07/03/2015   -sees psychiatry, Metta Clines   . Chronic low back pain 05/13/2014  . DCIS (ductal carcinoma in situ)    1992 dx during pregnancy, s/p lumpectomy remotely and no further treatment; benign biopsy in 2002  . Left ankle sprain    w/ lat mal avulsion fx in 2017; saw Dr. Doran Durand  . Osteoporosis 07/03/2015   -seeing Dr. Dossie Der -did not tolerate boniva   . Rheumatoid arthritis(714.0)    Dx in 2017, seeing Dr. Dossie Der, Rheumatologist  . Sjogren's syndrome [facility])    Sees Rheumatologist    Past Surgical History:  Procedure Laterality Date  . ABCESS DRAINAGE  2012   sinus  . BREAST SURGERY  1992, 2002   biopsy  . LAPAROSCOPY  1990  . MOUTH RANULA EXCISION  1996  . ORIF FOOT FRACTURE  2007   left 5th metatarsal    Family History  Problem Relation Age of Onset  . Hyperlipidemia Mother   . Diabetes Mother   . Dementia Mother   . Parkinson's disease Mother   . Arthritis Father   . Hyperlipidemia Father   . Cancer Maternal Aunt        breast  . Arthritis Maternal Grandmother   . Arthritis Maternal Grandfather   . Arthritis Paternal Grandmother   . Diabetes Paternal Grandmother   . Arthritis Paternal Grandfather     Social History   Social History  . Marital status: Married    Spouse name: N/A  . Number of children: 2  . Years of education: N/A   Occupational History  . RN [employer]   Social History Main Topics  . Smoking status: Current Some Day Smoker    Types: Cigarettes  . Smokeless tobacco: Never Used  Comment: 2 cigarette per day  . Alcohol use Yes  . Drug use: No  . Sexual activity: Not Asked   Other Topics Concern  . None   Social History Narrative   Work or School: Therapist, sports, 3 days per week      Home Situation: lives with husband - he is sick with prostate ca; adopted son and daughter is Oncologist      Spiritual Beliefs: Catholic - very active in the faith      Lifestyle: walking on a regular basis; diet is healthy      Drinks very little red wine, uses caution.           Current Outpatient Prescriptions:  .  Cholecalciferol (VITAMIN D PO), Take 2,000 Units  by mouth daily. , Disp: , Rfl:  .  denosumab (PROLIA) 60 MG/ML SOLN injection, Inject 60 mg into the skin every 6 (six) months. Administer in upper arm, thigh, or abdomen, Disp: , Rfl:  .  diclofenac sodium (VOLTAREN) 1 % GEL, Apply 1 application topically 4 (four) times daily., Disp: 100 g, Rfl: 0 .  DULoxetine (CYMBALTA) 30 MG capsule, Take 2 capsules (60 mg total) by mouth daily., Disp: 180 capsule, Rfl: 1 .  fish oil-omega-3 fatty acids 1000 MG capsule, Take 2 g by mouth daily.  , Disp: , Rfl:  .  folic acid (FOLVITE) 1 MG tablet, Take 2 tablets by mouth daily. , Disp: , Rfl:  .  Multiple Vitamin (MULTIVITAMIN) tablet, Take 1 tablet by mouth daily.  , Disp: , Rfl:  .  naproxen sodium (ANAPROX) 220 MG tablet, Take 220 mg by mouth as needed. , Disp: , Rfl:  .  NON FORMULARY, Tumeric 56m twice a day, Disp: , Rfl:  .  Probiotic Product (PROBIOTIC DAILY PO), Take 1 capsule by mouth daily., Disp: , Rfl:  .  vitamin C (ASCORBIC ACID) 500 MG tablet, Take 500 mg by mouth daily., Disp: , Rfl:  .  DULoxetine (CYMBALTA) 60 MG capsule, Take 1 capsule (60 mg total) by mouth daily., Disp: 90 capsule, Rfl: 1  EXAM:  Vitals:   11/16/16 1045  BP: (!) 88/60  Pulse: 71  Temp: 98.3 F (36.8 C)    GENERAL: vitals reviewed and listed below, alert, oriented, appears well hydrated and in no acute distress  HEENT: head atraumatic, PERRLA, normal appearance of eyes, ears, nose and mouth. moist mucus membranes.  NECK: supple, no masses or lymphadenopathy  LUNGS: clear to auscultation bilaterally, no rales, rhonchi or wheeze  CV: HRRR, no peripheral edema or cyanosis, normal pedal pulses  ABDOMEN: bowel sounds normal, soft, non tender to palpation, no masses, no rebound or guarding  SKIN: no rash or abnormal lesions, declined, reports she was seeing a dermatologist  GU/Breast: declined, does with GYN  MS: normal gait, moves all extremities normally  NEURO: normal gait, speech and thought processing  grossly intact, muscle tone grossly intact throughout  PSYCH: normal affect, pleasant and cooperative  ASSESSMENT AND PLAN:  Discussed the following assessment and plan:  Encounter for preventative adult health care examination - Plan: Lipid panel, Hemoglobin A1c  Anxiety and depression -See PHQ9, counseled > 7/14 minutes and article on sleep provided -encouraged continued CBT -refilled cymbalta and corrected dosing to what she is taking -discussed tx of sleep  -Discussed and advised all [place] [place] health task force level A and B recommendations for age, sex and risks.  -Advised at least 150 minutes of exercise per week and a  healthy diet with avoidance of (less then 1 serving per week) processed foods, white starches, red meat, fast foods and sweets and consisting of: * 5-9 servings of fresh fruits and vegetables (not corn or potatoes) *nuts and seeds, beans *olives and olive oil *lean meats such as fish and white chicken  *whole grains  -refilled Cymbalta  -labs, studies and vaccines per orders this encounter  Orders Placed This Encounter  Procedures  . Lipid panel  . Hemoglobin A1c    Patient advised to return to clinic immediately if symptoms worsen or persist or new concerns.  Patient Instructions  BEFORE YOU LEAVE: -phq9 -sleep article -follow up: 6 months  Please schedule your gynecology exam.  We have ordered labs or studies at this visit. It can take up to 1-2 weeks for results and processing. IF results require follow up or explanation, we will call you with instructions. Clinically stable results will be released to your [facility]. If you have not heard from [place] or cannot find your results in [facility] in 2 weeks please contact our office at [phone].  If you are not yet signed up for [facility], please consider signing up.   Health Maintenance for Postmenopausal Women Menopause is a normal process in which your reproductive ability comes to an  end. This process happens gradually over a span of months to years, usually between the ages of 70 and 14. Menopause is complete when you have missed 12 consecutive menstrual periods. It is important to talk with your health care provider about some of the most common conditions that affect postmenopausal women, such as heart disease, cancer, and bone loss (osteoporosis). Adopting a healthy lifestyle and getting preventive care can help to promote your health and wellness. Those actions can also lower your chances of developing some of these common conditions. What should I know about menopause? During menopause, you may experience a number of symptoms, such as:  Moderate-to-severe hot flashes.  Night sweats.  Decrease in sex drive.  Mood swings.  Headaches.  Tiredness.  Irritability.  Memory problems.  Insomnia.  Choosing to treat or not to treat menopausal changes is an individual decision that you make with your health care provider. What should I know about hormone replacement therapy and supplements? Hormone therapy products are effective for treating symptoms that are associated with menopause, such as hot flashes and night sweats. Hormone replacement carries certain risks, especially as you become older. If you are thinking about using estrogen or estrogen with progestin treatments, discuss the benefits and risks with your health care provider. What should I know about heart disease and stroke? Heart disease, heart attack, and stroke become more likely as you age. This may be due, in part, to the hormonal changes that your body experiences during menopause. These can affect how your body processes dietary fats, triglycerides, and cholesterol. Heart attack and stroke are both medical emergencies. There are many things that you can do to help prevent heart disease and stroke:  Have your blood pressure checked at least every 1-2 years. High blood pressure causes heart disease and  increases the risk of stroke.  If you are 41-84 years old, ask your health care provider if you should take aspirin to prevent a heart attack or a stroke.  Do not use any tobacco products, including cigarettes, chewing tobacco, or electronic cigarettes. If you need help quitting, ask your health care provider.  It is important to eat a healthy diet and maintain a healthy weight. ? Be  sure to include plenty of vegetables, fruits, low-fat dairy products, and lean protein. ? Avoid eating foods that are high in solid fats, added sugars, or salt (sodium).  Get regular exercise. This is one of the most important things that you can do for your health. ? Try to exercise for at least 150 minutes each week. The type of exercise that you do should increase your heart rate and make you sweat. This is known as moderate-intensity exercise. ? Try to do strengthening exercises at least twice each week. Do these in addition to the moderate-intensity exercise.  Know your numbers.Ask your health care provider to check your cholesterol and your blood glucose. Continue to have your blood tested as directed by your health care provider.  What should I know about cancer screening? There are several types of cancer. Take the following steps to reduce your risk and to catch any cancer development as early as possible. Breast Cancer  Practice breast self-awareness. ? This means understanding how your breasts normally appear and feel. ? It also means doing regular breast self-exams. Let your health care provider know about any changes, no matter how small.  If you are 34 or older, have a clinician do a breast exam (clinical breast exam or CBE) every year. Depending on your age, family history, and medical history, it may be recommended that you also have a yearly breast X-ray (mammogram).  If you have a family history of breast cancer, talk with your health care provider about genetic screening.  If you are at  high risk for breast cancer, talk with your health care provider about having an MRI and a mammogram every year.  Breast cancer (BRCA) gene test is recommended for women who have family members with BRCA-related cancers. Results of the assessment will determine the need for genetic counseling and BRCA1 and for BRCA2 testing. BRCA-related cancers include these types: ? Breast. This occurs in males or females. ? Ovarian. ? Tubal. This may also be called fallopian tube cancer. ? Cancer of the abdominal or pelvic lining (peritoneal cancer). ? Prostate. ? Pancreatic.  Cervical, Uterine, and Ovarian Cancer Your health care provider may recommend that you be screened regularly for cancer of the pelvic organs. These include your ovaries, uterus, and vagina. This screening involves a pelvic exam, which includes checking for microscopic changes to the surface of your cervix (Pap test).  For women ages 21-65, health care providers may recommend a pelvic exam and a Pap test every three years. For women ages 50-65, they may recommend the Pap test and pelvic exam, combined with testing for human papilloma virus (HPV), every five years. Some types of HPV increase your risk of cervical cancer. Testing for HPV may also be done on women of any age who have unclear Pap test results.  Other health care providers may not recommend any screening for nonpregnant women who are considered low risk for pelvic cancer and have no symptoms. Ask your health care provider if a screening pelvic exam is right for you.  If you have had past treatment for cervical cancer or a condition that could lead to cancer, you need Pap tests and screening for cancer for at least 20 years after your treatment. If Pap tests have been discontinued for you, your risk factors (such as having a new sexual partner) need to be reassessed to determine if you should start having screenings again. Some women have medical problems that increase the chance  of getting cervical cancer. In  these cases, your health care provider may recommend that you have screening and Pap tests more often.  If you have a family history of uterine cancer or ovarian cancer, talk with your health care provider about genetic screening.  If you have vaginal bleeding after reaching menopause, tell your health care provider.  There are currently no reliable tests available to screen for ovarian cancer.  Lung Cancer Lung cancer screening is recommended for adults 65-64 years old who are at high risk for lung cancer because of a history of smoking. A yearly low-dose CT scan of the lungs is recommended if you:  Currently smoke.  Have a history of at least 30 pack-years of smoking and you currently smoke or have quit within the past 15 years. A pack-year is smoking an average of one pack of cigarettes per day for one year.  Yearly screening should:  Continue until it has been 15 years since you quit.  Stop if you develop a health problem that would prevent you from having lung cancer treatment.  Colorectal Cancer  This type of cancer can be detected and can often be prevented.  Routine colorectal cancer screening usually begins at age 79 and continues through age 58.  If you have risk factors for colon cancer, your health care provider may recommend that you be screened at an earlier age.  If you have a family history of colorectal cancer, talk with your health care provider about genetic screening.  Your health care provider may also recommend using home test kits to check for hidden blood in your stool.  A small camera at the end of a tube can be used to examine your colon directly (sigmoidoscopy or colonoscopy). This is done to check for the earliest forms of colorectal cancer.  Direct examination of the colon should be repeated every 5-10 years until age 65. However, if early forms of precancerous polyps or small growths are found or if you have a family  history or genetic risk for colorectal cancer, you may need to be screened more often.  Skin Cancer  Check your skin from head to toe regularly.  Monitor any moles. Be sure to tell your health care provider: ? About any new moles or changes in moles, especially if there is a change in a mole's shape or color. ? If you have a mole that is larger than the size of a pencil eraser.  If any of your family members has a history of skin cancer, especially at a young age, talk with your health care provider about genetic screening.  Always use sunscreen. Apply sunscreen liberally and repeatedly throughout the day.  Whenever you are outside, protect yourself by wearing long sleeves, pants, a wide-brimmed hat, and sunglasses.  What should I know about osteoporosis? Osteoporosis is a condition in which bone destruction happens more quickly than new bone creation. After menopause, you may be at an increased risk for osteoporosis. To help prevent osteoporosis or the bone fractures that can happen because of osteoporosis, the following is recommended:  If you are 68-79 years old, get at least 1,000 mg of calcium and at least 600 mg of vitamin D per day.  If you are older than age 60 but younger than age 33, get at least 1,200 mg of calcium and at least 600 mg of vitamin D per day.  If you are older than age 59, get at least 1,200 mg of calcium and at least 800 mg of vitamin D per  day.  Smoking and excessive alcohol intake increase the risk of osteoporosis. Eat foods that are rich in calcium and vitamin D, and do weight-bearing exercises several times each week as directed by your health care provider. What should I know about how menopause affects my mental health? Depression may occur at any age, but it is more common as you become older. Common symptoms of depression include:  Low or sad mood.  Changes in sleep patterns.  Changes in appetite or eating patterns.  Feeling an overall lack of  motivation or enjoyment of activities that you previously enjoyed.  Frequent crying spells.  Talk with your health care provider if you think that you are experiencing depression. What should I know about immunizations? It is important that you get and maintain your immunizations. These include:  Tetanus, diphtheria, and pertussis (Tdap) booster vaccine.  Influenza every year before the flu season begins.  Pneumonia vaccine.  Shingles vaccine.  Your health care provider may also recommend other immunizations. This information is not intended to replace advice given to you by your health care provider. Make sure you discuss any questions you have with your health care provider. Document Released: 03/12/2005 Document Revised: 08/08/2015 Document Reviewed: 10/22/2014 Elsevier Interactive Patient Education  2018 [facility] NOW OFFER   [facility] FAST TRACK!!!  SAME DAY Appointments for ACUTE CARE  Such as: Sprains, Injuries, cuts, abrasions, rashes, muscle pain, joint pain, back pain Colds, flu, sore throats, headache, allergies, cough, fever  Ear pain, sinus and eye infections Abdominal pain, nausea, vomiting, diarrhea, upset stomach Animal/insect bites  3 Easy Ways to Schedule: Walk-In Scheduling Call in scheduling Mychart Sign-up: [email or web address]               No Follow-up on file.  Colin Benton R., DO

## 2016-12-10 DIAGNOSIS — F4323 Adjustment disorder with mixed anxiety and depressed mood: Secondary | ICD-10-CM | POA: Diagnosis not present

## 2017-01-21 DIAGNOSIS — F4323 Adjustment disorder with mixed anxiety and depressed mood: Secondary | ICD-10-CM | POA: Diagnosis not present

## 2017-02-10 ENCOUNTER — Encounter: Payer: Self-pay | Admitting: Family Medicine

## 2017-02-23 DIAGNOSIS — F4323 Adjustment disorder with mixed anxiety and depressed mood: Secondary | ICD-10-CM | POA: Diagnosis not present

## 2017-03-16 DIAGNOSIS — M79671 Pain in right foot: Secondary | ICD-10-CM | POA: Diagnosis not present

## 2017-03-16 DIAGNOSIS — M79641 Pain in right hand: Secondary | ICD-10-CM | POA: Diagnosis not present

## 2017-03-16 DIAGNOSIS — M25571 Pain in right ankle and joints of right foot: Secondary | ICD-10-CM | POA: Diagnosis not present

## 2017-03-16 DIAGNOSIS — M19072 Primary osteoarthritis, left ankle and foot: Secondary | ICD-10-CM | POA: Diagnosis not present

## 2017-03-16 DIAGNOSIS — M19071 Primary osteoarthritis, right ankle and foot: Secondary | ICD-10-CM | POA: Diagnosis not present

## 2017-03-16 DIAGNOSIS — M79672 Pain in left foot: Secondary | ICD-10-CM | POA: Diagnosis not present

## 2017-03-16 DIAGNOSIS — M3500 Sicca syndrome, unspecified: Secondary | ICD-10-CM | POA: Diagnosis not present

## 2017-03-16 DIAGNOSIS — M0579 Rheumatoid arthritis with rheumatoid factor of multiple sites without organ or systems involvement: Secondary | ICD-10-CM | POA: Diagnosis not present

## 2017-03-16 DIAGNOSIS — M810 Age-related osteoporosis without current pathological fracture: Secondary | ICD-10-CM | POA: Diagnosis not present

## 2017-03-16 DIAGNOSIS — Z79899 Other long term (current) drug therapy: Secondary | ICD-10-CM | POA: Diagnosis not present

## 2017-03-16 DIAGNOSIS — M79642 Pain in left hand: Secondary | ICD-10-CM | POA: Diagnosis not present

## 2017-03-16 DIAGNOSIS — M25572 Pain in left ankle and joints of left foot: Secondary | ICD-10-CM | POA: Diagnosis not present

## 2017-03-16 DIAGNOSIS — M19041 Primary osteoarthritis, right hand: Secondary | ICD-10-CM | POA: Diagnosis not present

## 2017-03-28 DIAGNOSIS — F4323 Adjustment disorder with mixed anxiety and depressed mood: Secondary | ICD-10-CM | POA: Diagnosis not present

## 2017-04-11 DIAGNOSIS — F4323 Adjustment disorder with mixed anxiety and depressed mood: Secondary | ICD-10-CM | POA: Diagnosis not present

## 2017-05-22 NOTE — Progress Notes (Signed)
HPI:  Using dictation device. Unfortunately this device frequently misinterprets words/phrases.  Andrea Fields is a pleasant 62 y.o. here for follow up. Chronic medical problems summarized below were reviewed for changes.  She underwent fortunately he has had a rough few months.  Husband is sick and deals with issues with her children.  She moved recently and that was challenging.  She continues to take the Cymbalta and does wish to continue.  Otherwise she plans to get some cognitive behavioral therapy and has an appointment set up.  Does not feel she needs other treatment.  Not fasting today.  She has several other small issues.  Had a stye on the left eye that turned into a chronic lump.  She has a recurrent scaly spot on her right ear on the top of the ear.  This is been going on for some time.  She was bit by a tick recently when she was visiting in [place].  The tick was on for less than 12 hours and she removed it completely.  No symptoms.  Denies CP, SOB, DOE, treatment intolerance or new symptoms.  Due for lipid panel (last check 11/2016 262, T 106, 67, 173 , 4)  CPE 11/16/16 Sees Dr Juliene Pina for pap smears  Hyperlipidemia: -advised lifestyle changes and consideration medication  RA/Sjogren's/Osteoporosis/?fibromyalgia: -sees Dr. 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  Past Medical History:  Diagnosis Date  . Anemia    pt does not recall having this, CBC 05/2015 normal   . Anxiety and depression 07/03/2015   -sees psychiatry, [facility]   . Chronic low back pain 05/13/2014  . DCIS (ductal carcinoma in situ)    1992 dx during pregnancy, s/p lumpectomy remotely and no further treatment; benign biopsy in 2002  . Left ankle sprain    w/ lat mal avulsion fx in 2017; saw Dr. Victorino Dike  . Osteoporosis 07/03/2015   -seeing Dr. Kathi Ludwig -did not tolerate boniva   . Rheumatoid arthritis(714.0)    Dx in 2017, seeing Dr. Kathi Ludwig, Rheumatologist  . Sjogren's syndrome [facility])    Sees Rheumatologist    Past Surgical History:  Procedure Laterality Date  . ABCESS DRAINAGE  2012   sinus  . BREAST SURGERY  1992, 2002   biopsy  . LAPAROSCOPY  1990  . MOUTH RANULA EXCISION  1996  . ORIF FOOT FRACTURE  2007   left 5th metatarsal    Family History  Problem Relation Age of Onset  . Hyperlipidemia Mother   . Diabetes Mother   . Dementia Mother   . Parkinson's disease Mother   . Arthritis Father   . Hyperlipidemia Father   . Cancer Maternal Aunt        breast  . Arthritis Maternal Grandmother   . Arthritis Maternal Grandfather   . Arthritis Paternal Grandmother   . Diabetes Paternal Grandmother   . Arthritis Paternal Grandfather     SOCIAL HX: see above   Current Outpatient Medications:  .  Cholecalciferol (VITAMIN D PO), Take 2,000 Units by mouth daily. , Disp: , Rfl:  .  denosumab (PROLIA) 60 MG/ML SOLN injection, Inject 60 mg into the skin every 6 (six) months. Administer in upper arm, thigh, or abdomen, Disp: , Rfl:  .  diclofenac sodium (VOLTAREN) 1 % GEL, Apply 1 application topically 4 (four) times daily., Disp: 100 g, Rfl: 0 .  DULoxetine (CYMBALTA) 60 MG capsule, Take 1 capsule (60 mg total) by mouth daily., Disp: 90 capsule, Rfl: 1 .  fish oil-omega-3 fatty acids 1000 MG capsule, Take 2 g by mouth daily.  , Disp: , Rfl:  .  Multiple Vitamin (MULTIVITAMIN) tablet, Take 1 tablet by mouth daily.  , Disp: , Rfl:  .  naproxen sodium (ANAPROX) 220 MG tablet, Take  220 mg by mouth as needed. , Disp: , Rfl:  .  NON FORMULARY, Tumeric 500mg  twice a day, Disp: , Rfl:  .  Probiotic Product (PROBIOTIC DAILY PO), Take 1 capsule by mouth daily., Disp: , Rfl:  .  vitamin C (ASCORBIC ACID) 500 MG tablet, Take 500 mg by mouth daily., Disp: , Rfl:   EXAM:  Vitals:   05/24/17 1018  BP: 110/78  Pulse: 71  Temp: 98.4 F (36.9 C)    Body mass index is 21.3 kg/m.  GENERAL: vitals reviewed and listed above, alert, oriented, appears well hydrated and in no acute distress  HEENT: atraumatic, conjunttiva clear, small flesh-colored bump on the left upper eyelid, no obvious abnormalities on inspection of external nose and ears  NECK: no obvious masses on inspection  LUNGS: clear to auscultation bilaterally, no wheezes, rales or rhonchi, good air movement  CV: HRRR, no peripheral edema  MS: moves all extremities without noticeable abnormality  SK small hyperkeratotic papule on the top of the right ear  PSYCH: pleasant and cooperative, no obvious depression or anxiety  ASSESSMENT AND PLAN:  Discussed the following assessment and plan:  Recurrent major depressive disorder, in partial remission (HCC) -Start CBT, glad she has already scheduled this  -continue Cymbalta -Follow-up as needed, counseled and supported  Skin lesion -Advised dermatology evaluation, discussed potential for AK versus skin cancer -She sees dermatologist and agrees  will schedule appointment  Chalazion of left upper eyelid Sees ophthalmologist, she agrees to schedule an appointment  Hyperlipidemia, unspecified hyperlipidemia type -Labs, ordered, she plans to check had a fasting lab visit  Rheumatoid arthritis involving multiple sites, unspecified rheumatoid factor presence (HCC) -See specialist for management  Tick bite Discussed tickborne illnesses and signs and symptoms Advised to seek care immediately if any signs and symptoms develop  -Patient advised to return or  notify a doctor immediately if symptoms worsen or persist or new concerns arise.  Patient Instructions  BEFORE YOU LEAVE: -follow up:  1) lab appointment in next few weeks 2) follow up in about 4-6 months  Hang in there!    05/26/17, DO

## 2017-05-24 ENCOUNTER — Ambulatory Visit: Payer: Federal, State, Local not specified - PPO | Admitting: Family Medicine

## 2017-05-24 ENCOUNTER — Encounter: Payer: Self-pay | Admitting: Family Medicine

## 2017-05-24 VITALS — BP 110/78 | HR 71 | Temp 98.4°F | Ht 61.75 in | Wt 115.5 lb

## 2017-05-24 DIAGNOSIS — F439 Reaction to severe stress, unspecified: Secondary | ICD-10-CM

## 2017-05-24 DIAGNOSIS — H0014 Chalazion left upper eyelid: Secondary | ICD-10-CM

## 2017-05-24 DIAGNOSIS — L989 Disorder of the skin and subcutaneous tissue, unspecified: Secondary | ICD-10-CM | POA: Diagnosis not present

## 2017-05-24 DIAGNOSIS — E785 Hyperlipidemia, unspecified: Secondary | ICD-10-CM

## 2017-05-24 DIAGNOSIS — M069 Rheumatoid arthritis, unspecified: Secondary | ICD-10-CM

## 2017-05-24 DIAGNOSIS — F3341 Major depressive disorder, recurrent, in partial remission: Secondary | ICD-10-CM

## 2017-05-24 NOTE — Patient Instructions (Signed)
BEFORE YOU LEAVE: -follow up:  1) lab appointment in next few weeks 2) follow up in about 4-6 months  Hang in there!

## 2017-05-26 DIAGNOSIS — F4323 Adjustment disorder with mixed anxiety and depressed mood: Secondary | ICD-10-CM | POA: Diagnosis not present

## 2017-06-02 ENCOUNTER — Other Ambulatory Visit: Payer: Self-pay | Admitting: *Deleted

## 2017-06-02 DIAGNOSIS — E785 Hyperlipidemia, unspecified: Secondary | ICD-10-CM

## 2017-06-07 ENCOUNTER — Other Ambulatory Visit (INDEPENDENT_AMBULATORY_CARE_PROVIDER_SITE_OTHER): Payer: Federal, State, Local not specified - PPO

## 2017-06-07 DIAGNOSIS — E785 Hyperlipidemia, unspecified: Secondary | ICD-10-CM | POA: Diagnosis not present

## 2017-06-07 LAB — LIPID PANEL
Cholesterol: 249 mg/dL — ABNORMAL HIGH (ref 0–200)
HDL: 60.7 mg/dL (ref >39.00)
LDL Cholesterol: 162 mg/dL — ABNORMAL HIGH (ref 0–99)
NonHDL: 187.91
Total CHOL/HDL Ratio: 4
Triglycerides: 129 mg/dL (ref 0.0–149.0)
VLDL: 25.8 mg/dL (ref 0.0–40.0)

## 2017-07-04 DIAGNOSIS — K080 Exfoliation of teeth due to systemic causes: Secondary | ICD-10-CM | POA: Diagnosis not present

## 2017-07-11 DIAGNOSIS — H43811 Vitreous degeneration, right eye: Secondary | ICD-10-CM | POA: Diagnosis not present

## 2017-07-14 ENCOUNTER — Telehealth: Payer: Self-pay | Admitting: Family Medicine

## 2017-07-14 MED ORDER — DULOXETINE HCL 60 MG PO CPEP: 60.0000 mg | ORAL_CAPSULE | Freq: Every day | ORAL | 0 refills | Status: DC

## 2017-07-14 NOTE — Telephone Encounter (Signed)
Copied from CRM [id number]. Topic: Quick Communication - Rx Refill/Question >> Jul 14, 2017 11:16 AM Crist Infante wrote: Medication DULoxetine (CYMBALTA) 60 MG capsule [facility] [place] [facility] ask [facility] 7 day supply of this med, as pt is on vacation and left med at home.   [facility] [place] Phone [phone] Kriste Basque : pharmacist

## 2017-07-14 NOTE — Telephone Encounter (Signed)
Rx done and I left a detailed message with this info at her cell number.

## 2017-07-14 NOTE — Telephone Encounter (Signed)
Please call pt. Ok with me if you know how to send? Please send or call pharmacy.

## 2017-07-14 NOTE — Telephone Encounter (Signed)
Relation to pt: self  Call back number:[phone] Pharmacy: [facility]  [address] [place], [place] [address] [phone]    Reason for call:  Patient would like a follow up call today regarding the status of 7 day supply due to her forgetting her medication at home, informed patient PCP and nurse are in clinic but will follow up, please advise

## 2017-09-21 ENCOUNTER — Other Ambulatory Visit: Payer: Self-pay | Admitting: Family Medicine

## 2017-09-22 MED ORDER — DULOXETINE HCL 60 MG PO CPEP: ORAL_CAPSULE | ORAL | 1 refills | Status: DC

## 2017-09-22 NOTE — Telephone Encounter (Signed)
Rx done. 

## 2017-09-22 NOTE — Addendum Note (Signed)
Addended by: Johnella Moloney on: 09/22/2017 08:08 AM   Modules accepted: Orders

## 2017-10-05 DIAGNOSIS — F4323 Adjustment disorder with mixed anxiety and depressed mood: Secondary | ICD-10-CM | POA: Diagnosis not present

## 2017-10-12 DIAGNOSIS — M810 Age-related osteoporosis without current pathological fracture: Secondary | ICD-10-CM | POA: Diagnosis not present

## 2017-10-12 DIAGNOSIS — M0579 Rheumatoid arthritis with rheumatoid factor of multiple sites without organ or systems involvement: Secondary | ICD-10-CM | POA: Diagnosis not present

## 2017-10-12 DIAGNOSIS — M79641 Pain in right hand: Secondary | ICD-10-CM | POA: Diagnosis not present

## 2017-10-12 DIAGNOSIS — M3500 Sicca syndrome, unspecified: Secondary | ICD-10-CM | POA: Diagnosis not present

## 2017-10-12 DIAGNOSIS — Z79899 Other long term (current) drug therapy: Secondary | ICD-10-CM | POA: Diagnosis not present

## 2017-10-18 ENCOUNTER — Other Ambulatory Visit: Payer: Self-pay | Admitting: Family Medicine

## 2017-10-18 ENCOUNTER — Other Ambulatory Visit: Payer: Self-pay | Admitting: Rheumatology

## 2017-10-18 DIAGNOSIS — Z1231 Encounter for screening mammogram for malignant neoplasm of breast: Secondary | ICD-10-CM

## 2017-10-18 DIAGNOSIS — M810 Age-related osteoporosis without current pathological fracture: Secondary | ICD-10-CM

## 2017-11-09 ENCOUNTER — Ambulatory Visit
Admission: RE | Admit: 2017-11-09 | Discharge: 2017-11-09 | Disposition: A | Discharge status: Home / Self Care | Payer: Federal, State, Local not specified - PPO | Source: Ambulatory Visit | Attending: Family Medicine | Admitting: Family Medicine

## 2017-11-09 ENCOUNTER — Ambulatory Visit
Admission: RE | Admit: 2017-11-09 | Discharge: 2017-11-09 | Disposition: A | Discharge status: Home / Self Care | Payer: Federal, State, Local not specified - PPO | Source: Ambulatory Visit | Attending: Rheumatology | Admitting: Rheumatology

## 2017-11-09 DIAGNOSIS — M810 Age-related osteoporosis without current pathological fracture: Secondary | ICD-10-CM

## 2017-11-09 DIAGNOSIS — Z1231 Encounter for screening mammogram for malignant neoplasm of breast: Secondary | ICD-10-CM

## 2017-11-09 DIAGNOSIS — Z780 Asymptomatic menopausal state: Secondary | ICD-10-CM | POA: Diagnosis not present

## 2017-11-11 DIAGNOSIS — H1789 Other corneal scars and opacities: Secondary | ICD-10-CM | POA: Diagnosis not present

## 2017-11-11 DIAGNOSIS — H2513 Age-related nuclear cataract, bilateral: Secondary | ICD-10-CM | POA: Diagnosis not present

## 2017-11-11 DIAGNOSIS — H5213 Myopia, bilateral: Secondary | ICD-10-CM | POA: Diagnosis not present

## 2017-11-17 ENCOUNTER — Ambulatory Visit: Payer: Federal, State, Local not specified - PPO

## 2017-11-19 NOTE — Progress Notes (Signed)
HPI:  Using dictation device. Unfortunately this device frequently misinterprets words/phrases.  Andrea Fields is a pleasant 62 y.o. here for follow up. Chronic medical problems summarized below were reviewed for changes and stability and were updated as needed below. These issues and their treatment remain stable for the most part.  Unfortunately, her husband is in hospice and this is been hard on her.  He has hearing difficulties.  She recently started counseling for herself through hospice services in [place] and this is helping.  This is just hard.  She is already grieving.  She is not sure that she wants to take a cholesterol medication for the cholesterol.  Prefers not to check today.  She is interested in diet changes and may consider if not improving. Denies CP, SOB, DOE, treatment intolerance or new symptoms. Due for flu vaccine, labs - lipids Wants her flu shot and her shingles vaccine today  CPE 11/16/16 Sees Dr Juliene Pina for pap smears  Hyperlipidemia: -advised lifestyle changes and consideration medication  RA/Sjogren's/Osteoporosis/?fibromyalgia: -sees Dr. 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  Past Medical History:  Diagnosis Date  . Anemia    pt does not recall having this, CBC 05/2015 normal  . Anxiety and depression 07/03/2015   -sees psychiatry, [facility]   . Chronic low back pain 05/13/2014  . DCIS (ductal carcinoma in situ)    1992 dx during  pregnancy, s/p lumpectomy remotely and no further treatment; benign biopsy in 2002  . Left ankle sprain    w/ lat mal avulsion fx in 2017; saw Dr. Victorino Dike  . Osteoporosis 07/03/2015   -seeing Dr. Kathi Ludwig -did not tolerate boniva   . Rheumatoid arthritis(714.0)    Dx in 2017, seeing Dr. Kathi Ludwig, Rheumatologist  . Sjogren's syndrome [facility])    Sees Rheumatologist    Past Surgical History:  Procedure Laterality Date  . ABCESS DRAINAGE  2012   sinus  . BREAST BIOPSY    . BREAST LUMPECTOMY Right 1992  . BREAST SURGERY  1992, 2002   biopsy  . LAPAROSCOPY  1990  . MOUTH RANULA EXCISION  1996  . ORIF FOOT FRACTURE  2007   left 5th metatarsal    Family History  Problem Relation Age of Onset  . Hyperlipidemia Mother   . Diabetes Mother   . Dementia Mother   . Parkinson's disease Mother   . Arthritis Father   . Hyperlipidemia Father   . Cancer Maternal Aunt        breast  . Breast cancer Maternal Aunt   . Arthritis Maternal Grandmother   . Arthritis Maternal Grandfather   . Arthritis Paternal Grandmother   . Diabetes Paternal Grandmother   . Arthritis Paternal Grandfather     SOCIAL HX: See HPI   Current Outpatient Medications:  .  Cholecalciferol (VITAMIN D PO), Take 2,000 Units by mouth daily. , Disp: , Rfl:  .  denosumab (PROLIA) 60 MG/ML SOLN injection, Inject 60 mg into the skin every 6 (six) months. Administer  in upper arm, thigh, or abdomen, Disp: , Rfl:  .  diclofenac sodium (VOLTAREN) 1 % GEL, Apply 1 application topically 4 (four) times daily., Disp: 100 g, Rfl: 0 .  DULoxetine (CYMBALTA) 60 MG capsule, TAKE 1 CAPSULE BY MOUTH EVERY DAY, Disp: 90 capsule, Rfl: 1 .  fish oil-omega-3 fatty acids 1000 MG capsule, Take 2 g by mouth daily.  , Disp: , Rfl:  .  Multiple Vitamin (MULTIVITAMIN) tablet, Take 1 tablet by mouth daily.  , Disp: , Rfl:  .  naproxen sodium (ANAPROX) 220 MG tablet, Take 220 mg by mouth as needed. , Disp: , Rfl:  .  NON FORMULARY, Tumeric 500mg  twice a day,  Disp: , Rfl:  .  Probiotic Product (PROBIOTIC DAILY PO), Take 1 capsule by mouth daily., Disp: , Rfl:  .  vitamin C (ASCORBIC ACID) 500 MG tablet, Take 500 mg by mouth daily., Disp: , Rfl:   EXAM:  Vitals:   11/21/17 1017  BP: 124/88  Pulse: 74  Temp: 98.5 F (36.9 C)    Body mass index is 20.49 kg/m.  GENERAL: vitals reviewed and listed above, alert, oriented, appears well hydrated and in no acute distress  HEENT: atraumatic, conjunttiva clear, no obvious abnormalities on inspection of external nose and ears  NECK: no obvious masses on inspection  LUNGS: clear to auscultation bilaterally, no wheezes, rales or rhonchi, good air movement  CV: HRRR, no peripheral edema  MS: moves all extremities without noticeable abnormality  PSYCH: pleasant and cooperative, no obvious depression or anxiety  ASSESSMENT AND PLAN:  Discussed the following assessment and plan:  Need for immunization against influenza - Plan: Flu Vaccine QUAD 6+ mos PF IM (Fluarix Quad PF)  Need for shingles vaccine - Plan: Varicella-zoster vaccine IM (Shingrix)  Anxiety and depression  Osteoporosis, unspecified osteoporosis type, unspecified pathological fracture presence  Rheumatoid arthritis involving multiple sites, unspecified rheumatoid factor presence (HCC)  Sjogren's syndrome, with unspecified organ involvement (HCC)  Hyperlipidemia, unspecified hyperlipidemia type  -Reported on the difficult year and with the stuff going on with her husband, she prefers to continue with counseling through hospice -Discussed the cholesterol and options for treatment, she would like to try diet stuff and we talked a lot about the Mediterranean diet and benefits -Seeing specialist for other issues currently -Physical in 4 to 6 months, follow-up sooner as needed -Updated vaccines per orders -Patient advised to return or notify a doctor immediately if symptoms worsen or persist or new concerns arise.  Patient  Instructions  BEFORE YOU LEAVE: -flu shot and new shingles vaccine -follow up: 4-6 months for CPE  Continue counseling. Let us know if thing get worse or you need further help.  Consider the Mediterranean diet, regular aerobic exercise and recheck of cholesterol.   We recommend the following healthy lifestyle for LIFE: 1) Small portions. But, make sure to get regular (at least 3 per day), healthy meals and small healthy snacks if needed.  2) Eat a healthy clean diet.   TRY TO EAT: -at least 5-7 servings of low sugar, colorful, and nutrient rich vegetables per day (not corn, potatoes or bananas.) -berries are the best choice if you wish to eat fruit (only eat small amounts if trying to reduce weight)  -lean meets (fish, white meat of chicken or [place]) -vegan proteins for some meals - beans or tofu, whole grains, nuts and seeds -Replace bad fats with good fats - good fats include: fish, nuts and seeds, canola oil, olive oil -  small amounts of low fat or non fat dairy -small amounts of100 % whole grains - check the lables -drink plenty of water  AVOID: -SUGAR, sweets, anything with added sugar, corn syrup or sweeteners - must read labels as even foods advertised as "healthy" often are loaded with sugar -if you must have a sweetener, small amounts of stevia may be best -sweetened beverages and artificially sweetened beverages -simple starches (rice, bread, potatoes, pasta, chips, etc - small amounts of 100% whole grains are ok) -red meat, pork, butter -fried foods, fast food, processed food, excessive dairy, eggs and coconut.  3)Get at least 150 minutes of sweaty aerobic exercise per week.  4)Reduce stress - consider counseling, meditation and relaxation to balance other aspects of your life.     Terressa Koyanagi, DO

## 2017-11-21 ENCOUNTER — Encounter: Payer: Self-pay | Admitting: Family Medicine

## 2017-11-21 ENCOUNTER — Ambulatory Visit: Payer: Federal, State, Local not specified - PPO | Admitting: Family Medicine

## 2017-11-21 VITALS — BP 124/88 | HR 74 | Temp 98.5°F | Ht 61.75 in | Wt 111.1 lb

## 2017-11-21 DIAGNOSIS — M3500 Sicca syndrome, unspecified: Secondary | ICD-10-CM

## 2017-11-21 DIAGNOSIS — Z23 Encounter for immunization: Secondary | ICD-10-CM | POA: Diagnosis not present

## 2017-11-21 DIAGNOSIS — M810 Age-related osteoporosis without current pathological fracture: Secondary | ICD-10-CM

## 2017-11-21 DIAGNOSIS — E785 Hyperlipidemia, unspecified: Secondary | ICD-10-CM

## 2017-11-21 DIAGNOSIS — M069 Rheumatoid arthritis, unspecified: Secondary | ICD-10-CM

## 2017-11-21 DIAGNOSIS — F329 Major depressive disorder, single episode, unspecified: Secondary | ICD-10-CM

## 2017-11-21 DIAGNOSIS — F32A Depression, unspecified: Secondary | ICD-10-CM

## 2017-11-21 DIAGNOSIS — F419 Anxiety disorder, unspecified: Secondary | ICD-10-CM | POA: Diagnosis not present

## 2017-11-21 NOTE — Patient Instructions (Addendum)
BEFORE YOU LEAVE: -flu shot and new shingles vaccine -follow up: 4-6 months for CPE  Continue counseling. Let us know if thing get worse or you need further help.  Consider the Mediterranean diet, regular aerobic exercise and recheck of cholesterol.   We recommend the following healthy lifestyle for LIFE: 1) Small portions. But, make sure to get regular (at least 3 per day), healthy meals and small healthy snacks if needed.  2) Eat a healthy clean diet.   TRY TO EAT: -at least 5-7 servings of low sugar, colorful, and nutrient rich vegetables per day (not corn, potatoes or bananas.) -berries are the best choice if you wish to eat fruit (only eat small amounts if trying to reduce weight)  -lean meets (fish, white meat of chicken or [place]) -vegan proteins for some meals - beans or tofu, whole grains, nuts and seeds -Replace bad fats with good fats - good fats include: fish, nuts and seeds, canola oil, olive oil -small amounts of low fat or non fat dairy -small amounts of100 % whole grains - check the lables -drink plenty of water  AVOID: -SUGAR, sweets, anything with added sugar, corn syrup or sweeteners - must read labels as even foods advertised as "healthy" often are loaded with sugar -if you must have a sweetener, small amounts of stevia may be best -sweetened beverages and artificially sweetened beverages -simple starches (rice, bread, potatoes, pasta, chips, etc - small amounts of 100% whole grains are ok) -red meat, pork, butter -fried foods, fast food, processed food, excessive dairy, eggs and coconut.  3)Get at least 150 minutes of sweaty aerobic exercise per week.  4)Reduce stress - consider counseling, meditation and relaxation to balance other aspects of your life.

## 2017-11-28 ENCOUNTER — Encounter: Payer: Self-pay | Admitting: Family Medicine

## 2017-11-28 NOTE — Telephone Encounter (Signed)
Spoke with pt and advised we cannot send out records without a signed release. She does have MyChart and can print out documentation of her flu vaccine from there and send wherever she needs. Pt was not aware she could do this. Nothing further needed.

## 2017-11-30 DIAGNOSIS — K080 Exfoliation of teeth due to systemic causes: Secondary | ICD-10-CM | POA: Diagnosis not present

## 2017-12-09 DIAGNOSIS — F4323 Adjustment disorder with mixed anxiety and depressed mood: Secondary | ICD-10-CM | POA: Diagnosis not present

## 2017-12-12 DIAGNOSIS — M359 Systemic involvement of connective tissue, unspecified: Secondary | ICD-10-CM | POA: Diagnosis not present

## 2017-12-12 DIAGNOSIS — M3500 Sicca syndrome, unspecified: Secondary | ICD-10-CM | POA: Diagnosis not present

## 2017-12-12 DIAGNOSIS — M79641 Pain in right hand: Secondary | ICD-10-CM | POA: Diagnosis not present

## 2017-12-12 DIAGNOSIS — M0579 Rheumatoid arthritis with rheumatoid factor of multiple sites without organ or systems involvement: Secondary | ICD-10-CM | POA: Diagnosis not present

## 2018-01-09 DIAGNOSIS — K080 Exfoliation of teeth due to systemic causes: Secondary | ICD-10-CM | POA: Diagnosis not present

## 2018-01-11 DIAGNOSIS — F4323 Adjustment disorder with mixed anxiety and depressed mood: Secondary | ICD-10-CM | POA: Diagnosis not present

## 2018-03-01 DIAGNOSIS — F4323 Adjustment disorder with mixed anxiety and depressed mood: Secondary | ICD-10-CM | POA: Diagnosis not present

## 2018-03-18 DIAGNOSIS — F4323 Adjustment disorder with mixed anxiety and depressed mood: Secondary | ICD-10-CM | POA: Diagnosis not present

## 2018-03-20 ENCOUNTER — Other Ambulatory Visit: Payer: Self-pay | Admitting: Family Medicine

## 2018-03-20 DIAGNOSIS — M3500 Sicca syndrome, unspecified: Secondary | ICD-10-CM | POA: Diagnosis not present

## 2018-03-20 DIAGNOSIS — M0579 Rheumatoid arthritis with rheumatoid factor of multiple sites without organ or systems involvement: Secondary | ICD-10-CM | POA: Diagnosis not present

## 2018-03-20 DIAGNOSIS — M359 Systemic involvement of connective tissue, unspecified: Secondary | ICD-10-CM | POA: Diagnosis not present

## 2018-03-20 DIAGNOSIS — M810 Age-related osteoporosis without current pathological fracture: Secondary | ICD-10-CM | POA: Diagnosis not present

## 2018-03-20 MED ORDER — DULOXETINE HCL 60 MG PO CPEP: ORAL_CAPSULE | ORAL | 0 refills | Status: DC

## 2018-03-20 NOTE — Telephone Encounter (Signed)
Copied from CRM [id number]. Topic: Quick Communication - Rx Refill/Question >> Mar 20, 2018  9:40 AM Herby Abraham C wrote: Medication: DULoxetine (CYMBALTA) 60 MG capsule   Has the patient contacted their pharmacy? No  (Agent: If no, request that the patient contact the pharmacy for the refill.) (Agent: If yes, when and what did the pharmacy advise?)  Preferred Pharmacy (with phone number or street name): [id number] [place],  - [address]  Agent: Please be advised that RX refills may take up to 3 business days. We ask that you follow-up with your pharmacy.

## 2018-03-20 NOTE — Telephone Encounter (Signed)
Appointment scheduled 05/29/18 Requested Prescriptions  Pending Prescriptions Disp Refills  . DULoxetine (CYMBALTA) 60 MG capsule 90 capsule 0    Sig: TAKE 1 CAPSULE BY MOUTH EVERY DAY     Psychiatry: Antidepressants - SNRI Passed - 03/20/2018  9:47 AM      Passed - Completed PHQ-2 or PHQ-9 in the last 360 days.      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[employer], [place], DO   10 months ago Recurrent major depressive disorder, in partial remission (HCC)   [facility] conservation officer at [employer], Damita Lack, DO   1 year ago Encounter for preventative adult health care examination   [facility] conservation officer at [employer], Damita Lack, DO   1 year ago Respiratory illness   [facility] conservation officer at [employer], [employer], DO   1 year ago Influenza-like illness   [facility] conservation officer at [employer], Neta Mends, MD      Future Appointments            In 2 months Selena Batten, Damita Lack, DO [employer] [facility] [place], [facility]

## 2018-04-12 DIAGNOSIS — F4323 Adjustment disorder with mixed anxiety and depressed mood: Secondary | ICD-10-CM | POA: Diagnosis not present

## 2018-05-29 ENCOUNTER — Other Ambulatory Visit: Payer: Self-pay

## 2018-05-29 ENCOUNTER — Encounter: Payer: Self-pay | Admitting: Family Medicine

## 2018-05-29 ENCOUNTER — Ambulatory Visit (INDEPENDENT_AMBULATORY_CARE_PROVIDER_SITE_OTHER): Payer: Federal, State, Local not specified - PPO | Admitting: Family Medicine

## 2018-05-29 DIAGNOSIS — M810 Age-related osteoporosis without current pathological fracture: Secondary | ICD-10-CM | POA: Diagnosis not present

## 2018-05-29 DIAGNOSIS — M069 Rheumatoid arthritis, unspecified: Secondary | ICD-10-CM

## 2018-05-29 DIAGNOSIS — Z0000 Encounter for general adult medical examination without abnormal findings: Secondary | ICD-10-CM | POA: Diagnosis not present

## 2018-05-29 DIAGNOSIS — F32A Depression, unspecified: Secondary | ICD-10-CM

## 2018-05-29 DIAGNOSIS — F419 Anxiety disorder, unspecified: Secondary | ICD-10-CM

## 2018-05-29 DIAGNOSIS — F329 Major depressive disorder, single episode, unspecified: Secondary | ICD-10-CM

## 2018-05-29 NOTE — Progress Notes (Signed)
Virtual Visit via Video Note  I connected with Andrea Fields  on 05/29/18 at  9:00 AM EDT by a video enabled telemedicine application and verified that I am speaking with the correct person.  Location patient: home Location provider:work or home office Persons participating in the virtual visit: patient, provider  Here for CPE:  -Concerns and/or follow up today:   Chronic medical problems summarized below were reviewed for changes. 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 Mostly retired. But plans to continue working for a few months. Thinks will retire.  Unfortunately, her husband did pass earlier this year. She is recovering from grief. Has counseling on a regular basis. Feels like she is doing better. She continues to wish to avoid medications for lipids and prefers not to check in light of the COVID19 pandemic as wishes to avoid an in person OV visit for now. Doing lots of walking/hiking. She plans to see gyn for a pap smear.   Hyperlipidemia: -advised lifestyle changes and consideration medication, she was not sure about a medication  RA/Sjogren's/Osteoporosis/?fibromyalgia: -sees Dr. 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 Benjie Karvonen for gyn exams - considering setting up visit with different provider -sexual activity: not discussed, sees gyn -wants STI testing (Hep C if born [birth year]): no -FH breast, colon or  ovarian ca: see FH Last mammogram: sees Dr. Benjie Karvonen for women's health exams, had mammogram 11/09/17 Last colon cancer screening: did cologuard 12/2015 Breast Ca Risk Assessment: see family history and pt history DEXA (>/= 14): sees rheumatology (Dr. Dossie Der) for osteoporosis, last DEXA was 11/2017  -Alcohol, Tobacco, drug use: see social history  Review of Systems - no fevers, unintentional weight loss, vision loss, hearing loss, chest pain, sob, hemoptysis, melena, hematochezia, hematuria, genital discharge, changing or concerning skin lesions, bleeding, bruising, loc, thoughts of self harm or SI  Past Medical History:  Diagnosis Date  . Anemia    pt does not recall having this, CBC 05/2015 normal  . Anxiety and depression 07/03/2015   -sees psychiatry, Metta Clines   . Chronic low back pain 05/13/2014  . DCIS (ductal carcinoma in situ)    1992 dx during pregnancy, s/p lumpectomy remotely and no further treatment; benign biopsy in 2002  . Left ankle sprain    w/ lat mal avulsion fx in 2017; saw Dr. Doran Durand  . Osteoporosis 07/03/2015   -seeing Dr. Dossie Der -did not tolerate boniva   . Rheumatoid arthritis(714.0)    Dx in 2017, seeing Dr. Dossie Der, Rheumatologist  . Sjogren's syndrome [facility])    Sees Rheumatologist    Past Surgical History:  Procedure Laterality Date  . ABCESS DRAINAGE  2012   sinus  . BREAST BIOPSY    . BREAST LUMPECTOMY Right 1992  . BREAST SURGERY  1992, 2002   biopsy  . LAPAROSCOPY  1990  . MOUTH RANULA EXCISION  1996  . ORIF FOOT FRACTURE  2007   left 5th metatarsal    Family History  Problem Relation Age  of Onset  . Hyperlipidemia Mother   . Diabetes Mother   . Dementia Mother   . Parkinson's disease Mother   . Arthritis Father   . Hyperlipidemia Father   . Heart block Father   . Cancer Maternal Aunt        breast  . Breast cancer Maternal Aunt   . Arthritis Maternal Grandmother   . Arthritis Maternal Grandfather   . Arthritis Paternal Grandmother   .  Diabetes Paternal Grandmother   . Arthritis Paternal Grandfather     Social History   Socioeconomic History  . Marital status: Married    Spouse name: Not on file  . Number of children: 2  . Years of education: Not on file  . Highest education level: Not on file  Occupational History  . Occupation: Programmer, multimedia: [employer]  . Financial resource strain: Not on file  . Food insecurity:    Worry: Not on file    Inability: Not on file  . Transportation needs:    Medical: Not on file    Non-medical: Not on file  Tobacco Use  . Smoking status: Current Some Day Smoker    Types: Cigarettes  . Smokeless tobacco: Never Used  . Tobacco comment: 2 cigarette per day  Substance and Sexual Activity  . Alcohol use: Yes  . Drug use: No  . Sexual activity: Not on file  Lifestyle  . Physical activity:    Days per week: Not on file    Minutes per session: Not on file  . Stress: Not on file  Relationships  . Social connections:    Talks on phone: Not on file    Gets together: Not on file    Attends religious service: Not on file    Active member of club or organization: Not on file    Attends meetings of clubs or organizations: Not on file    Relationship status: Not on file  Other Topics Concern  . Not on file  Social History Narrative   Work or School: Therapist, sports, 3 days per week      Home Situation: lives with husband - he is sick with prostate ca; adopted son and daughter is Oncologist      Spiritual Beliefs: Catholic - very active in the faith      Lifestyle: walking on a regular basis; diet is healthy      Drinks very little red wine, uses caution.        Current Outpatient Medications:  .  Cholecalciferol (VITAMIN D PO), Take 2,000 Units by mouth daily. , Disp: , Rfl:  .  denosumab (PROLIA) 60 MG/ML SOLN injection, Inject 60 mg into the skin every 6 (six) months. Administer in upper arm, thigh, or abdomen, Disp: , Rfl:  .  diclofenac sodium (VOLTAREN) 1 %  GEL, Apply 1 application topically 4 (four) times daily., Disp: 100 g, Rfl: 0 .  DULoxetine (CYMBALTA) 60 MG capsule, TAKE 1 CAPSULE BY MOUTH EVERY DAY, Disp: 90 capsule, Rfl: 0 .  fish oil-omega-3 fatty acids 1000 MG capsule, Take 2 g by mouth daily.  , Disp: , Rfl:  .  Multiple Vitamin (MULTIVITAMIN) tablet, Take 1 tablet by mouth daily.  , Disp: , Rfl:  .  naproxen sodium (ANAPROX) 220 MG tablet, Take 220 mg by mouth as needed. , Disp: , Rfl:  .  NON FORMULARY, Tumeric 586m twice a day, Disp: , Rfl:  .  Probiotic Product (  PROBIOTIC DAILY PO), Take 1 capsule by mouth daily., Disp: , Rfl:  .  vitamin C (ASCORBIC ACID) 500 MG tablet, Take 500 mg by mouth daily., Disp: , Rfl:   EXAM:  There were no vitals filed for this visit.  VITALS per patient if applicable: none today per pt  GENERAL: alert, oriented, appears well and in no acute distress  HEENT: atraumatic, conjunttiva clear, no obvious abnormalities on inspection of external nose and ears  NECK: normal movements of the head and neck  LUNGS: on inspection no signs of respiratory distress, breathing rate appears normal, no obvious gross SOB, gasping or wheezing  GYN/BREAST: deferred, sees gyn, virtual visit  SKIN: visualized portions without gross abnormality  CV: no obvious cyanosis  MS: moves all visible extremities without noticeable abnormality  PSYCH/NEURO: pleasant and cooperative, no obvious depression or anxiety, speech and thought processing grossly intact   ASSESSMENT AND PLAN:  Discussed the following assessment and plan:  PREVENTIVE EXAM: -Discussed and advised all [place] preventive services health task force level A and B recommendations for age, sex and risks. -Advised at least 150 minutes of exercise per week and a healthy diet  -advised of pap due in Epic, she feels may have been done 3 years ago, will ask my assistant to contact gyn and update -she prefers to not come into the office right now for labs in  light of the COVID 19 pandemic. She continues to prefer to avoid medications for her cholesterol. Advise healthy diet and regular aerobic exercise. -labs, studies and vaccines per orders this encounter  2. Anxiety and depression -doing ok despite recent loss-cont counseling -medication refilled  3. Osteoporosis, unspecified osteoporosis type, unspecified pathological fracture presence -sees specialist  4. Rheumatoid arthritis involving multiple sites [facility]) -sees specialist, no meds currently  Advised to follow up in 3 months for TOC and as needed.   Follow up instructions: Advised assistant Wendie Simmer to help patient arrange the following: -TOC with Dr. Ethlyn Gallery in 3 months -vitals and PHQ9 in epic -call gyn and update pap  Patient Instructions   -follow up: set up gyn visit, set up visit with Dr. Ethlyn Gallery if you decide to stay with our office   Preventive Care 40-64 Years, Female Preventive care refers to lifestyle choices and visits with your health care provider that can promote health and wellness. What does preventive care include?   A yearly physical exam. This is also called an annual well check.  Dental exams once or twice a year.  Routine eye exams. Ask your health care provider how often you should have your eyes checked.  Personal lifestyle choices, including: ? Daily care of your teeth and gums. ? Regular physical activity. ? Eating a healthy diet. ? Avoiding tobacco and drug use. ? Limiting alcohol use. ? Practicing safe sex. ? Taking low-dose aspirin daily starting at age 74. ? Taking vitamin and mineral supplements as recommended by your health care provider. What happens during an annual well check? The services and screenings done by your health care provider during your annual well check will depend on your age, overall health, lifestyle risk factors, and family history of disease. Counseling Your health care provider may ask you questions about  your:  Alcohol use.  Tobacco use.  Drug use.  Emotional well-being.  Home and relationship well-being.  Sexual activity.  Eating habits.  Work and work Statistician.  Method of birth control.  Menstrual cycle.  Pregnancy history. Screening You may have the following  tests or measurements:  Height, weight, and BMI.  Blood pressure.  Lipid and cholesterol levels. These may be checked every 5 years, or more frequently if you are over 25 years old.  Skin check.  Lung cancer screening. You may have this screening every year starting at age 13 if you have a 30-pack-year history of smoking and currently smoke or have quit within the past 15 years.  Colorectal cancer screening. All adults should have this screening starting at age 71 and continuing until age 74. Your health care provider may recommend screening at age 13. You will have tests every 1-10 years, depending on your results and the type of screening test. People at increased risk should start screening at an earlier age. Screening tests may include: ? Guaiac-based fecal occult blood testing. ? Fecal immunochemical test (FIT). ? Stool DNA test. ? Virtual colonoscopy. ? Sigmoidoscopy. During this test, a flexible tube with a tiny camera (sigmoidoscope) is used to examine your rectum and lower colon. The sigmoidoscope is inserted through your anus into your rectum and lower colon. ? Colonoscopy. During this test, a long, thin, flexible tube with a tiny camera (colonoscope) is used to examine your entire colon and rectum.  Hepatitis C blood test.  Hepatitis B blood test.  Sexually transmitted disease (STD) testing.  Diabetes screening. This is done by checking your blood sugar (glucose) after you have not eaten for a while (fasting). You may have this done every 1-3 years.  Mammogram. This may be done every 1-2 years. Talk to your health care provider about when you should start having regular mammograms. This may  depend on whether you have a family history of breast cancer.  BRCA-related cancer screening. This may be done if you have a family history of breast, ovarian, tubal, or peritoneal cancers.  Pelvic exam and Pap test. This may be done every 3 years starting at age 41. Starting at age 77, this may be done every 5 years if you have a Pap test in combination with an HPV test.  Bone density scan. This is done to screen for osteoporosis. You may have this scan if you are at high risk for osteoporosis. Discuss your test results, treatment options, and if necessary, the need for more tests with your health care provider. Vaccines Your health care provider may recommend certain vaccines, such as:  Influenza vaccine. This is recommended every year.  Tetanus, diphtheria, and acellular pertussis (Tdap, Td) vaccine. You may need a Td booster every 10 years.  Varicella vaccine. You may need this if you have not been vaccinated.  Zoster vaccine. You may need this after age 43.  Measles, mumps, and rubella (MMR) vaccine. You may need at least one dose of MMR if you were born in 1957 or later. You may also need a second dose.  Pneumococcal 13-valent conjugate (PCV13) vaccine. You may need this if you have certain conditions and were not previously vaccinated.  Pneumococcal polysaccharide (PPSV23) vaccine. You may need one or two doses if you smoke cigarettes or if you have certain conditions.  Meningococcal vaccine. You may need this if you have certain conditions.  Hepatitis A vaccine. You may need this if you have certain conditions or if you travel or work in places where you may be exposed to hepatitis A.  Hepatitis B vaccine. You may need this if you have certain conditions or if you travel or work in places where you may be exposed to hepatitis B.  Haemophilus influenzae  type b (Hib) vaccine. You may need this if you have certain conditions. Talk to your health care provider about which  screenings and vaccines you need and how often you need them. This information is not intended to replace advice given to you by your health care provider. Make sure you discuss any questions you have with your health care provider. Document Released: 02/14/2015 Document Revised: 03/10/2017 Document Reviewed: 11/19/2014 Elsevier Interactive Patient Education  2019 [employer].    No follow-ups on file.  Lucretia Kern, DO

## 2018-05-29 NOTE — Patient Instructions (Signed)
-follow up: set up gyn visit, set up visit with Dr. Ethlyn Gallery if you decide to stay with our office   Preventive Care 40-64 Years, Female Preventive care refers to lifestyle choices and visits with your health care provider that can promote health and wellness. What does preventive care include?   A yearly physical exam. This is also called an annual well check.  Dental exams once or twice a year.  Routine eye exams. Ask your health care provider how often you should have your eyes checked.  Personal lifestyle choices, including: ? Daily care of your teeth and gums. ? Regular physical activity. ? Eating a healthy diet. ? Avoiding tobacco and drug use. ? Limiting alcohol use. ? Practicing safe sex. ? Taking low-dose aspirin daily starting at age 37. ? Taking vitamin and mineral supplements as recommended by your health care provider. What happens during an annual well check? The services and screenings done by your health care provider during your annual well check will depend on your age, overall health, lifestyle risk factors, and family history of disease. Counseling Your health care provider may ask you questions about your:  Alcohol use.  Tobacco use.  Drug use.  Emotional well-being.  Home and relationship well-being.  Sexual activity.  Eating habits.  Work and work Statistician.  Method of birth control.  Menstrual cycle.  Pregnancy history. Screening You may have the following tests or measurements:  Height, weight, and BMI.  Blood pressure.  Lipid and cholesterol levels. These may be checked every 5 years, or more frequently if you are over 49 years old.  Skin check.  Lung cancer screening. You may have this screening every year starting at age 47 if you have a 30-pack-year history of smoking and currently smoke or have quit within the past 15 years.  Colorectal cancer screening. All adults should have this screening starting at age 51 and  continuing until age 62. Your health care provider may recommend screening at age 55. You will have tests every 1-10 years, depending on your results and the type of screening test. People at increased risk should start screening at an earlier age. Screening tests may include: ? Guaiac-based fecal occult blood testing. ? Fecal immunochemical test (FIT). ? Stool DNA test. ? Virtual colonoscopy. ? Sigmoidoscopy. During this test, a flexible tube with a tiny camera (sigmoidoscope) is used to examine your rectum and lower colon. The sigmoidoscope is inserted through your anus into your rectum and lower colon. ? Colonoscopy. During this test, a long, thin, flexible tube with a tiny camera (colonoscope) is used to examine your entire colon and rectum.  Hepatitis C blood test.  Hepatitis B blood test.  Sexually transmitted disease (STD) testing.  Diabetes screening. This is done by checking your blood sugar (glucose) after you have not eaten for a while (fasting). You may have this done every 1-3 years.  Mammogram. This may be done every 1-2 years. Talk to your health care provider about when you should start having regular mammograms. This may depend on whether you have a family history of breast cancer.  BRCA-related cancer screening. This may be done if you have a family history of breast, ovarian, tubal, or peritoneal cancers.  Pelvic exam and Pap test. This may be done every 3 years starting at age 33. Starting at age 31, this may be done every 5 years if you have a Pap test in combination with an HPV test.  Bone density scan. This is done to  screen for osteoporosis. You may have this scan if you are at high risk for osteoporosis. Discuss your test results, treatment options, and if necessary, the need for more tests with your health care provider. Vaccines Your health care provider may recommend certain vaccines, such as:  Influenza vaccine. This is recommended every year.  Tetanus,  diphtheria, and acellular pertussis (Tdap, Td) vaccine. You may need a Td booster every 10 years.  Varicella vaccine. You may need this if you have not been vaccinated.  Zoster vaccine. You may need this after age 77.  Measles, mumps, and rubella (MMR) vaccine. You may need at least one dose of MMR if you were born in 1957 or later. You may also need a second dose.  Pneumococcal 13-valent conjugate (PCV13) vaccine. You may need this if you have certain conditions and were not previously vaccinated.  Pneumococcal polysaccharide (PPSV23) vaccine. You may need one or two doses if you smoke cigarettes or if you have certain conditions.  Meningococcal vaccine. You may need this if you have certain conditions.  Hepatitis A vaccine. You may need this if you have certain conditions or if you travel or work in places where you may be exposed to hepatitis A.  Hepatitis B vaccine. You may need this if you have certain conditions or if you travel or work in places where you may be exposed to hepatitis B.  Haemophilus influenzae type b (Hib) vaccine. You may need this if you have certain conditions. Talk to your health care provider about which screenings and vaccines you need and how often you need them. This information is not intended to replace advice given to you by your health care provider. Make sure you discuss any questions you have with your health care provider. Document Released: 02/14/2015 Document Revised: 03/10/2017 Document Reviewed: 11/19/2014 Elsevier Interactive Patient Education  2019 [employer].

## 2018-06-14 ENCOUNTER — Other Ambulatory Visit: Payer: Self-pay | Admitting: Family Medicine

## 2018-09-11 ENCOUNTER — Encounter: Payer: Federal, State, Local not specified - PPO | Admitting: Family Medicine

## 2018-09-18 ENCOUNTER — Other Ambulatory Visit: Payer: Self-pay

## 2018-09-18 ENCOUNTER — Encounter: Payer: Self-pay | Admitting: Family Medicine

## 2018-09-18 ENCOUNTER — Ambulatory Visit (INDEPENDENT_AMBULATORY_CARE_PROVIDER_SITE_OTHER): Payer: Federal, State, Local not specified - PPO | Admitting: Family Medicine

## 2018-09-18 DIAGNOSIS — F419 Anxiety disorder, unspecified: Secondary | ICD-10-CM | POA: Diagnosis not present

## 2018-09-18 DIAGNOSIS — F4321 Adjustment disorder with depressed mood: Secondary | ICD-10-CM | POA: Diagnosis not present

## 2018-09-18 DIAGNOSIS — M069 Rheumatoid arthritis, unspecified: Secondary | ICD-10-CM | POA: Diagnosis not present

## 2018-09-18 DIAGNOSIS — R55 Syncope and collapse: Secondary | ICD-10-CM

## 2018-09-18 DIAGNOSIS — M359 Systemic involvement of connective tissue, unspecified: Secondary | ICD-10-CM | POA: Diagnosis not present

## 2018-09-18 DIAGNOSIS — R42 Dizziness and giddiness: Secondary | ICD-10-CM

## 2018-09-18 DIAGNOSIS — M0579 Rheumatoid arthritis with rheumatoid factor of multiple sites without organ or systems involvement: Secondary | ICD-10-CM | POA: Diagnosis not present

## 2018-09-18 DIAGNOSIS — M3500 Sicca syndrome, unspecified: Secondary | ICD-10-CM | POA: Diagnosis not present

## 2018-09-18 DIAGNOSIS — F17209 Nicotine dependence, unspecified, with unspecified nicotine-induced disorders: Secondary | ICD-10-CM

## 2018-09-18 DIAGNOSIS — F329 Major depressive disorder, single episode, unspecified: Secondary | ICD-10-CM

## 2018-09-18 DIAGNOSIS — F32A Depression, unspecified: Secondary | ICD-10-CM

## 2018-09-18 DIAGNOSIS — M810 Age-related osteoporosis without current pathological fracture: Secondary | ICD-10-CM

## 2018-09-18 DIAGNOSIS — Z716 Tobacco abuse counseling: Secondary | ICD-10-CM

## 2018-09-18 DIAGNOSIS — E785 Hyperlipidemia, unspecified: Secondary | ICD-10-CM

## 2018-09-18 NOTE — Progress Notes (Signed)
I have discussed the procedure for the virtual visit with the patient who has given consent to proceed with assessment and treatment.   Jessica L Brodmerkel, CMA     

## 2018-09-18 NOTE — Progress Notes (Signed)
Virtual Visit via Video Note  I connected with Andrea Fields   on 09/18/18 at 11:00 AM EDT by a video enabled telemedicine application and verified that I am speaking with the correct person using two identifiers.  Location patient: home Location provider:work office Persons participating in the virtual visit: patient, provider  I discussed the limitations of evaluation and management by telemedicine and the availability of in person appointments. The patient expressed understanding and agreed to proceed.   Andrea Fields DOB: 06-Jun-1955 Encounter date: 09/18/2018  This is a 63 y.o. female who presents to establish care. Chief Complaint  Patient presents with  . Establish Care    Transfer of Care   Last visit with [facility] virtual in 05/2018; last bloodwork was lipis in 06/2017  History of present illness: Rheumatoid arthritis/Sjogrens: follows with Dr. Kathi Ludwig. Hasn't been on medications for awhile. Didn't feel like there was benefit with Humira.  Has some aches/pains. Feels like she is noticing these more often. Wonders about hips. Getting pain into groin - more on right. Knows she puts more pressure on right leg when standing at work. Feet and hands bother her somewhat. Has visit with rheumatology this month.  Osteoporosis:on prolia.  Anxiety and depression: stable on cymbalta. Husband passed in December 2019 after 32 yr marriage;  previously in hospice with prostate CA. She does talk regularly with therapist linked through hospice. Grief group had to close off due to COVID. She is going to ask about virtual meetings today. Has been able to visit children during pandemic.   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 She is cautious and is only working 2 days a week with surgery prep.   Daughter tested last week and was negative.   Has had dizziness for many years. Not sure she mentioned it much to doctors in the past. Remembers when it started it was more obvious to her  because it was new. Now she just knows she has imbalance, dizzy feeling. Usually earlier in day. Has had 2 falls - both she felt like she was passing out - diaphoretic, dizzy feeling, pale and both in the morning. One of times did have BM. In past when dizzy would just bump into something and that was it. But last 2 times she has fallen. Last time hit head on furniture. Not losing consciousness. Doesn't feel like there is anything going on in chest. In past has had flutters, but not noting this. Feels like it starts more in head. Also feels like she gets neck stiffness when sx occur. Touches neck with cold hand and then feels better. Just 2 episodes in last year. Does still have episodes with dizzy feelings. One time did pass out - fell back onto carpet and hit hand on railing. Both spells occurred when she was under more stress with caring for sick husband. When she first had dizziness it was about 8-10 years ago and was with NP in [place]. Just had in office evaluation with neuro exam. Last episode was about 9 months ago.    Eyes are so dry in morning with Sjogrens that she just jumps out of bed to get drops.   She is on verge of retiring and feels she can't imagine not doing this job after 45 years.  Follows with gyn for routine care   Past Medical History:  Diagnosis Date  . Anemia    pt does not recall having this, CBC 05/2015 normal  .  Anxiety and depression 07/03/2015   -sees psychiatry, Vear ClockLeslie O'Neil   . Chronic low back pain 05/13/2014  . DCIS (ductal carcinoma in situ)    1992 dx during pregnancy, s/p lumpectomy remotely and no further treatment; benign biopsy in 2002  . Left ankle sprain    w/ lat mal avulsion fx in 2017; saw Dr. Victorino DikeHewitt  . Osteoporosis 07/03/2015   -seeing Dr. Kathi LudwigSyed -did not tolerate boniva   . Rheumatoid arthritis(714.0)    Dx in 2017, seeing Dr. Kathi LudwigSyed, Rheumatologist  . Sjogren's syndrome [facility](HCC)    Sees Rheumatologist   Past Surgical History:  Procedure Laterality Date   . ABCESS DRAINAGE  2012   sinus  . BREAST BIOPSY    . BREAST LUMPECTOMY Right 1992  . BREAST SURGERY  1992, 2002   biopsy  . LAPAROSCOPY  1990  . MOUTH RANULA EXCISION  1996  . ORIF FOOT FRACTURE  2007   left 5th metatarsal   Allergies  Allergen Reactions  . Azithromycin     Joint pain  . Boniva [Ibandronic Acid]   . Vectrin [Minocycline Hcl]    Current Meds  Medication Sig  . Cholecalciferol (VITAMIN D PO) Take 2,000 Units by mouth daily.   Marland Kitchen. denosumab (PROLIA) 60 MG/ML SOLN injection Inject 60 mg into the skin every 6 (six) months. Administer in upper arm, thigh, or abdomen  . diclofenac sodium (VOLTAREN) 1 % GEL Apply 1 application topically 4 (four) times daily.  . DULoxetine (CYMBALTA) 60 MG capsule TAKE 1 CAPSULE BY MOUTH EVERY DAY  . fish oil-omega-3 fatty acids 1000 MG capsule Take 2 g by mouth daily.    . Multiple Vitamin (MULTIVITAMIN) tablet Take 1 tablet by mouth daily.    . naproxen sodium (ANAPROX) 220 MG tablet Take 220 mg by mouth as needed.   . Probiotic Product (PROBIOTIC DAILY PO) Take 1 capsule by mouth daily.  . vitamin C (ASCORBIC ACID) 500 MG tablet Take 500 mg by mouth daily.  . [DISCONTINUED] NON FORMULARY Tumeric 500mg  twice a day   Social History   Tobacco Use  . Smoking status: Current Some Day Smoker    Types: Cigarettes  . Smokeless tobacco: Never Used  . Tobacco comment: 3 cigarette per day  Substance Use Topics  . Alcohol use: Yes   Family History  Problem Relation Age of Onset  . Hyperlipidemia Mother   . Diabetes Mother   . Dementia Mother   . Parkinson's disease Mother   . Hyperlipidemia Father   . Heart block Father   . Heart disease Father   . Osteoarthritis Father   . Other Father        optic ischemia  . Cancer Maternal Aunt        breast  . Arthritis Maternal Grandmother   . Arthritis Maternal Grandfather   . Arthritis Paternal Grandmother   . Diabetes Paternal Grandmother   . Arthritis Paternal Grandfather   .  Asthma Sister   . Sleep apnea Brother   . Arthritis Sister   . Other Sister   . Healthy Brother      Review of Systems  Constitutional: Negative for chills, fatigue and fever.  HENT: Positive for postnasal drip (in July). Negative for congestion.   Respiratory: Negative for cough, chest tightness, shortness of breath and wheezing.   Cardiovascular: Negative for chest pain, palpitations and leg swelling.  Musculoskeletal: Positive for neck pain and neck stiffness.  Neurological: Positive for dizziness, light-headedness and headaches (over weekend  had discomfort back left of head).    Objective:  There were no vitals taken for this visit.      BP Readings from Last 3 Encounters:  11/21/17 124/88  05/24/17 110/78  11/16/16 (!) 88/60   Wt Readings from Last 3 Encounters:  11/21/17 111 lb 1.6 oz (50.4 kg)  05/24/17 115 lb 8 oz (52.4 kg)  11/16/16 119 lb 3.2 oz (54.1 kg)    EXAM:  GENERAL: alert, oriented, appears well and in no acute distress  HEENT: atraumatic, conjunctiva clear, no obvious abnormalities on inspection of external nose and ears  NECK: normal movements of the head and neck  LUNGS: on inspection no signs of respiratory distress, breathing rate appears normal, no obvious gross SOB, gasping or wheezing  CV: no obvious cyanosis  MS: moves all visible extremities without noticeable abnormality  PSYCH/NEURO: pleasant and cooperative, no obvious depression or anxiety, speech and thought processing grossly intact  SKIN: no facial or neck abnormalities noted.  Assessment/Plan  1. Rheumatoid arthritis involving multiple sites, unspecified rheumatoid factor presence ([place]) She is following with rheumatology and has appointment later today. I encouraged her to discuss long term pros/cons of medication treatment. She has done well without medications in recent years, but I did advise that consideration for inflammation in body and other consequences from this (ie  cardiovascular) should be considered.  2. Osteoporosis, unspecified osteoporosis type, unspecified pathological fracture presence On prolia. - VITAMIN D 25 Hydroxy (Vit-D Deficiency, Fractures); Future  3. Anxiety and depression She has moments that are bad. She is speaking with counselor regularly. She is going to try to get back into grief group. She will reach out if she feels mood is worsening. We discussed consideration for wellbutrin for smoking cessation today which would also be nice mood adjunct.  4. Grieving See above.  5. Near syncope We will consider further evaluation pending bloodwork. She has not had episode in 9 months, so I do not feel urgent eval is needed. Make sure taking time with position changes, staying well hydrated, eating regular meals.  6. Dizziness See above. - TSH; Future  7. Hyperlipidemia, unspecified hyperlipidemia type - Lipid panel; Future - TSH; Future  8. Tobacco abuse/counseling: we discussed this in detail. She has smoked for years, very minimally. She doesn't feel ready to cut out her few cigarettes daily, but has some interest in quitting. 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 We specifically discussed wellbutrin. She will consider and we will address at next visit. About 5 minutes spent in discussion.   I discussed the assessment and treatment plan with the patient. The patient was provided an opportunity to ask questions and all were answered. The patient agreed with the plan and demonstrated an understanding of the instructions.   The patient was advised to call back or seek an in-person evaluation if the symptoms worsen or if the condition fails to improve as anticipated.  I provided 35 minutes of non-face-to-face time during this encounter.   Micheline Rough, MD

## 2018-09-25 ENCOUNTER — Telehealth: Payer: Self-pay | Admitting: *Deleted

## 2018-09-25 NOTE — Telephone Encounter (Signed)
Lab appt scheduled for 8/26.  Patient declined appt at this time and stated she prefers to await results of labs first.

## 2018-09-25 NOTE — Telephone Encounter (Signed)
-----   Message from Caren Macadam, MD sent at 09/18/2018  9:54 PM EDT ----- Please schedule bloodwork for her. She will also need appointment for the near syncope episodes if she wants to schedule now.

## 2018-09-27 ENCOUNTER — Other Ambulatory Visit: Payer: Self-pay

## 2018-09-29 ENCOUNTER — Other Ambulatory Visit (INDEPENDENT_AMBULATORY_CARE_PROVIDER_SITE_OTHER): Payer: Federal, State, Local not specified - PPO

## 2018-09-29 ENCOUNTER — Other Ambulatory Visit: Payer: Self-pay

## 2018-09-29 DIAGNOSIS — R42 Dizziness and giddiness: Secondary | ICD-10-CM | POA: Diagnosis not present

## 2018-09-29 DIAGNOSIS — E785 Hyperlipidemia, unspecified: Secondary | ICD-10-CM

## 2018-09-29 DIAGNOSIS — M810 Age-related osteoporosis without current pathological fracture: Secondary | ICD-10-CM

## 2018-09-29 LAB — COMPREHENSIVE METABOLIC PANEL
ALT: 13 U/L (ref 0–35)
AST: 16 U/L (ref 0–37)
Albumin: 4.2 g/dL (ref 3.5–5.2)
Alkaline Phosphatase: 78 U/L (ref 39–117)
BUN: 12 mg/dL (ref 6–23)
CO2: 32 mEq/L (ref 19–32)
Calcium: 9.9 mg/dL (ref 8.4–10.5)
Chloride: 100 mEq/L (ref 96–112)
Creatinine, Ser: 0.73 mg/dL (ref 0.40–1.20)
GFR: 80.05 mL/min (ref >60.00)
Glucose, Bld: 84 mg/dL (ref 70–99)
Potassium: 4 mEq/L (ref 3.5–5.1)
Sodium: 139 mEq/L (ref 135–145)
Total Bilirubin: 0.5 mg/dL (ref 0.2–1.2)
Total Protein: 7.3 g/dL (ref 6.0–8.3)

## 2018-09-29 LAB — CBC WITH DIFFERENTIAL/PLATELET
Basophils Absolute: 0 10*3/uL (ref 0.0–0.1)
Basophils Relative: 0.5 % (ref 0.0–3.0)
Eosinophils Absolute: 0.1 10*3/uL (ref 0.0–0.7)
Eosinophils Relative: 1.7 % (ref 0.0–5.0)
HCT: 42.3 % (ref 36.0–46.0)
Hemoglobin: 14.2 g/dL (ref 12.0–15.0)
Lymphocytes Relative: 29.6 % (ref 12.0–46.0)
Lymphs Abs: 1.6 10*3/uL (ref 0.7–4.0)
MCHC: 33.5 g/dL (ref 30.0–36.0)
MCV: 92.2 fl (ref 78.0–100.0)
Monocytes Absolute: 0.5 10*3/uL (ref 0.1–1.0)
Monocytes Relative: 8.7 % (ref 3.0–12.0)
Neutro Abs: 3.2 10*3/uL (ref 1.4–7.7)
Neutrophils Relative %: 59.5 % (ref 43.0–77.0)
Platelets: 297 10*3/uL (ref 150.0–400.0)
RBC: 4.59 Mil/uL (ref 3.87–5.11)
RDW: 11.9 % (ref 11.5–15.5)
WBC: 5.4 10*3/uL (ref 4.0–10.5)

## 2018-09-29 LAB — TSH: TSH: 1.61 u[IU]/mL (ref 0.35–4.50)

## 2018-09-29 LAB — VITAMIN B12: Vitamin B-12: 646 pg/mL (ref 211–911)

## 2018-09-29 LAB — LIPID PANEL
Cholesterol: 255 mg/dL — ABNORMAL HIGH (ref 0–200)
HDL: 70.9 mg/dL (ref >39.00)
LDL Cholesterol: 162 mg/dL — ABNORMAL HIGH (ref 0–99)
NonHDL: 183.95
Total CHOL/HDL Ratio: 4
Triglycerides: 112 mg/dL (ref 0.0–149.0)
VLDL: 22.4 mg/dL (ref 0.0–40.0)

## 2018-09-29 LAB — VITAMIN D 25 HYDROXY (VIT D DEFICIENCY, FRACTURES): VITD: 52.48 ng/mL (ref 30.00–100.00)

## 2018-10-10 ENCOUNTER — Ambulatory Visit (HOSPITAL_COMMUNITY)
Admission: EM | Admit: 2018-10-10 | Discharge: 2018-10-10 | Disposition: A | Discharge status: Home / Self Care | Payer: Federal, State, Local not specified - PPO | Attending: Family Medicine | Admitting: Family Medicine

## 2018-10-10 ENCOUNTER — Other Ambulatory Visit: Payer: Self-pay

## 2018-10-10 ENCOUNTER — Ambulatory Visit (INDEPENDENT_AMBULATORY_CARE_PROVIDER_SITE_OTHER): Payer: Federal, State, Local not specified - PPO

## 2018-10-10 ENCOUNTER — Encounter (HOSPITAL_COMMUNITY): Payer: Self-pay

## 2018-10-10 DIAGNOSIS — S99911A Unspecified injury of right ankle, initial encounter: Secondary | ICD-10-CM | POA: Diagnosis not present

## 2018-10-10 DIAGNOSIS — W19XXXA Unspecified fall, initial encounter: Secondary | ICD-10-CM

## 2018-10-10 DIAGNOSIS — M79671 Pain in right foot: Secondary | ICD-10-CM | POA: Diagnosis not present

## 2018-10-10 DIAGNOSIS — S82831A Other fracture of upper and lower end of right fibula, initial encounter for closed fracture: Secondary | ICD-10-CM

## 2018-10-10 DIAGNOSIS — S99921A Unspecified injury of right foot, initial encounter: Secondary | ICD-10-CM

## 2018-10-10 DIAGNOSIS — M25571 Pain in right ankle and joints of right foot: Secondary | ICD-10-CM | POA: Diagnosis not present

## 2018-10-10 NOTE — ED Provider Notes (Signed)
[facility]   [id number] 10/10/18 Arrival Time: 3299  ASSESSMENT & PLAN:  1. Right ankle injury, initial encounter   2. Injury of right foot, initial encounter   3. Other closed fracture of distal end of right fibula, initial encounter     I have personally viewed the imaging studies ordered this visit. Non-displaced distal fibular fracture.  Fitted in CAM walker. WBAT. Declines crutches. See AVS for written d/c information given. Work note provided.  Recommend: Follow-up Information    Schedule an appointment as soon as possible for a visit  with Ortho, [facility].   Specialty: Specialist Contact information: [address] [place] [address] [phone]          Otherwise may f/u here as needed. Reviewed expectations re: course of current medical issues. Questions answered. Outlined signs and symptoms indicating need for more acute intervention. Patient verbalized understanding. After Visit Summary given.  SUBJECTIVE: History from: patient. Andrea Fields is a 63 y.o. female who reports fairly persistent mild to moderate pain of her right lateral ankle and lateral proximal foot; described as aching; without radiation. Onset: abrupt, yesterday; was hiking and stepped off small ledge with described ankle inversion; immediate discomfort; able to bear weight immediately (with pain) and since. Symptoms have progressed to a point and plateaued since beginning. Aggravating factors: weight bearing and prolonged walking/standing. Alleviating factors: rest. Associated symptoms: none reported. Extremity sensation changes or weakness: none. Self treatment: none reported History of similar: no.  Past Surgical History:  Procedure Laterality Date  . ABCESS DRAINAGE  2012   sinus  . BREAST BIOPSY    . BREAST LUMPECTOMY Right 1992  . BREAST SURGERY  1992, 2002   biopsy  . LAPAROSCOPY  1990  . MOUTH RANULA EXCISION  1996  . ORIF FOOT FRACTURE  2007   left 5th metatarsal    ROS: As per HPI. All other systems negative.   OBJECTIVE:  Vitals:   10/10/18 1344 10/10/18 1346  BP:  120/67  Pulse:  78  Resp:  18  Temp:  98 F (36.7 C)  TempSrc:  Oral  SpO2:  100%  Weight: 47.6 kg     General appearance: alert; no distress HEENT: [place]; AT Neck: supple with FROM Resp: unlabored respirations Extremities: . RLE: warm and well perfused; poorly localized mild to moderate tenderness over right lateral ankle extending toward proximal lateral foot; without gross deformities; with mild swelling; with bruising; ROM: normal with reported discomfort CV: brisk extremity capillary refill of RLE; 2+ DP pulse and PT pulse of RLE. Skin: warm and dry; no visible rashes Neurologic: gait normal but does favor R foot/ankle; normal reflexes of RLE; normal sensation of RLE; normal strength of RLE Psychological: alert and cooperative; normal mood and affect  Imaging: Dg Ankle Complete Right  Result Date: 10/10/2018 CLINICAL DATA:  Right ankle pain after fall yesterday. EXAM: RIGHT ANKLE - COMPLETE 3+ VIEW COMPARISON:  March 29, 2012. FINDINGS: Nondisplaced fracture is seen involving the distal right fibula with overlying soft tissue swelling. No other bony abnormality is noted. Joint spaces are intact. IMPRESSION: Nondisplaced distal right fibular fracture is noted with overlying soft tissue swelling. Electronically Signed   By: Marijo Conception M.D.   On: 10/10/2018 14:42   Dg Foot Complete Right  Result Date: 10/10/2018 CLINICAL DATA:  Right foot pain after fall yesterday. EXAM: RIGHT FOOT COMPLETE - 3+ VIEW COMPARISON:  None. FINDINGS: There is no evidence of fracture or dislocation. Mild hallux  valgus deformity of the first metatarsophalangeal joint is noted. No other joint abnormality is noted. Soft tissues are unremarkable. IMPRESSION: Mild hallux valgus deformity of first metatarsophalangeal joint. No acute abnormality seen in the right foot.  Electronically Signed   By: Lupita RaiderJames  Green Jr M.D.   On: 10/10/2018 14:47     Allergies  Allergen Reactions  . Azithromycin     Joint pain  . Boniva [Ibandronic Acid]   . Belva AgeeVectrin [Minocycline Hcl]     Past Medical History:  Diagnosis Date  . Anemia    pt does not recall having this, CBC 05/2015 normal  . Anxiety and depression 07/03/2015   -sees psychiatry, Vear ClockLeslie O'Neil   . Chronic low back pain 05/13/2014  . DCIS (ductal carcinoma in situ)    1992 dx during pregnancy, s/p lumpectomy remotely and no further treatment; benign biopsy in 2002  . Left ankle sprain    w/ lat mal avulsion fx in 2017; saw Dr. Victorino DikeHewitt  . Osteoporosis 07/03/2015   -seeing Dr. Kathi LudwigSyed -did not tolerate boniva   . Rheumatoid arthritis(714.0)    Dx in 2017, seeing Dr. Kathi LudwigSyed, Rheumatologist  . Sjogren's syndrome [facility](HCC)    Sees Rheumatologist   Social History   Socioeconomic History  . Marital status: Married    Spouse name: Not on file  . Number of children: 2  . Years of education: Not on file  . Highest education level: Not on file  Occupational History  . Occupation: Teacher, adult educationN    Employer: [employer]  Social Needs  . Financial resource strain: Not on file  . Food insecurity    Worry: Not on file    Inability: Not on file  . Transportation needs    Medical: Not on file    Non-medical: Not on file  Tobacco Use  . Smoking status: Current Some Day Smoker    Types: Cigarettes  . Smokeless tobacco: Never Used  . Tobacco comment: 3 cigarette per day  Substance and Sexual Activity  . Alcohol use: Yes  . Drug use: No  . Sexual activity: Not on file  Lifestyle  . Physical activity    Days per week: Not on file    Minutes per session: Not on file  . Stress: Not on file  Relationships  . Social Musicianconnections    Talks on phone: Not on file    Gets together: Not on file    Attends religious service: Not on file    Active member of club or organization: Not on file    Attends meetings of clubs or  organizations: Not on file    Relationship status: Not on file  Other Topics Concern  . Not on file  Social History Narrative   Work or School: Charity fundraiserN, 3 days per week      Home Situation: lives with husband - he is sick with prostate ca; adopted son and daughter is Human resources officerbiological      Spiritual Beliefs: Catholic - very active in the faith      Lifestyle: walking on a regular basis; diet is healthy      Drinks very little red wine, uses caution.      Family History  Problem Relation Age of Onset  . Hyperlipidemia Mother   . Diabetes Mother   . Dementia Mother   . Parkinson's disease Mother   . Hyperlipidemia Father   . Heart block Father   . Heart disease Father   . Osteoarthritis Father   .  Other Father        optic ischemia  . Cancer Maternal Aunt        breast  . Arthritis Maternal Grandmother   . Arthritis Maternal Grandfather   . Arthritis Paternal Grandmother   . Diabetes Paternal Grandmother   . Arthritis Paternal Grandfather   . Asthma Sister   . Sleep apnea Brother   . Arthritis Sister   . Other Sister   . Healthy Brother    Past Surgical History:  Procedure Laterality Date  . ABCESS DRAINAGE  2012   sinus  . BREAST BIOPSY    . BREAST LUMPECTOMY Right 1992  . BREAST SURGERY  1992, 2002   biopsy  . LAPAROSCOPY  1990  . MOUTH RANULA EXCISION  1996  . ORIF FOOT FRACTURE  2007   left 5th metatarsal      Mardella Layman, MD 10/11/18 [phone]

## 2018-10-10 NOTE — ED Triage Notes (Signed)
Pt states she fell while hiking and she injured her right foot. This happened yesterday.

## 2018-10-10 NOTE — Discharge Instructions (Signed)
If not allergic, you may use over the counter ibuprofen or acetaminophen as needed. ° °

## 2018-10-18 DIAGNOSIS — M25571 Pain in right ankle and joints of right foot: Secondary | ICD-10-CM | POA: Diagnosis not present

## 2018-10-19 DIAGNOSIS — M810 Age-related osteoporosis without current pathological fracture: Secondary | ICD-10-CM | POA: Diagnosis not present

## 2018-10-20 ENCOUNTER — Other Ambulatory Visit: Payer: Self-pay | Admitting: Family Medicine

## 2018-10-20 DIAGNOSIS — Z1231 Encounter for screening mammogram for malignant neoplasm of breast: Secondary | ICD-10-CM

## 2018-10-30 DIAGNOSIS — M25571 Pain in right ankle and joints of right foot: Secondary | ICD-10-CM | POA: Diagnosis not present

## 2018-11-20 DIAGNOSIS — M25571 Pain in right ankle and joints of right foot: Secondary | ICD-10-CM | POA: Diagnosis not present

## 2018-11-22 DIAGNOSIS — Z79899 Other long term (current) drug therapy: Secondary | ICD-10-CM | POA: Diagnosis not present

## 2018-11-22 DIAGNOSIS — H1789 Other corneal scars and opacities: Secondary | ICD-10-CM | POA: Diagnosis not present

## 2018-11-22 DIAGNOSIS — H2513 Age-related nuclear cataract, bilateral: Secondary | ICD-10-CM | POA: Diagnosis not present

## 2018-12-05 ENCOUNTER — Other Ambulatory Visit: Payer: Self-pay

## 2018-12-05 ENCOUNTER — Ambulatory Visit
Admission: RE | Admit: 2018-12-05 | Discharge: 2018-12-05 | Disposition: A | Discharge status: Home / Self Care | Payer: Federal, State, Local not specified - PPO | Source: Ambulatory Visit | Attending: Family Medicine | Admitting: Family Medicine

## 2018-12-05 DIAGNOSIS — Z1231 Encounter for screening mammogram for malignant neoplasm of breast: Secondary | ICD-10-CM | POA: Diagnosis not present

## 2018-12-06 ENCOUNTER — Other Ambulatory Visit: Payer: Self-pay | Admitting: Family Medicine

## 2019-03-27 DIAGNOSIS — M359 Systemic involvement of connective tissue, unspecified: Secondary | ICD-10-CM | POA: Diagnosis not present

## 2019-03-27 DIAGNOSIS — M0579 Rheumatoid arthritis with rheumatoid factor of multiple sites without organ or systems involvement: Secondary | ICD-10-CM | POA: Diagnosis not present

## 2019-04-05 DIAGNOSIS — F4323 Adjustment disorder with mixed anxiety and depressed mood: Secondary | ICD-10-CM | POA: Diagnosis not present

## 2019-04-24 DIAGNOSIS — M3500 Sicca syndrome, unspecified: Secondary | ICD-10-CM | POA: Diagnosis not present

## 2019-04-24 DIAGNOSIS — M359 Systemic involvement of connective tissue, unspecified: Secondary | ICD-10-CM | POA: Diagnosis not present

## 2019-04-24 DIAGNOSIS — M810 Age-related osteoporosis without current pathological fracture: Secondary | ICD-10-CM | POA: Diagnosis not present

## 2019-04-24 DIAGNOSIS — E559 Vitamin D deficiency, unspecified: Secondary | ICD-10-CM | POA: Diagnosis not present

## 2019-04-24 DIAGNOSIS — M0579 Rheumatoid arthritis with rheumatoid factor of multiple sites without organ or systems involvement: Secondary | ICD-10-CM | POA: Diagnosis not present

## 2019-04-30 DIAGNOSIS — F4323 Adjustment disorder with mixed anxiety and depressed mood: Secondary | ICD-10-CM | POA: Diagnosis not present

## 2019-05-16 ENCOUNTER — Other Ambulatory Visit: Payer: Self-pay | Admitting: *Deleted

## 2019-05-16 MED ORDER — DULOXETINE HCL 60 MG PO CPEP: 60.0000 mg | ORAL_CAPSULE | Freq: Every day | ORAL | 0 refills | Status: DC

## 2019-05-16 NOTE — Telephone Encounter (Signed)
Rx done. 

## 2019-06-04 DIAGNOSIS — F4323 Adjustment disorder with mixed anxiety and depressed mood: Secondary | ICD-10-CM | POA: Diagnosis not present

## 2019-06-17 ENCOUNTER — Other Ambulatory Visit: Payer: Self-pay | Admitting: Family Medicine

## 2019-07-09 DIAGNOSIS — F4323 Adjustment disorder with mixed anxiety and depressed mood: Secondary | ICD-10-CM | POA: Diagnosis not present

## 2019-07-21 DIAGNOSIS — F4323 Adjustment disorder with mixed anxiety and depressed mood: Secondary | ICD-10-CM | POA: Diagnosis not present

## 2019-08-04 DIAGNOSIS — F4323 Adjustment disorder with mixed anxiety and depressed mood: Secondary | ICD-10-CM | POA: Diagnosis not present

## 2019-08-20 DIAGNOSIS — F4323 Adjustment disorder with mixed anxiety and depressed mood: Secondary | ICD-10-CM | POA: Diagnosis not present

## 2019-09-02 ENCOUNTER — Other Ambulatory Visit: Payer: Self-pay | Admitting: Family Medicine

## 2019-09-05 DIAGNOSIS — F4323 Adjustment disorder with mixed anxiety and depressed mood: Secondary | ICD-10-CM | POA: Diagnosis not present

## 2019-10-02 DIAGNOSIS — F4323 Adjustment disorder with mixed anxiety and depressed mood: Secondary | ICD-10-CM | POA: Diagnosis not present

## 2019-10-29 DIAGNOSIS — F438 Other reactions to severe stress: Secondary | ICD-10-CM | POA: Diagnosis not present

## 2019-10-30 DIAGNOSIS — Z23 Encounter for immunization: Secondary | ICD-10-CM | POA: Diagnosis not present

## 2019-10-30 DIAGNOSIS — M3500 Sicca syndrome, unspecified: Secondary | ICD-10-CM | POA: Diagnosis not present

## 2019-10-30 DIAGNOSIS — M0579 Rheumatoid arthritis with rheumatoid factor of multiple sites without organ or systems involvement: Secondary | ICD-10-CM | POA: Diagnosis not present

## 2019-10-30 DIAGNOSIS — M810 Age-related osteoporosis without current pathological fracture: Secondary | ICD-10-CM | POA: Diagnosis not present

## 2019-10-30 DIAGNOSIS — M359 Systemic involvement of connective tissue, unspecified: Secondary | ICD-10-CM | POA: Diagnosis not present

## 2019-10-30 DIAGNOSIS — E559 Vitamin D deficiency, unspecified: Secondary | ICD-10-CM | POA: Diagnosis not present

## 2019-10-31 ENCOUNTER — Other Ambulatory Visit: Payer: Self-pay | Admitting: Family Medicine

## 2019-10-31 DIAGNOSIS — Z1231 Encounter for screening mammogram for malignant neoplasm of breast: Secondary | ICD-10-CM

## 2019-11-07 ENCOUNTER — Other Ambulatory Visit: Payer: Self-pay | Admitting: Rheumatology

## 2019-11-07 DIAGNOSIS — M810 Age-related osteoporosis without current pathological fracture: Secondary | ICD-10-CM

## 2019-11-23 DIAGNOSIS — M25562 Pain in left knee: Secondary | ICD-10-CM | POA: Diagnosis not present

## 2019-12-05 DIAGNOSIS — H5213 Myopia, bilateral: Secondary | ICD-10-CM | POA: Diagnosis not present

## 2019-12-05 DIAGNOSIS — H2513 Age-related nuclear cataract, bilateral: Secondary | ICD-10-CM | POA: Diagnosis not present

## 2019-12-05 DIAGNOSIS — Z79899 Other long term (current) drug therapy: Secondary | ICD-10-CM | POA: Diagnosis not present

## 2019-12-06 ENCOUNTER — Ambulatory Visit
Admission: RE | Admit: 2019-12-06 | Discharge: 2019-12-06 | Disposition: A | Discharge status: Home / Self Care | Payer: PPO | Source: Ambulatory Visit | Attending: Family Medicine | Admitting: Family Medicine

## 2019-12-06 ENCOUNTER — Other Ambulatory Visit: Payer: Self-pay

## 2019-12-06 DIAGNOSIS — Z1231 Encounter for screening mammogram for malignant neoplasm of breast: Secondary | ICD-10-CM

## 2019-12-20 DIAGNOSIS — F438 Other reactions to severe stress: Secondary | ICD-10-CM | POA: Diagnosis not present

## 2020-01-02 ENCOUNTER — Encounter: Payer: PPO | Admitting: Family Medicine

## 2020-01-15 DIAGNOSIS — F438 Other reactions to severe stress: Secondary | ICD-10-CM | POA: Diagnosis not present

## 2020-02-06 ENCOUNTER — Other Ambulatory Visit: Payer: Self-pay | Admitting: Family Medicine

## 2020-02-21 ENCOUNTER — Ambulatory Visit (INDEPENDENT_AMBULATORY_CARE_PROVIDER_SITE_OTHER): Payer: PPO | Admitting: Physician Assistant

## 2020-02-21 ENCOUNTER — Encounter: Payer: Self-pay | Admitting: Physician Assistant

## 2020-02-21 ENCOUNTER — Other Ambulatory Visit: Payer: Self-pay

## 2020-02-21 VITALS — BP 142/71 | HR 79 | Ht 61.0 in | Wt 111.0 lb

## 2020-02-21 DIAGNOSIS — F17200 Nicotine dependence, unspecified, uncomplicated: Secondary | ICD-10-CM | POA: Diagnosis not present

## 2020-02-21 DIAGNOSIS — F4323 Adjustment disorder with mixed anxiety and depressed mood: Secondary | ICD-10-CM | POA: Diagnosis not present
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
## 2020-02-22 ENCOUNTER — Ambulatory Visit
Admission: RE | Admit: 2020-02-22 | Discharge: 2020-02-22 | Disposition: A | Discharge status: Home / Self Care | Payer: Federal, State, Local not specified - PPO | Source: Ambulatory Visit | Attending: Rheumatology | Admitting: Rheumatology

## 2020-02-22 DIAGNOSIS — M810 Age-related osteoporosis without current pathological fracture: Secondary | ICD-10-CM

## 2020-02-22 DIAGNOSIS — M8588 Other specified disorders of bone density and structure, other site: Secondary | ICD-10-CM | POA: Diagnosis not present

## 2020-02-22 DIAGNOSIS — Z780 Asymptomatic menopausal state: Secondary | ICD-10-CM | POA: Diagnosis not present

## 2020-02-24 ENCOUNTER — Encounter: Payer: Self-pay | Admitting: Physician Assistant

## 2020-03-07 ENCOUNTER — Encounter: Payer: Self-pay | Admitting: Family Medicine

## 2020-03-07 ENCOUNTER — Other Ambulatory Visit: Payer: Self-pay

## 2020-03-07 ENCOUNTER — Ambulatory Visit (INDEPENDENT_AMBULATORY_CARE_PROVIDER_SITE_OTHER): Payer: PPO | Admitting: Family Medicine

## 2020-03-07 VITALS — BP 102/78 | HR 85 | Temp 98.1°F | Ht 61.0 in | Wt 115.5 lb

## 2020-03-07 DIAGNOSIS — Z0000 Encounter for general adult medical examination without abnormal findings: Secondary | ICD-10-CM

## 2020-03-07 DIAGNOSIS — Z13220 Encounter for screening for lipoid disorders: Secondary | ICD-10-CM | POA: Diagnosis not present

## 2020-03-07 DIAGNOSIS — R238 Other skin changes: Secondary | ICD-10-CM

## 2020-03-07 DIAGNOSIS — F32A Depression, unspecified: Secondary | ICD-10-CM

## 2020-03-07 DIAGNOSIS — Z79899 Other long term (current) drug therapy: Secondary | ICD-10-CM

## 2020-03-07 DIAGNOSIS — Z23 Encounter for immunization: Secondary | ICD-10-CM

## 2020-03-07 DIAGNOSIS — Z1211 Encounter for screening for malignant neoplasm of colon: Secondary | ICD-10-CM

## 2020-03-07 DIAGNOSIS — M3500 Sicca syndrome, unspecified: Secondary | ICD-10-CM

## 2020-03-07 DIAGNOSIS — F419 Anxiety disorder, unspecified: Secondary | ICD-10-CM

## 2020-03-07 DIAGNOSIS — N393 Stress incontinence (female) (male): Secondary | ICD-10-CM

## 2020-03-07 DIAGNOSIS — M069 Rheumatoid arthritis, unspecified: Secondary | ICD-10-CM | POA: Diagnosis not present

## 2020-03-07 DIAGNOSIS — M810 Age-related osteoporosis without current pathological fracture: Secondary | ICD-10-CM | POA: Diagnosis not present

## 2020-03-07 MED ORDER — SHINGRIX 50 MCG/0.5ML IM SUSR: 0.5000 mL | Freq: Once | INTRAMUSCULAR | 0 refills | Status: AC

## 2020-03-07 NOTE — Patient Instructions (Addendum)
Please make follow up with gynecology and dermatology.   You can get shingrix vaccine at your pharmacy (I sent rx)

## 2020-03-07 NOTE — Progress Notes (Signed)
Andrea Fields DOB: 18-Nov-1955 Encounter date: 03/07/2020  This is a 65 y.o. female who presents for complete physical   History of present illness/Additional concerns: Last visit with me was 09/2018.  Rheumatoid arthritis/Sjogren's: Follows with Dr. Kathi Ludwig. She is achy a lot. She doesn't like taking medication if she can avoid it. Taking plaquenil now. Feels more achy since turning 64; right side of knee. Groin pain left side - thinks hip. But feels that these aches/pains are tolerable. Usually takes 2 aleve twice a week. Feels better when she moves, so she walks a lot. Did 24 mile walk [place] make a wish. Keeps moving; tries to do exercises with hands and stays active. Just more stiff.   Osteoporosis: Prolia.  Last DEXA was 02/22/2020.  Slight improvement in lumbar density, no change in hip density.   Anxiety/depression: Cymbalta - mood has been so so. She is seeing therapist. She is in grief group. She has also joined LGBT support group to help with her support of children.   She does follow regularly with gynecology, but hasn't been there for awhile. She would like to see gyn at [place] outpatient.   Has noted that she isn't emptying bladder as well. Really has to bear down to get all of urine out. Also does leak. Wears a pad all the time. This is not new. Leaking is more with sneeze/cough. Occasionally just notes little leak as she nears time to go to the bathroom.  Only one night time urination. Drinking more at night.   Normal mammogram 12/2019. Last Cologuard was 12/2015.  This was negative. Second shingles - didn't complete this.  Pneumococcal 23 - we can complete this today. Covid and third injection?  Past Medical History:  Diagnosis Date  . Anemia    pt does not recall having this, CBC 05/2015 normal  . Anxiety and depression 07/03/2015   -sees psychiatry, [facility]   . Chronic low back pain 05/13/2014  . DCIS (ductal carcinoma in situ)    1992 dx  during pregnancy, s/p lumpectomy remotely and no further treatment; benign biopsy in 2002  . Left ankle sprain    w/ lat mal avulsion fx in 2017; saw Dr. Victorino Dike  . Osteoporosis 07/03/2015   -seeing Dr. Kathi Ludwig -did not tolerate boniva   . Rheumatoid arthritis(714.0)    Dx in 2017, seeing Dr. Kathi Ludwig, Rheumatologist  . Sjogren's syndrome [facility])    Sees Rheumatologist   Past Surgical History:  Procedure Laterality Date  . ABCESS DRAINAGE  2012   sinus  . BREAST BIOPSY    . BREAST LUMPECTOMY Right 1992  . BREAST SURGERY  1992, 2002   biopsy  . LAPAROSCOPY  1990  . laparoscopy    . MOUTH RANULA EXCISION  1996  . ORIF FOOT FRACTURE  2007   left 5th metatarsal   Allergies  Allergen Reactions  . Azithromycin     Joint pain  . Boniva [Ibandronic Acid]   . Vectrin [Minocycline Hcl]    Current Meds  Medication Sig  . acetaminophen (TYLENOL) 500 MG tablet 2 tablets as needed  . Ascorbic Acid (VITAMIN C) 1000 MG tablet Take 1,000 mg by mouth daily.  . Cholecalciferol (VITAMIN D PO) Take 2,000 Units by mouth daily.   . cyanocobalamin 100 MCG tablet Take 100 mcg by mouth daily.  Marland Kitchen denosumab (PROLIA) 60 MG/ML SOLN injection Inject 60 mg into the skin every 6 (six) months. Administer in upper arm, thigh, or abdomen  .  DULoxetine (CYMBALTA) 60 MG capsule TAKE 1 CAPSULE BY MOUTH EVERY DAY  . fish oil-omega-3 fatty acids 1000 MG capsule Take 2 g by mouth daily.  . hydroxychloroquine (PLAQUENIL) 200 MG tablet Take 1 tablet by mouth daily.  . Multiple Vitamin (MULTIVITAMIN) tablet Take 1 tablet by mouth daily.  . naproxen sodium (ANAPROX) 220 MG tablet Take 220 mg by mouth as needed.   Marland Kitchen omeprazole (PRILOSEC) 20 MG capsule Take 20 mg by mouth daily.  . Probiotic Product (PROBIOTIC DAILY PO) Take 1 capsule by mouth daily.  Marland Kitchen Zoster Vaccine Adjuvanted [facility]) injection Inject 0.5 mLs into the muscle once for 1 dose.   Social History   Tobacco Use  . Smoking status: Current Every Day Smoker     Packs/day: 0.25    Types: Cigarettes  . Smokeless tobacco: Never Used  Substance Use Topics  . Alcohol use: Yes    Alcohol/week: 7.0 - 10.0 standard drinks    Types: 7 - 10 Glasses of wine per week   Family History  Problem Relation Age of Onset  . Hyperlipidemia Mother   . Dementia Mother   . Parkinson's disease Mother   . Diabetes Mother   . Hyperlipidemia Father   . Heart block Father   . Heart disease Father   . Osteoarthritis Father   . Other Father        optic ischemia  . Hypertension Father   . Benign prostatic hyperplasia Father   . Cancer Maternal Aunt        breast  . Dementia Maternal Grandmother   . Osteoarthritis Maternal Grandmother   . Osteoarthritis Maternal Grandfather   . Diabetes Paternal Grandmother   . Arthritis Paternal Grandfather   . Heart disease Paternal Grandfather   . Asthma Sister   . Osteoarthritis Sister   . Sleep apnea Brother   . Other Sister        detached retina  . Osteoarthritis Sister   . Healthy Brother   . Healthy Brother   . Anxiety disorder Daughter      Review of Systems  Constitutional: Negative for activity change, appetite change, chills, fatigue, fever and unexpected weight change.  HENT: Negative for congestion, ear pain, hearing loss, sinus pressure, sinus pain, sore throat and trouble swallowing.   Eyes: Negative for pain and visual disturbance.  Respiratory: Negative for cough, chest tightness, shortness of breath and wheezing.   Cardiovascular: Negative for chest pain, palpitations and leg swelling.  Gastrointestinal: Negative for abdominal pain, blood in stool, constipation, diarrhea, nausea and vomiting.  Genitourinary: Positive for difficulty urinating (see hpi). Negative for menstrual problem.  Musculoskeletal: Positive for arthralgias. Negative for back pain.  Skin: Negative for rash.  Neurological: Negative for dizziness, weakness, numbness and headaches.  Hematological: Negative for adenopathy. Does not  bruise/bleed easily.  Psychiatric/Behavioral: Negative for sleep disturbance and suicidal ideas. The patient is not nervous/anxious.        Still grieving; increased stress. Manages ok; working with therapist.     CBC:  Lab Results  Component Value Date   WBC 5.4 09/29/2018   HGB 14.2 09/29/2018   HCT 42.3 09/29/2018   MCH 31.7 11/14/2015   MCHC 33.5 09/29/2018   RDW 11.9 09/29/2018   PLT 297.0 09/29/2018   MPV 9.3 11/14/2015   CMP: Lab Results  Component Value Date   NA 139 09/29/2018   K 4.0 09/29/2018   CL 100 09/29/2018   CO2 32 09/29/2018   GLUCOSE 84 09/29/2018  BUN 12 09/29/2018   CREATININE 0.73 09/29/2018   CREATININE 0.68 11/14/2015   GFRAA >89 11/14/2015   CALCIUM 9.9 09/29/2018   PROT 7.3 09/29/2018   BILITOT 0.5 09/29/2018   ALKPHOS 78 09/29/2018   ALT 13 09/29/2018   AST 16 09/29/2018   LIPID: Lab Results  Component Value Date   CHOL 255 (H) 09/29/2018   TRIG 112.0 09/29/2018   HDL 70.90 09/29/2018   LDLCALC 162 (H) 09/29/2018    Objective:  BP 102/78 (BP Location: Left Arm, Patient Position: Sitting, Cuff Size: Normal)   Pulse 85   Temp 98.1 F (36.7 C) (Oral)   Ht  (1.549 m)   Wt 115 lb 8 oz (52.4 kg)   SpO2 98%   BMI 21.82 kg/m   Weight: 115 lb 8 oz (52.4 kg)   BP Readings from Last 3 Encounters:  03/07/20 102/78  10/10/18 120/67  11/21/17 124/88   Wt Readings from Last 3 Encounters:  03/07/20 115 lb 8 oz (52.4 kg)  10/10/18 105 lb (47.6 kg)  11/21/17 111 lb 1.6 oz (50.4 kg)    Physical Exam Constitutional:      General: She is not in acute distress.    Appearance: She is well-developed and well-nourished.  HENT:     Head: Normocephalic and atraumatic.     Right Ear: External ear normal.     Left Ear: External ear normal.     Mouth/Throat:     Mouth: Oropharynx is clear and moist.     Pharynx: No oropharyngeal exudate.  Eyes:     Conjunctiva/sclera: Conjunctivae normal.     Pupils: Pupils are equal, round, and  reactive to light.  Neck:     Thyroid: No thyromegaly.  Cardiovascular:     Rate and Rhythm: Normal rate and regular rhythm.     Heart sounds: Normal heart sounds. No murmur heard. No friction rub. No gallop.   Pulmonary:     Effort: Pulmonary effort is normal.     Breath sounds: Normal breath sounds.  Abdominal:     General: Bowel sounds are normal. There is no distension.     Palpations: Abdomen is soft. There is no mass.     Tenderness: There is no abdominal tenderness. There is no guarding.     Hernia: No hernia is present.  Musculoskeletal:        General: No tenderness, deformity or edema. Normal range of motion.     Cervical back: Normal range of motion and neck supple.     Comments: Good range of motion of hips bilaterally.  No significant discomfort with internal or external rotation.  Mild IT band tenderness on the left.  Lymphadenopathy:     Cervical: No cervical adenopathy.  Skin:    General: Skin is warm and dry.     Findings: No rash.     Comments: Pink papule right ear.  She has had some tenderness along this ear that bothers her sleeping at night.  Neurological:     Mental Status: She is alert and oriented to person, place, and time.     Deep Tendon Reflexes: Strength normal. Reflexes normal.     Reflex Scores:      Tricep reflexes are 2+ on the right side and 2+ on the left side.      Bicep reflexes are 2+ on the right side and 2+ on the left side.      Brachioradialis reflexes are 2+ on the right side  and 2+ on the left side.      Patellar reflexes are 2+ on the right side and 2+ on the left side. Psychiatric:        Mood and Affect: Mood and affect normal.        Speech: Speech normal.        Behavior: Behavior normal.        Thought Content: Thought content normal.     Assessment/Plan: Health Maintenance Due  Topic Date Due  . Fecal DNA (Cologuard)  12/16/2018   Health Maintenance reviewed - cologuard ordered; shingrix sent to pharmacy; pneumonia  today..  1. Preventative health care Encouraged her to complete prescription sent to pharmacy for today).  Pneumococcal vaccination given today in the office.  Keep up with healthy lifestyle and regular activity.  I do feel that this is helpful for her with joint health overall. - Zoster Vaccine Adjuvanted [facility]) injection; Inject 0.5 mLs into the muscle once for 1 dose.  Dispense: 0.5 mL; Refill: 0 - Pneumococcal polysaccharide vaccine 23-valent greater than or equal to 2yo subcutaneous/IM; Future  2. Rheumatoid arthritis involving multiple sites, unspecified whether rheumatoid factor present [facility]) She is following regularly with rheumatology.  She will let me know if any additional orders need to be added to her blood work for her rheumatology.  Currently taking Plaquenil 200 mg daily. - C-reactive protein; Future - Sedimentation rate; Future  3. Osteoporosis, unspecified osteoporosis type, unspecified pathological fracture presence Bone density has been stable with slight improvement in the spine.  Continue with Prolia.  We will recheck vitamin D levels. - VITAMIN D 25 Hydroxy (Vit-D Deficiency, Fractures); Future  4. Sjogren's syndrome, with unspecified organ involvement [facility]) Following with rheumatology regularly for this.  She has good oral care and sees dentist regularly.  She uses eyedrops regularly to help with eye dryness.  5. Anxiety and depression Has been difficult since losing her husband.  I encouraged her to continue with all of her healthy behaviors to manage grieving (exercise, therapy, grief support).  She has a follow-up next month with mental health.  6. High risk medication use - CBC with Differential/Platelet; Future - Comprehensive metabolic panel; Future  7. Lipid screening - Lipid panel; Future  8. Stress incontinence We discussed regularly doing Kegel exercises.  We discussed timed bladder emptying.  She is planning to follow-up with gynecology and has  someone in mind to help with addressing her concerns.  9. Screening for colon cancer - Cologuard; Future  10. Papule on ear: Skin exam deferred today because she does have dermatologist and I did encourage her to follow-up with them.  She has a papule on her right ear at the sets of 10 at this area and I do feel that dermatology patient is warranted  Return in about 1 year (around 03/07/2021) for physical exam.  Theodis Shove, MD

## 2020-03-10 ENCOUNTER — Telehealth: Payer: Self-pay | Admitting: Family Medicine

## 2020-03-10 NOTE — Telephone Encounter (Signed)
Spoke with the pt and informed her the lab orders were faxed as below.  Patient states she wanted to have the labs done due this location being close to her home; however she was told their labs are processed through [facility] and not [facility].  I advised the pt a copy of the lab orders will be left at the front desk for her to pick up and take to the location of her choice.

## 2020-03-10 NOTE — Telephone Encounter (Signed)
Pt is calling in wanting to have her fasting labs done at [facility] in [place] on [phone]).  Pt would like to have a call back when the orders has been faxed over.

## 2020-03-10 NOTE — Telephone Encounter (Signed)
That's fine with me; please print/fax orders for her.

## 2020-03-12 ENCOUNTER — Other Ambulatory Visit: Payer: PPO

## 2020-03-13 ENCOUNTER — Other Ambulatory Visit: Payer: Self-pay

## 2020-03-13 ENCOUNTER — Other Ambulatory Visit (INDEPENDENT_AMBULATORY_CARE_PROVIDER_SITE_OTHER): Payer: PPO

## 2020-03-13 ENCOUNTER — Encounter: Payer: Self-pay | Admitting: *Deleted

## 2020-03-13 DIAGNOSIS — Z13220 Encounter for screening for lipoid disorders: Secondary | ICD-10-CM | POA: Diagnosis not present

## 2020-03-13 DIAGNOSIS — M810 Age-related osteoporosis without current pathological fracture: Secondary | ICD-10-CM | POA: Diagnosis not present

## 2020-03-13 DIAGNOSIS — M069 Rheumatoid arthritis, unspecified: Secondary | ICD-10-CM | POA: Diagnosis not present

## 2020-03-13 DIAGNOSIS — Z79899 Other long term (current) drug therapy: Secondary | ICD-10-CM

## 2020-03-13 LAB — COMPREHENSIVE METABOLIC PANEL
ALT: 13 U/L (ref 0–35)
AST: 17 U/L (ref 0–37)
Albumin: 4.5 g/dL (ref 3.5–5.2)
Alkaline Phosphatase: 45 U/L (ref 39–117)
BUN: 11 mg/dL (ref 6–23)
CO2: 31 mEq/L (ref 19–32)
Calcium: 10.1 mg/dL (ref 8.4–10.5)
Chloride: 100 mEq/L (ref 96–112)
Creatinine, Ser: 0.76 mg/dL (ref 0.40–1.20)
GFR: 81.72 mL/min (ref >60.00)
Glucose, Bld: 88 mg/dL (ref 70–99)
Potassium: 4.5 mEq/L (ref 3.5–5.1)
Sodium: 138 mEq/L (ref 135–145)
Total Bilirubin: 0.6 mg/dL (ref 0.2–1.2)
Total Protein: 7.9 g/dL (ref 6.0–8.3)

## 2020-03-13 LAB — CBC WITH DIFFERENTIAL/PLATELET
Basophils Absolute: 0 10*3/uL (ref 0.0–0.1)
Basophils Relative: 0.9 % (ref 0.0–3.0)
Eosinophils Absolute: 0.1 10*3/uL (ref 0.0–0.7)
Eosinophils Relative: 2.6 % (ref 0.0–5.0)
HCT: 41.1 % (ref 36.0–46.0)
Hemoglobin: 14.3 g/dL (ref 12.0–15.0)
Lymphocytes Relative: 30.7 % (ref 12.0–46.0)
Lymphs Abs: 1.5 10*3/uL (ref 0.7–4.0)
MCHC: 34.7 g/dL (ref 30.0–36.0)
MCV: 90.1 fl (ref 78.0–100.0)
Monocytes Absolute: 0.5 10*3/uL (ref 0.1–1.0)
Monocytes Relative: 9.3 % (ref 3.0–12.0)
Neutro Abs: 2.8 10*3/uL (ref 1.4–7.7)
Neutrophils Relative %: 56.5 % (ref 43.0–77.0)
Platelets: 292 10*3/uL (ref 150.0–400.0)
RBC: 4.55 Mil/uL (ref 3.87–5.11)
RDW: 12.3 % (ref 11.5–15.5)
WBC: 4.9 10*3/uL (ref 4.0–10.5)

## 2020-03-13 LAB — LIPID PANEL
Cholesterol: 235 mg/dL — ABNORMAL HIGH (ref 0–200)
HDL: 75.5 mg/dL (ref >39.00)
LDL Cholesterol: 141 mg/dL — ABNORMAL HIGH (ref 0–99)
NonHDL: 159.34
Total CHOL/HDL Ratio: 3
Triglycerides: 92 mg/dL (ref 0.0–149.0)
VLDL: 18.4 mg/dL (ref 0.0–40.0)

## 2020-03-13 LAB — VITAMIN D 25 HYDROXY (VIT D DEFICIENCY, FRACTURES): VITD: 46.37 ng/mL (ref 30.00–100.00)

## 2020-03-13 LAB — SEDIMENTATION RATE: Sed Rate: 20 mm/hr (ref 0–30)

## 2020-03-13 LAB — C-REACTIVE PROTEIN: CRP: <1 mg/dL (ref 0.5–20.0)

## 2020-03-25 DIAGNOSIS — H61001 Unspecified perichondritis of right external ear: Secondary | ICD-10-CM | POA: Diagnosis not present

## 2020-04-09 ENCOUNTER — Encounter: Payer: Self-pay | Admitting: *Deleted

## 2020-04-14 ENCOUNTER — Encounter: Payer: Self-pay | Admitting: Obstetrics & Gynecology

## 2020-04-14 ENCOUNTER — Ambulatory Visit (INDEPENDENT_AMBULATORY_CARE_PROVIDER_SITE_OTHER): Payer: PPO | Admitting: Obstetrics & Gynecology

## 2020-04-14 ENCOUNTER — Other Ambulatory Visit: Payer: Self-pay

## 2020-04-14 VITALS — BP 111/70 | HR 76 | Ht 61.0 in | Wt 114.0 lb

## 2020-04-14 DIAGNOSIS — N393 Stress incontinence (female) (male): Secondary | ICD-10-CM | POA: Diagnosis not present

## 2020-04-14 DIAGNOSIS — M3500 Sicca syndrome, unspecified: Secondary | ICD-10-CM | POA: Diagnosis not present

## 2020-04-14 DIAGNOSIS — E559 Vitamin D deficiency, unspecified: Secondary | ICD-10-CM | POA: Insufficient documentation

## 2020-04-14 DIAGNOSIS — K5904 Chronic idiopathic constipation: Secondary | ICD-10-CM

## 2020-04-14 DIAGNOSIS — M797 Fibromyalgia: Secondary | ICD-10-CM | POA: Insufficient documentation

## 2020-04-14 DIAGNOSIS — D0510 Intraductal carcinoma in situ of unspecified breast: Secondary | ICD-10-CM | POA: Diagnosis not present

## 2020-04-14 DIAGNOSIS — R102 Pelvic and perineal pain: Secondary | ICD-10-CM

## 2020-04-14 DIAGNOSIS — M810 Age-related osteoporosis without current pathological fracture: Secondary | ICD-10-CM | POA: Diagnosis not present

## 2020-04-14 NOTE — Progress Notes (Signed)
Subjective:     Andrea Fields is a 65 y.o. female here for a routine exam.  Current complaints: constipation and stress urinary incontinence.  Pt takes smooth move tea for BM; has not tried Miralax.  SUI is mild and she is open to pelvic PT   Gynecologic History No LMP recorded. Patient is postmenopausal. Contraception: post menopausal status Last Pap: [facility]; normal per patient (had cryo in 17s and all nml paps since) Last mammogram: 12/2019. Results were: normal  Obstetric History OB History  Gravida Para Term Preterm AB Living  1 1 1     1   SAB IAB Ectopic Multiple Live Births               # Outcome Date GA Lbr Len/2nd Weight Sex Delivery Anes PTL Lv  1 Term              The following portions of the patient's history were reviewed and updated as appropriate: allergies, current medications, past family history, past medical history, past social history, past surgical history and problem list.  Review of Systems Pertinent items noted in HPI and remainder of comprehensive ROS otherwise negative.    Objective:      Vitals:   04/14/20 1102  BP: 111/70  Pulse: 76  Weight: 114 lb (51.7 kg)  Height: 5\' 1"  (1.549 m)   Vitals:  WNL General appearance: alert, cooperative and no distress  HEENT: Normocephalic, without obvious abnormality, atraumatic Eyes: negative Throat: lips, mucosa, and tongue normal; teeth and gums normal  Respiratory: Clear to auscultation bilaterally  CV: Regular rate and rhythm  Breasts:  Normal appearance, no masses or tenderness, no nipple retraction or dimpling  GI: Soft, non-tender; bowel sounds normal; no masses,  no organomegaly  GU: Pt declined pelvic exam  Vagina: Pt declined pelvic exam  Cervix: Pt declined pelvic exam  Uterus:  Pt declined pelvic exam  Adnexa: Pt declined pelvic exam  Musculoskeletal: No edema, redness or tenderness in the calves or thighs  Skin: No lesions or rash  Lymphatic: Axillary adenopathy: none      Psychiatric: Normal mood and behavior        Assessment:    Healthy female exam.   Constipation Stressurinary incontinence   Plan:   1.  Yearly mammograms 2.  Pt is >65 with no recent history of abnml pap smears.  Cervical screening not recommended by ACOG. 3.  Self vulvar exams every 3 months 4. Pelvic PT for stress urinary incontinence.   5.  Squatty potty and Miralax for constipation  6.  Follow up PCP for other routine health maintenance 7.  Continue seeing specialist for osteoporosis.    45 minutes spent with patient during exam, counseling about SUI and constipation, reviewing screening recommendations, review of records & documentation.

## 2020-04-14 NOTE — Progress Notes (Signed)
Last mam 12/06/19- negative

## 2020-04-22 ENCOUNTER — Other Ambulatory Visit: Payer: Self-pay

## 2020-04-22 ENCOUNTER — Encounter: Payer: Self-pay | Admitting: Physician Assistant

## 2020-04-22 ENCOUNTER — Ambulatory Visit (INDEPENDENT_AMBULATORY_CARE_PROVIDER_SITE_OTHER): Payer: PPO | Admitting: Physician Assistant

## 2020-04-22 DIAGNOSIS — F4323 Adjustment disorder with mixed anxiety and depressed mood: Secondary | ICD-10-CM | POA: Diagnosis not present

## 2020-04-22 DIAGNOSIS — F17200 Nicotine dependence, unspecified, uncomplicated: Secondary | ICD-10-CM

## 2020-04-22 NOTE — Progress Notes (Signed)
[facility]  Patient ID: Andrea Fields,  MRN: [id number]  PCP: Wynn Banker, MD  Date of Evaluation: 04/22/2020 Time spent:20 minutes  Chief Complaint:  Chief Complaint    Anxiety; Depression; Follow-up      HISTORY/CURRENT STATUS: HPI For routine f/u.   At LOV, we decided to leave the Cymbalta at the same dose.  She has not wanted to increase it.  A lot of what she is dealing with the circumstantial.  Family discord especially with her daughter right now.  States that she gives her a lot of sadness.  Family is really important to [name] and it saddens her that her daughter does not feel the same way.  Patient is able to enjoy things.  Energy and motivation are good.  Sleeps okay.  Not isolating.  Does not cry easily.  Appetite and weight are stable.  No suicidal or homicidal thoughts.  No increased energy with decreased need for sleep, no increased spending, grandiosity, no suicidal or homicidal thoughts.  Denies dizziness, syncope, seizures, numbness, tingling, tremor, tics, unsteady gait, slurred speech, confusion. Denies muscle or joint pain, stiffness, or dystonia.  Individual Medical History/ Review of Systems: Changes? :No    Past medications for mental health diagnoses include: Lexapro, Cymbalta   Allergies: Azithromycin, Boniva [ibandronic acid], Minocycline hcl, and Vectrin [minocycline hcl]  Current Medications:  Current Outpatient Medications:  .  acetaminophen (TYLENOL) 500 MG tablet, 2 tablets as needed, Disp: , Rfl:  .  Ascorbic Acid (VITAMIN C) 1000 MG tablet, Take 1,000 mg by mouth daily., Disp: , Rfl:  .  Cholecalciferol (VITAMIN D PO), Take 2,000 Units by mouth daily. , Disp: , Rfl:  .  cyanocobalamin 100 MCG tablet, Take 100 mcg by mouth daily., Disp: , Rfl:  .  denosumab (PROLIA) 60 MG/ML SOLN injection, Inject 60 mg into the skin every 6 (six) months. Administer in upper arm, thigh, or abdomen, Disp: , Rfl:  .  DULoxetine (CYMBALTA) 60  MG capsule, TAKE 1 CAPSULE BY MOUTH EVERY DAY, Disp: 90 capsule, Rfl: 1 .  fish oil-omega-3 fatty acids 1000 MG capsule, Take 2 g by mouth daily., Disp: , Rfl:  .  hydroxychloroquine (PLAQUENIL) 200 MG tablet, Take 1 tablet by mouth daily., Disp: , Rfl:  .  Multiple Vitamin (MULTIVITAMIN) tablet, Take 1 tablet by mouth daily., Disp: , Rfl:  .  naproxen sodium (ANAPROX) 220 MG tablet, Take 220 mg by mouth as needed. , Disp: , Rfl:  .  Probiotic Product (PROBIOTIC DAILY PO), Take 1 capsule by mouth daily., Disp: , Rfl:  Medication Side Effects: none  Family Medical/ Social History: Changes? No  MENTAL HEALTH EXAM:  There were no vitals taken for this visit.There is no height or weight on file to calculate BMI.  General Appearance: Casual, Neat and Well Groomed  Eye Contact:  Good  Speech:  Clear and Coherent, Normal Rate and Talkative  Volume:  Normal  Mood:  Euthymic  Affect:  Appropriate  Thought Process:  Goal Directed and Descriptions of Associations: Intact  Orientation:  Full (Time, Place, and Person)  Thought Content: Logical   Suicidal Thoughts:  No  Homicidal Thoughts:  No  Memory:  WNL  Judgement:  Good  Insight:  Good  Psychomotor Activity:  Normal  Concentration:  Concentration: Good and Attention Span: Good  Recall:  Good  Fund of Knowledge: Good  Language: Good  Assets:  Desire for Improvement  ADL's:  Intact  Cognition: WNL  Prognosis:  Good    DIAGNOSES:    ICD-10-CM   1. Situational mixed anxiety and depressive disorder  F43.23   2. Smoker  F17.200     Receiving Psychotherapy: Yes  [name]   RECOMMENDATIONS:  PDMP reviewed. I provided 20 mins of face to face time during this encounter including time spent before and after the visit reviewing records and charting. We again discussed the Cymbalta.  She prefers not to increase the dose but will let me know in the future if she would like to do that.  She will either continue to see [name] or ask  for another recommendation.  She has been seeing [name] for around 14 years and wonders if having a fresh set of eyes and ears would be helpful.  She was given [name]'s name. Smoking cessation was discussed. Continue Cymbalta 60 mg, 1 p.o. daily.  Her PCP prescribes that. Return as needed.  [name], PA-C

## 2020-04-28 ENCOUNTER — Encounter: Payer: Self-pay | Admitting: Family Medicine

## 2020-04-29 DIAGNOSIS — M359 Systemic involvement of connective tissue, unspecified: Secondary | ICD-10-CM | POA: Diagnosis not present

## 2020-04-29 DIAGNOSIS — M19041 Primary osteoarthritis, right hand: Secondary | ICD-10-CM | POA: Diagnosis not present

## 2020-04-29 DIAGNOSIS — E559 Vitamin D deficiency, unspecified: Secondary | ICD-10-CM | POA: Diagnosis not present

## 2020-04-29 DIAGNOSIS — M79642 Pain in left hand: Secondary | ICD-10-CM | POA: Diagnosis not present

## 2020-04-29 DIAGNOSIS — M3500 Sicca syndrome, unspecified: Secondary | ICD-10-CM | POA: Diagnosis not present

## 2020-04-29 DIAGNOSIS — M545 Low back pain, unspecified: Secondary | ICD-10-CM | POA: Diagnosis not present

## 2020-04-29 DIAGNOSIS — M549 Dorsalgia, unspecified: Secondary | ICD-10-CM | POA: Diagnosis not present

## 2020-04-29 DIAGNOSIS — M47816 Spondylosis without myelopathy or radiculopathy, lumbar region: Secondary | ICD-10-CM | POA: Diagnosis not present

## 2020-04-29 DIAGNOSIS — M810 Age-related osteoporosis without current pathological fracture: Secondary | ICD-10-CM | POA: Diagnosis not present

## 2020-04-29 DIAGNOSIS — M19042 Primary osteoarthritis, left hand: Secondary | ICD-10-CM | POA: Diagnosis not present

## 2020-04-29 DIAGNOSIS — M79641 Pain in right hand: Secondary | ICD-10-CM | POA: Diagnosis not present

## 2020-04-29 DIAGNOSIS — M0579 Rheumatoid arthritis with rheumatoid factor of multiple sites without organ or systems involvement: Secondary | ICD-10-CM | POA: Diagnosis not present

## 2020-05-13 LAB — COLOGUARD: Cologuard: NEGATIVE

## 2020-05-15 ENCOUNTER — Ambulatory Visit (INDEPENDENT_AMBULATORY_CARE_PROVIDER_SITE_OTHER): Payer: PPO | Admitting: Family Medicine

## 2020-05-15 ENCOUNTER — Encounter: Payer: Self-pay | Admitting: Family Medicine

## 2020-05-15 ENCOUNTER — Other Ambulatory Visit: Payer: Self-pay

## 2020-05-15 VITALS — BP 124/72 | HR 80 | Temp 98.6°F | Wt 115.8 lb

## 2020-05-15 DIAGNOSIS — M898X1 Other specified disorders of bone, shoulder: Secondary | ICD-10-CM

## 2020-05-15 DIAGNOSIS — S20212A Contusion of left front wall of thorax, initial encounter: Secondary | ICD-10-CM
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
## 2020-05-15 NOTE — Patient Instructions (Signed)
Hematoma A hematoma is a collection of blood. A hematoma can happen:  Under the skin.  In an organ.  In a body space.  In a joint space.  In other tissues. The blood can thicken (clot) to form a lump that you can see and feel. The lump is often hard and may become sore and tender. The lump can be very small or very big. Most hematomas get better in a few days to weeks. However, some hematomas may be serious and need medical care. What are the causes? This condition is caused by:  An injury.  Blood that leaks under the skin.  Problems from surgeries.  Medical conditions that cause bleeding or bruising. What increases the risk? You are more likely to develop this condition if:  You are an older adult.  You use medicines that thin your blood. What are the signs or symptoms? Symptoms depend on where the hematoma is in your body.  If the hematoma is under the skin, there is: ? A firm lump on the body. ? Pain and tenderness in the area. ? Bruising. The skin above the lump may be blue, dark blue, purple-red, or yellowish.  If the hematoma is deep in the tissues or body spaces, there may be: ? Blood in the stomach. This may cause pain in the belly (abdomen), weakness, passing out (fainting), and shortness of breath. ? Blood in the head. This may cause a headache, weakness, trouble speaking or understanding speech, or passing out.   How is this diagnosed? This condition is diagnosed based on:  Your medical history.  A physical exam.  Imaging tests, such as ultrasound or CT scan.  Blood tests. How is this treated? Treatment depends on the cause, size, and location of the hematoma. Treatment may include:  Doing nothing. Many hematomas go away on their own without treatment.  Surgery or close monitoring. This may be needed for large hematomas or hematomas that affect the body's organs.  Medicines. These may be given if a medical condition caused the hematoma. Follow  these instructions at home: Managing pain, stiffness, and swelling  If told, put ice on the area. ? Put ice in a plastic bag. ? Place a towel between your skin and the bag. ? Leave the ice on for 20 minutes, 2-3 times a day for the first two days.  If told, put heat on the affected area after putting ice on the area for two days. Use the heat source that your doctor tells you to use. This could be a moist heat pack or a heating pad. To do this: ? Place a towel between your skin and the heat source. ? Leave the heat on for 20-30 minutes. ? Remove the heat if your skin turns bright red. This is very important if you are unable to feel pain, heat, or cold. You may have a greater risk of getting burned.  Raise (elevate) the affected area above the level of your heart while you are sitting or lying down.  Wrap the affected area with an elastic bandage, if told by your doctor. Do not wrap the bandage too tightly.  If your hematoma is on a leg or foot and is painful, your doctor may give you crutches. Use them as told by your doctor.   General instructions  Take over-the-counter and prescription medicines only as told by your doctor.  Keep all follow-up visits as told by your doctor. This is important. Contact a doctor if:  You   have a fever.  The swelling or bruising gets worse.  You start to get more hematomas. Get help right away if:  Your pain gets worse.  Your pain is not getting better with medicine.  Your skin over the hematoma breaks or starts to bleed.  Your hematoma is in your chest or belly and you: ? Pass out. ? Feel weak. ? Become short of breath.  You have a hematoma on your scalp that is caused by a fall or injury, and you: ? Have a headache that gets worse. ? Have trouble speaking or understanding speech. ? Become less alert or you pass out. Summary  A hematoma is a collection of blood in any part of your body.  Most hematomas get better on their own in a  few days to weeks. Some may need medical care.  Follow instructions from your doctor about how to care for your hematoma.  Contact a doctor if the swelling or bruising gets worse, or if you are short of breath. This information is not intended to replace advice given to you by your health care provider. Make sure you discuss any questions you have with your health care provider. Document Revised: 06/23/2017 Document Reviewed: 06/23/2017 Elsevier Patient Education  2021 [employer]  

## 2020-05-16 DIAGNOSIS — M5459 Other low back pain: Secondary | ICD-10-CM | POA: Diagnosis not present

## 2020-05-16 DIAGNOSIS — R262 Difficulty in walking, not elsewhere classified: Secondary | ICD-10-CM | POA: Diagnosis not present

## 2020-05-16 DIAGNOSIS — R531 Weakness: Secondary | ICD-10-CM | POA: Diagnosis not present

## 2020-05-16 DIAGNOSIS — R293 Abnormal posture: Secondary | ICD-10-CM | POA: Diagnosis not present

## 2020-05-19 DIAGNOSIS — M5459 Other low back pain: Secondary | ICD-10-CM | POA: Diagnosis not present

## 2020-05-19 DIAGNOSIS — R262 Difficulty in walking, not elsewhere classified: Secondary | ICD-10-CM | POA: Diagnosis not present

## 2020-05-19 DIAGNOSIS — R531 Weakness: Secondary | ICD-10-CM | POA: Diagnosis not present

## 2020-05-19 DIAGNOSIS — R293 Abnormal posture: Secondary | ICD-10-CM | POA: Diagnosis not present

## 2020-05-20 DIAGNOSIS — R5383 Other fatigue: Secondary | ICD-10-CM | POA: Diagnosis not present

## 2020-05-20 DIAGNOSIS — Z20822 Contact with and (suspected) exposure to covid-19: Secondary | ICD-10-CM | POA: Diagnosis not present

## 2020-05-20 DIAGNOSIS — J3489 Other specified disorders of nose and nasal sinuses: Secondary | ICD-10-CM | POA: Diagnosis not present

## 2020-05-21 ENCOUNTER — Telehealth: Payer: Self-pay | Admitting: Family Medicine

## 2020-05-21 NOTE — Telephone Encounter (Signed)
Pt call and stated she need a letter stated that she don't need to be tested when she come back [place]  the country and need it with in 24 hours pt want a call back.

## 2020-05-22 NOTE — Telephone Encounter (Signed)
Patient informed of the message below.

## 2020-05-29 LAB — COLOGUARD

## 2020-06-02 DIAGNOSIS — M5459 Other low back pain: Secondary | ICD-10-CM | POA: Diagnosis not present

## 2020-06-02 DIAGNOSIS — R531 Weakness: Secondary | ICD-10-CM | POA: Diagnosis not present

## 2020-06-02 DIAGNOSIS — R293 Abnormal posture: Secondary | ICD-10-CM | POA: Diagnosis not present

## 2020-06-02 DIAGNOSIS — R262 Difficulty in walking, not elsewhere classified: Secondary | ICD-10-CM | POA: Diagnosis not present

## 2020-06-04 DIAGNOSIS — R262 Difficulty in walking, not elsewhere classified: Secondary | ICD-10-CM | POA: Diagnosis not present

## 2020-06-04 DIAGNOSIS — M5459 Other low back pain: Secondary | ICD-10-CM | POA: Diagnosis not present

## 2020-06-04 DIAGNOSIS — R293 Abnormal posture: Secondary | ICD-10-CM | POA: Diagnosis not present

## 2020-06-04 DIAGNOSIS — R531 Weakness: Secondary | ICD-10-CM | POA: Diagnosis not present

## 2020-06-09 DIAGNOSIS — R531 Weakness: Secondary | ICD-10-CM | POA: Diagnosis not present

## 2020-06-09 DIAGNOSIS — M5459 Other low back pain: Secondary | ICD-10-CM | POA: Diagnosis not present

## 2020-06-09 DIAGNOSIS — R262 Difficulty in walking, not elsewhere classified: Secondary | ICD-10-CM | POA: Diagnosis not present

## 2020-06-09 DIAGNOSIS — R293 Abnormal posture: Secondary | ICD-10-CM | POA: Diagnosis not present

## 2020-06-13 DIAGNOSIS — R262 Difficulty in walking, not elsewhere classified: Secondary | ICD-10-CM | POA: Diagnosis not present

## 2020-06-13 DIAGNOSIS — M5459 Other low back pain: Secondary | ICD-10-CM | POA: Diagnosis not present

## 2020-06-13 DIAGNOSIS — R531 Weakness: Secondary | ICD-10-CM | POA: Diagnosis not present

## 2020-06-13 DIAGNOSIS — R293 Abnormal posture: Secondary | ICD-10-CM | POA: Diagnosis not present

## 2020-06-17 DIAGNOSIS — R293 Abnormal posture: Secondary | ICD-10-CM | POA: Diagnosis not present

## 2020-06-17 DIAGNOSIS — M5459 Other low back pain: Secondary | ICD-10-CM | POA: Diagnosis not present

## 2020-06-17 DIAGNOSIS — R262 Difficulty in walking, not elsewhere classified: Secondary | ICD-10-CM | POA: Diagnosis not present

## 2020-06-17 DIAGNOSIS — R531 Weakness: Secondary | ICD-10-CM | POA: Diagnosis not present

## 2020-06-18 ENCOUNTER — Ambulatory Visit: Payer: PPO | Attending: Obstetrics & Gynecology | Admitting: Physical Therapy

## 2020-06-18 ENCOUNTER — Other Ambulatory Visit: Payer: Self-pay

## 2020-06-18 ENCOUNTER — Encounter: Payer: Self-pay | Admitting: Physical Therapy

## 2020-06-18 DIAGNOSIS — R278 Other lack of coordination: Secondary | ICD-10-CM

## 2020-06-18 DIAGNOSIS — N393 Stress incontinence (female) (male): Secondary | ICD-10-CM

## 2020-06-18 DIAGNOSIS — M62838 Other muscle spasm: Secondary | ICD-10-CM | POA: Insufficient documentation

## 2020-06-18 DIAGNOSIS — M6281 Muscle weakness (generalized): Secondary | ICD-10-CM

## 2020-06-18 NOTE — Therapy (Signed)
[facility] [address] [place], [place], [address] Phone: [phone]   Fax:  [phone]  Physical Therapy Evaluation  Patient Details  Name: Andrea Fields MRN: [id number] Date of Birth: 1955/11/14 Referring Provider (PT): Dr. Elsie Lincoln   Encounter Date: 06/18/2020   PT End of Session - 06/18/20 1247    Visit Number 1    Date for PT Re-Evaluation 09/10/20    Authorization Type [employer]    PT Start Time 1145    PT Stop Time 1230    PT Time Calculation (min) 45 min    Activity Tolerance Patient tolerated treatment well    Behavior During Therapy [facility] for tasks assessed/performed           Past Medical History:  Diagnosis Date  . Anemia    pt does not recall having this, CBC 05/2015 normal  . Anxiety and depression 07/03/2015   -sees psychiatry, [facility]   . Chronic low back pain 05/13/2014  . DCIS (ductal carcinoma in situ)    1992 dx during pregnancy, s/p lumpectomy remotely and no further treatment; benign biopsy in 2002  . Left ankle sprain    w/ lat mal avulsion fx in 2017; saw Dr. Victorino Dike  . Osteoporosis 07/03/2015   -seeing Dr. Kathi Ludwig -did not tolerate boniva   . Rheumatoid arthritis(714.0)    Dx in 2017, seeing Dr. Kathi Ludwig, Rheumatologist  . Sjogren's syndrome [facility])    Sees Rheumatologist    Past Surgical History:  Procedure Laterality Date  . ABCESS DRAINAGE  2012   sinus  . BREAST BIOPSY    . BREAST LUMPECTOMY Right 1992  . BREAST SURGERY  1992, 2002   biopsy  . LAPAROSCOPY  1990  . laparoscopy    . MOUTH RANULA EXCISION  1996  . ORIF FOOT FRACTURE  2007   left 5th metatarsal    There were no vitals filed for this visit.    Subjective Assessment - 06/18/20 1153    Subjective Presently having therapy for back and neck. Patient has been leaking for 5 years with sneezing and coughing with thin pad. somedays will go 3 times or not go for 3-4 days. Type 2- Type 5. Does not  always completely empty her bowels. History of hemorroids.    Patient Stated Goals reduce leakage    Currently in Pain? No/denies    Multiple Pain Sites No              [facility] PT Assessment - 06/18/20 0001      Assessment   Medical Diagnosis R10.2 Pelvic pain in female    Referring Provider (PT) Dr. Elsie Lincoln    Onset Date/Surgical Date --   15 years     Precautions   Precautions None      Restrictions   Weight Bearing Restrictions No      Balance Screen   Has the patient fallen in the past 6 months No    Has the patient had a decrease in activity level because of a fear of falling?  No    Is the patient reluctant to leave their home because of a fear of falling?  No      Home Tourist information centre manager residence      Prior Function   Level of Independence Independent    Vocation Retired    Leisure hiking, gardening, cooking      Cognition   Overall Cognitive Status Within  Functional Limits for tasks assessed      Posture/Postural Control   Posture/Postural Control No significant limitations      ROM / Strength   AROM / PROM / Strength AROM;PROM;Strength      PROM   Overall PROM Comments bilateral hip ROM is full      Strength   Right Hip Extension 4/5    Right Hip ABduction 5/5    Right Hip ADduction 4/5    Left Hip ABduction 5/5    Left Hip ADduction 4/5      Palpation   SI assessment  ASIS are equal    Palpation comment decreased lower rib cage movement, tightness in the hip adductors                      Objective measurements completed on examination: See above findings.     Pelvic Floor Special Questions - 06/18/20 0001    Prior Pregnancies Yes    Number of Pregnancies 1    Number of Vaginal Deliveries 1    Currently Sexually Active No    Urinary Leakage Yes    Pad use 2 during the day; 1 during the night for just in case    Activities that cause leaking Sneezing;Coughing;Laughing;Walking    Urinary frequency  urine flow is not as strong, will lean forward to assist    Fecal incontinence No   constipation   Skin Integrity Intact    Pelvic Floor Internal Exam Patient sonfirms identification and approves PT to assess pelvic and treat    Exam Type Vaginal    Palpation tenderness located in bilateral obturator internist, levator ani, perineal body    Strength fair squeeze, definite lift   circular   Strength # of seconds 7   trouble relaxing pelvic floor with quick flick   Tone increased                    PT Education - 06/18/20 1318    Education Details educated pateint on vaginal moisturizers    Person(s) Educated Patient    Methods Explanation;Handout    Comprehension Verbalized understanding            PT Short Term Goals - 06/18/20 1326      PT SHORT TERM GOAL #1   Title independent with initial HEP    Time 4    Period Weeks    Status New    Target Date 07/16/20      PT SHORT TERM GOAL #2   Title educated on vaginal health to improve vaginal dryness    Time 4    Period Weeks    Status New    Target Date 07/16/20             PT Long Term Goals - 06/18/20 1405      PT LONG TERM GOAL #1   Title independent with advanced HEP for pelvic floor,  core and hip strength    Time 12    Period Weeks    Status New    Target Date 09/10/20      PT LONG TERM GOAL #2   Title urinary leakage with coughing, sneezing decreased to minimal to no leakage due to improved coordination of the pelvic floor    Time 12    Period Weeks    Status New    Target Date 09/10/20      PT LONG TERM GOAL #3   Title Patient  is able to relax her pelvic floor to fully empty her bowels and improve her urine stream    Time 12    Period Weeks    Status New    Target Date 09/10/20      PT LONG TERM GOAL #4   Title patient is able to return to hiking without worrying about urinary leakage    Time 12    Period Weeks    Status New    Target Date 09/10/20                  Plan  - 06/18/20 1322    Clinical Impression Statement Patient is a 65 year old female with stress incontinence for the past 15 years. She will leak with sneezing, coughing, and laughing. She will wear 2 pads during the day and one at night. She wears the nighttime pad just incase. Bilateral hip weakness. Pelvic floor strength is 3/5 with weak lift. She has tenderness located in bilateral levator ani and obturator internist. Patient has dryness in the vulvar area with redness. Her urine stream is weak. Patient has difficulty with constipation and may not go for 3-4 days or several time per day. She does not fully empty her bowels. Her stool is Type 2-6 depending on the day. Patient has tight hip adductors. She does not move her lower rib cage with diaphragmatic breathing. Patient will benefit from skilled therapy to improve pelvic floor coordination and reduce her leakage and constipation.    Personal Factors and Comorbidities Comorbidity 3+;Age    Comorbidities RA; Sjorens Syndrome; Osteoporosis; CCIS    Examination-Activity Limitations Continence;Toileting    Stability/Clinical Decision Making Stable/Uncomplicated    Clinical Decision Making Low    Rehab Potential Excellent    PT Frequency 1x / week    PT Duration 12 weeks    PT Treatment/Interventions ADLs/Self Care Home Management;Biofeedback;Neuromuscular re-education;Therapeutic exercise;Therapeutic activities;Patient/family education;Manual techniques    PT Next Visit Plan review education on vaginal health, hip stretches to stretch the pelvic floor, diaphragmatic breathing to relax the pelvic floor; educated on pelvic floor manual work externally; toileting    Consulted and Agree with Plan of Care Patient           Patient will benefit from skilled therapeutic intervention in order to improve the following deficits and impairments:  Decreased coordination,Increased fascial restricitons,Decreased endurance,Decreased activity  tolerance,Pain,Decreased mobility,Decreased strength  Visit Diagnosis: Muscle weakness (generalized)  Other lack of coordination  Other muscle spasm  Stress incontinence (female) (female)     Problem List Patient Active Problem List   Diagnosis Date Noted  . Fibromyalgia 04/14/2020  . Vitamin D deficiency 04/14/2020  . Poor sleep 05/17/2016  . Osteoporosis 07/03/2015  . Anxiety and depression 07/03/2015  . Rotator cuff syndrome of right shoulder 06/13/2014  . Chronic low back pain 05/13/2014  . Rheumatoid arthritis (HCC) 05/13/2014  . Sjogren's disease (HCC) 12/08/2011  . History of open reduction and internal fixation (ORIF) procedure 05/02/2005  . DCIS (ductal carcinoma in situ) of breast 11/02/1990    Eulis Foster, PT 06/18/20 2:08 PM   [facility] [address], [address] [place], [place], [address] Phone: [phone]   Fax:  [phone]  Name: HOKULANI ROGEL MRN: [id number] Date of Birth: [birth year]

## 2020-06-18 NOTE — Patient Instructions (Addendum)
Moisturizers . They are used in the vagina to hydrate the mucous membrane that make up the vaginal canal. . Designed to keep a more normal acid balance (ph) . Once placed in the vagina, it will last between two to three days.  . Use 2-3 times per week at bedtime  . Ingredients to avoid is glycerin and fragrance, can increase chance of infection . Should not be used just before sex due to causing irritation . Most are gels administered either in a tampon-shaped applicator or as a vaginal suppository. They are non-hormonal.   Types of Moisturizers(internal use)  . Vitamin E vaginal suppositories- Whole foods, Amazon . Moist Again . Coconut oil- can break down condoms . Julva- (Do no use if on Tamoxifen) amazon . Yes moisturizer- amazon . NeuEve Silk , NeuEve Silver for menopausal or over 65 (if have severe vaginal atrophy or cancer treatments use NeuEve Silk for  1 month than move to [employer])- [employer], ShapeConsultant.com.cy . Olive and Bee intimate cream- www.oliveandbee.com.au . Mae vaginal moisturizer- Amazon . Aloe .    [facility] [address] [place], [place] [address] Phone # [phone] Fax [phone]   Creams to use externally on the Vulva area  [facility] Releveum (good for for cancer patients that had radiation to the area)- [place] or [employer].https://garcia-valdez.org/  V-magic cream - amazon  Julva-amazon  Vital "V Wild Yam salve ( help moisturize and help with thinning vulvar area, does have Beeswax  MoodMaid Botanical Pro-Meno Wild Yam Cream- Amazon  Desert Harvest Gele  Cleo by Zane Herald labial moisturizer (Amazon,   Coconut or olive oil  aloe   Things to avoid in the vaginal area . Do not use things to irritate the vulvar area . No lotions just specialized creams for the vulva area- Neogyn, V-magic, No soaps; can use Aveeno or Calendula cleanser if needed. Must be gentle . No deodorants . No douches . Good to sleep without underwear to  let the vaginal area to air out . No scrubbing: spread the lips to let warm water rinse over labias and pat dry  [facility] [address], [address] [place], [place] [address] Phone # [phone] Fax [phone]

## 2020-06-19 DIAGNOSIS — R531 Weakness: Secondary | ICD-10-CM | POA: Diagnosis not present

## 2020-06-19 DIAGNOSIS — M5459 Other low back pain: Secondary | ICD-10-CM | POA: Diagnosis not present

## 2020-06-19 DIAGNOSIS — R262 Difficulty in walking, not elsewhere classified: Secondary | ICD-10-CM | POA: Diagnosis not present

## 2020-06-19 DIAGNOSIS — R293 Abnormal posture: Secondary | ICD-10-CM | POA: Diagnosis not present

## 2020-06-24 DIAGNOSIS — R262 Difficulty in walking, not elsewhere classified: Secondary | ICD-10-CM | POA: Diagnosis not present

## 2020-06-24 DIAGNOSIS — M5459 Other low back pain: Secondary | ICD-10-CM | POA: Diagnosis not present

## 2020-06-24 DIAGNOSIS — R531 Weakness: Secondary | ICD-10-CM | POA: Diagnosis not present

## 2020-06-24 DIAGNOSIS — R293 Abnormal posture: Secondary | ICD-10-CM | POA: Diagnosis not present

## 2020-06-26 DIAGNOSIS — M5459 Other low back pain: Secondary | ICD-10-CM | POA: Diagnosis not present

## 2020-06-26 DIAGNOSIS — R293 Abnormal posture: Secondary | ICD-10-CM | POA: Diagnosis not present

## 2020-06-26 DIAGNOSIS — R262 Difficulty in walking, not elsewhere classified: Secondary | ICD-10-CM | POA: Diagnosis not present

## 2020-06-26 DIAGNOSIS — R531 Weakness: Secondary | ICD-10-CM | POA: Diagnosis not present

## 2020-07-08 DIAGNOSIS — M5459 Other low back pain: Secondary | ICD-10-CM | POA: Diagnosis not present

## 2020-07-08 DIAGNOSIS — R293 Abnormal posture: Secondary | ICD-10-CM | POA: Diagnosis not present

## 2020-07-08 DIAGNOSIS — R262 Difficulty in walking, not elsewhere classified: Secondary | ICD-10-CM | POA: Diagnosis not present

## 2020-07-08 DIAGNOSIS — R531 Weakness: Secondary | ICD-10-CM | POA: Diagnosis not present

## 2020-07-10 DIAGNOSIS — R293 Abnormal posture: Secondary | ICD-10-CM | POA: Diagnosis not present

## 2020-07-10 DIAGNOSIS — R531 Weakness: Secondary | ICD-10-CM | POA: Diagnosis not present

## 2020-07-10 DIAGNOSIS — R262 Difficulty in walking, not elsewhere classified: Secondary | ICD-10-CM | POA: Diagnosis not present

## 2020-07-10 DIAGNOSIS — M5459 Other low back pain: Secondary | ICD-10-CM | POA: Diagnosis not present

## 2020-07-15 DIAGNOSIS — R531 Weakness: Secondary | ICD-10-CM | POA: Diagnosis not present

## 2020-07-15 DIAGNOSIS — R293 Abnormal posture: Secondary | ICD-10-CM | POA: Diagnosis not present

## 2020-07-15 DIAGNOSIS — R262 Difficulty in walking, not elsewhere classified: Secondary | ICD-10-CM | POA: Diagnosis not present

## 2020-07-15 DIAGNOSIS — M5459 Other low back pain: Secondary | ICD-10-CM | POA: Diagnosis not present

## 2020-07-17 DIAGNOSIS — R262 Difficulty in walking, not elsewhere classified: Secondary | ICD-10-CM | POA: Diagnosis not present

## 2020-07-17 DIAGNOSIS — R531 Weakness: Secondary | ICD-10-CM | POA: Diagnosis not present

## 2020-07-17 DIAGNOSIS — M5459 Other low back pain: Secondary | ICD-10-CM | POA: Diagnosis not present

## 2020-07-17 DIAGNOSIS — R293 Abnormal posture: Secondary | ICD-10-CM | POA: Diagnosis not present

## 2020-07-22 DIAGNOSIS — R262 Difficulty in walking, not elsewhere classified: Secondary | ICD-10-CM | POA: Diagnosis not present

## 2020-07-22 DIAGNOSIS — M5459 Other low back pain: Secondary | ICD-10-CM | POA: Diagnosis not present

## 2020-07-22 DIAGNOSIS — R293 Abnormal posture: Secondary | ICD-10-CM | POA: Diagnosis not present

## 2020-07-22 DIAGNOSIS — R531 Weakness: Secondary | ICD-10-CM | POA: Diagnosis not present

## 2020-07-29 DIAGNOSIS — M5459 Other low back pain: Secondary | ICD-10-CM | POA: Diagnosis not present

## 2020-07-29 DIAGNOSIS — R293 Abnormal posture: Secondary | ICD-10-CM | POA: Diagnosis not present

## 2020-07-29 DIAGNOSIS — R262 Difficulty in walking, not elsewhere classified: Secondary | ICD-10-CM | POA: Diagnosis not present

## 2020-07-29 DIAGNOSIS — R531 Weakness: Secondary | ICD-10-CM | POA: Diagnosis not present

## 2020-08-13 ENCOUNTER — Ambulatory Visit: Payer: PPO | Admitting: Physical Therapy

## 2020-08-16 IMAGING — MG DIGITAL SCREENING BILAT W/ TOMO W/ CAD
8 series · 9 of 24 positions shown · non-contrast
Comparison: Previous exam(s).

CLINICAL DATA: Screening.

EXAM:
DIGITAL SCREENING BILATERAL MAMMOGRAM WITH TOMO AND CAD

[L CC synth-2D]
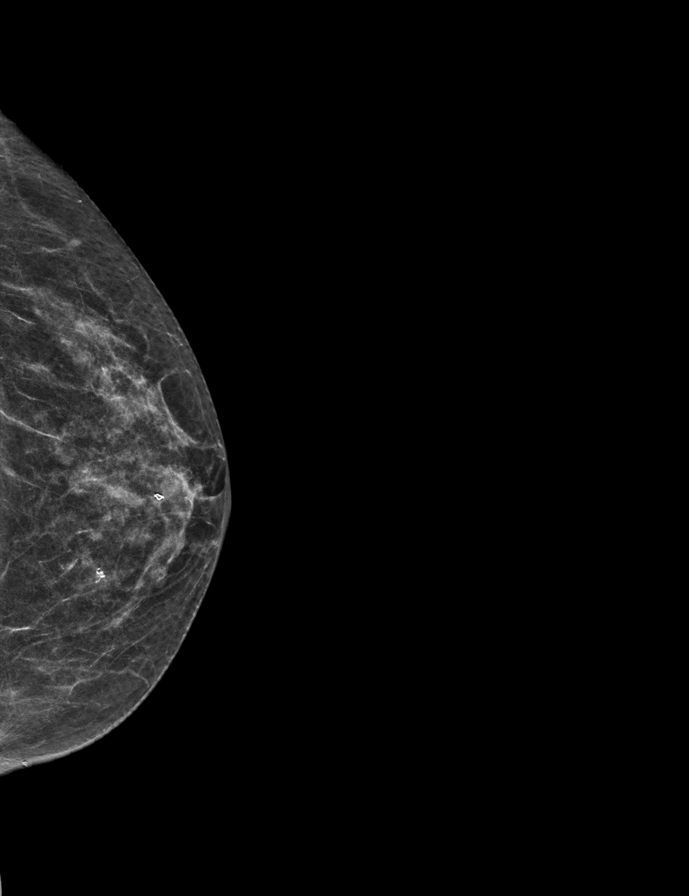

[R CC synth-2D]
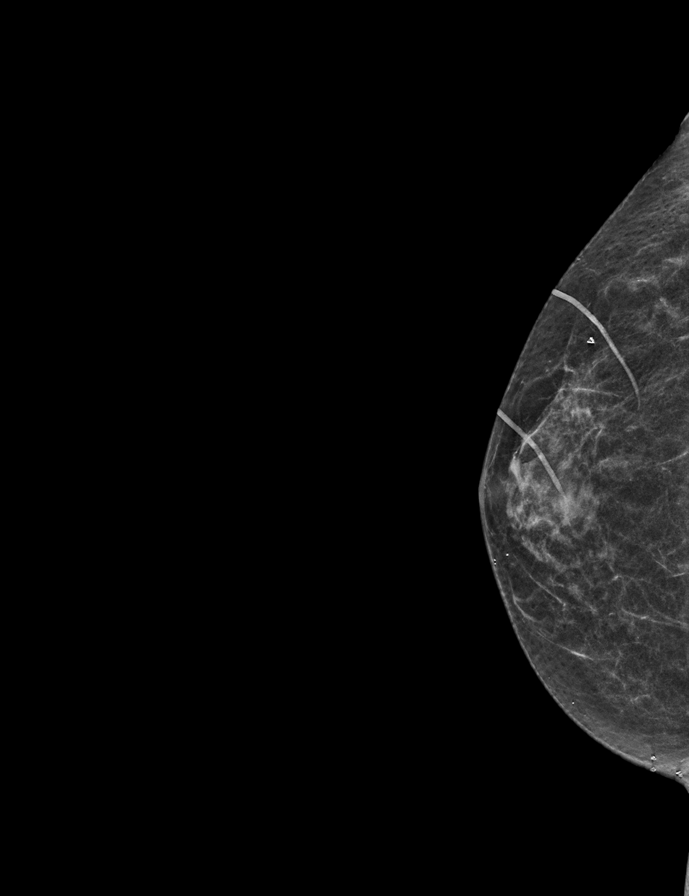

[R MLO synth-2D]
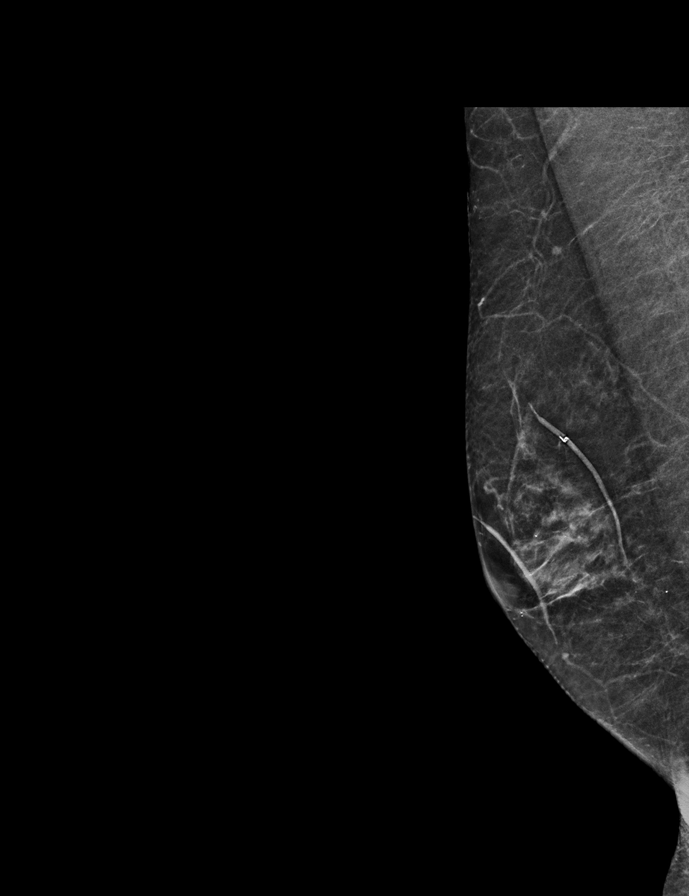

[L MLO synth-2D]
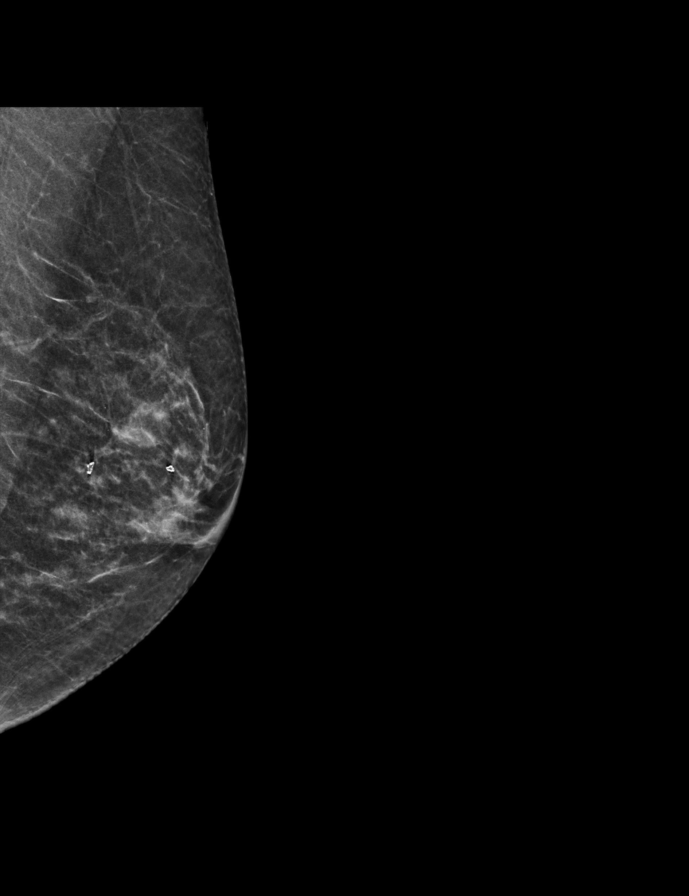

[R MLO tomo · 2 of 43 frames shown]
[frame 14/43]
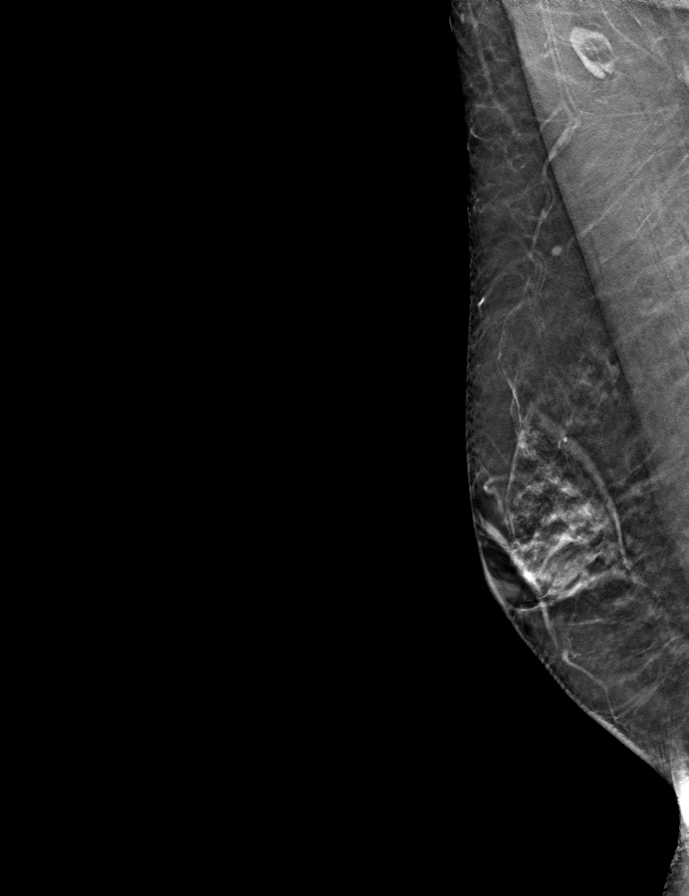
[frame 22/43]
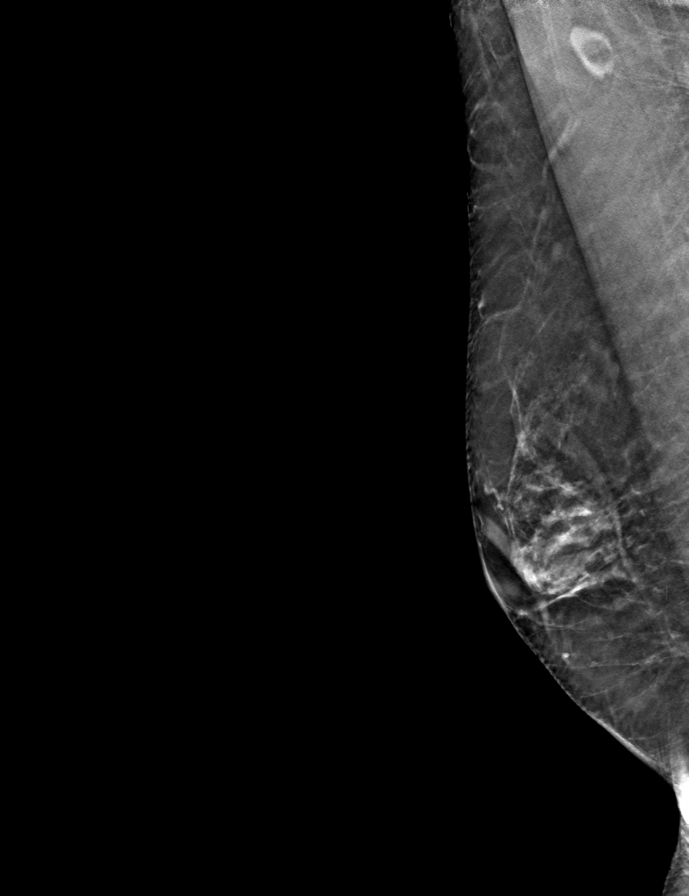

[L CC tomo · tomo slice 23/46.0]
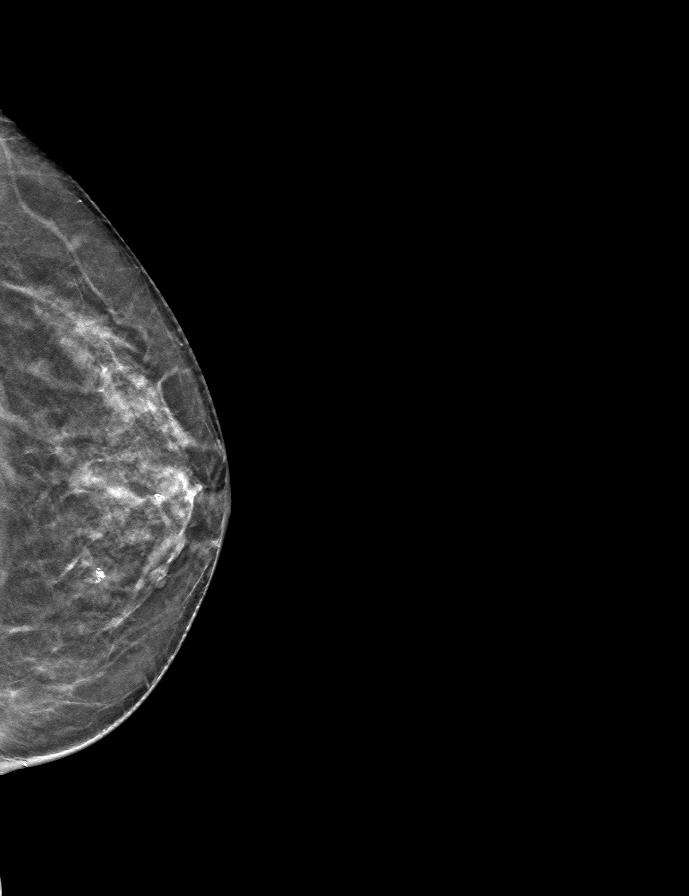

[L MLO tomo · tomo slice 23/45.0]
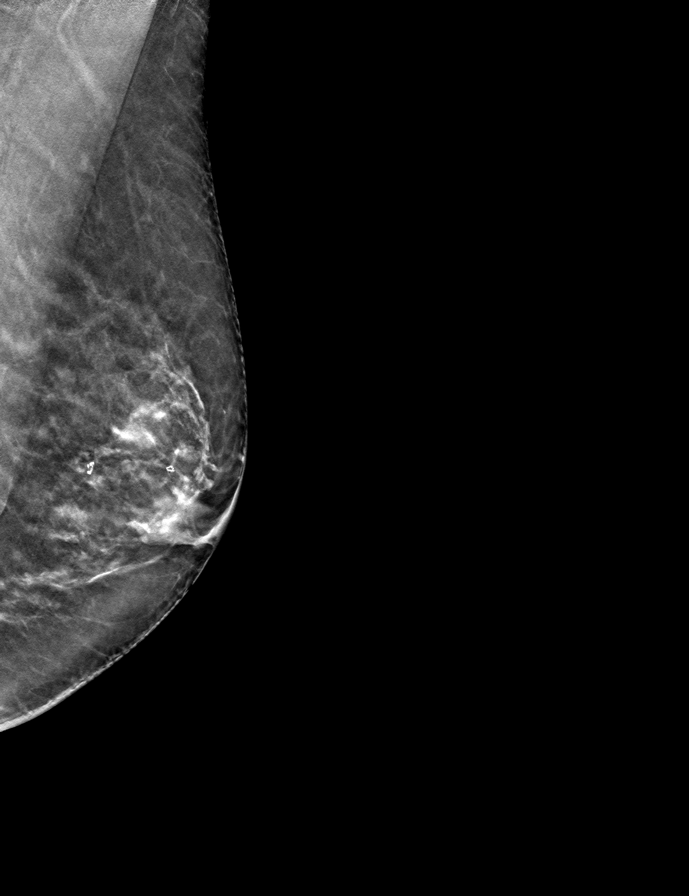

[R CC tomo · tomo slice 22/43.0]
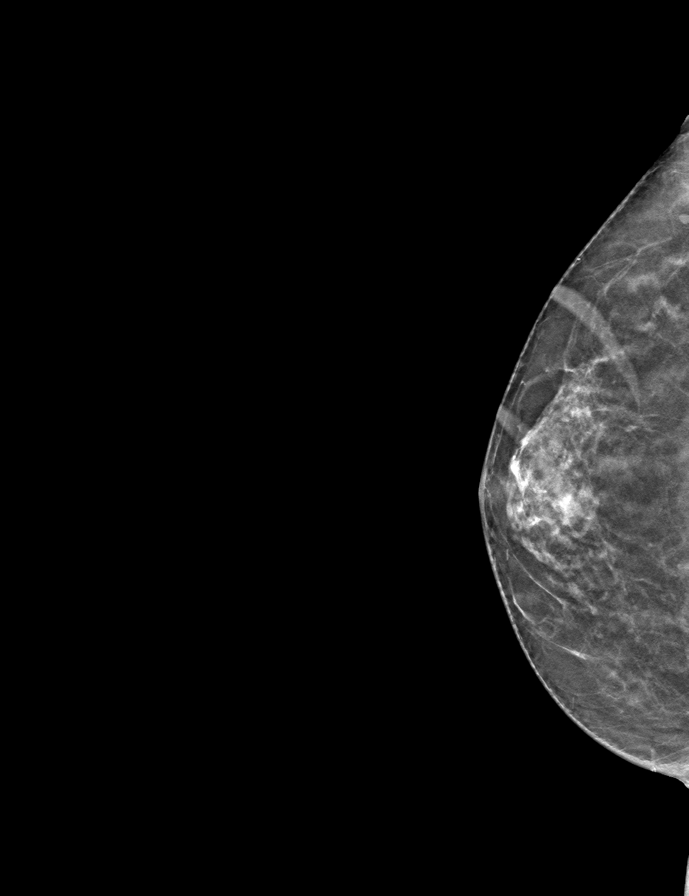

[9 of 24 positions shown; findings below may reference images not displayed]

ACR Breast Density Category c: The breast tissue is heterogeneously
dense, which may obscure small masses.
FINDINGS: There are no findings suspicious for malignancy. Images were
processed with CAD.
IMPRESSION: No mammographic evidence of malignancy. A result letter of this
screening mammogram will be mailed directly to the patient.

RECOMMENDATION:
Screening mammogram in one year. (Code:[id number])

BI-RADS CATEGORY  1: Negative.

## 2020-08-18 ENCOUNTER — Emergency Department
Admission: RE | Admit: 2020-08-18 | Discharge: 2020-08-18 | Disposition: A | Discharge status: Home / Self Care | Payer: PPO | Source: Ambulatory Visit | Attending: Family Medicine | Admitting: Family Medicine

## 2020-08-18 ENCOUNTER — Emergency Department (INDEPENDENT_AMBULATORY_CARE_PROVIDER_SITE_OTHER): Payer: PPO

## 2020-08-18 ENCOUNTER — Other Ambulatory Visit: Payer: Self-pay

## 2020-08-18 VITALS — BP 121/77 | HR 75 | Temp 98.5°F | Resp 16 | Ht 61.5 in | Wt 116.0 lb

## 2020-08-18 DIAGNOSIS — J449 Chronic obstructive pulmonary disease, unspecified: Secondary | ICD-10-CM

## 2020-08-18 DIAGNOSIS — J069 Acute upper respiratory infection, unspecified: Secondary | ICD-10-CM | POA: Diagnosis not present

## 2020-08-18 DIAGNOSIS — R059 Cough, unspecified: Secondary | ICD-10-CM | POA: Diagnosis not present

## 2020-08-18 DIAGNOSIS — R0989 Other specified symptoms and signs involving the circulatory and respiratory systems: Secondary | ICD-10-CM

## 2020-08-18 MED ORDER — BENZONATATE 100 MG PO CAPS: 100.0000 mg | ORAL_CAPSULE | Freq: Three times a day (TID) | ORAL | 0 refills | Status: DC

## 2020-08-18 MED ORDER — BENZONATATE 200 MG PO CAPS: 200.0000 mg | ORAL_CAPSULE | Freq: Three times a day (TID) | ORAL | 0 refills | Status: DC

## 2020-08-18 MED ORDER — AMOXICILLIN-POT CLAVULANATE 875-125 MG PO TABS: 1.0000 | ORAL_TABLET | Freq: Two times a day (BID) | ORAL | 0 refills | Status: DC

## 2020-08-18 MED ORDER — PREDNISONE 20 MG PO TABS: 20.0000 mg | ORAL_TABLET | Freq: Two times a day (BID) | ORAL | 0 refills | Status: DC

## 2020-08-18 NOTE — ED Provider Notes (Signed)
[facility]    CSN: [id number] Arrival date & time: 08/18/20  1645      History   Chief Complaint Chief Complaint  Patient presents with   Cough   Nasal Congestion    HPI Andrea Fields is a 65 y.o. female.   HPI  Patient has had a cough and congestion has been going on for 11 to 12 days.  She states that she has had COVID vaccination.  No known COVID exposures.  She did COVID testing at home and this was negative.  She states that she has coughed so much that her chest hurts.  She has pain in her right lower chest wall.  Pain with deep breath and with coughing.  She does have some fatigue.  No real sputum production.  She states that she has no known lung disease, COPD or emphysema, but she is a longstanding smoker.  Currently only smokes 4 cigarettes a day. She has known rheumatoid arthritis and Sjogren's syndrome, osteoporosis, and fibromyalgia.  Past Medical History:  Diagnosis Date   Anemia    pt does not recall having this, CBC 05/2015 normal   Anxiety and depression 07/03/2015   -sees psychiatry, [facility]    Chronic low back pain 05/13/2014   DCIS (ductal carcinoma in situ)    1992 dx during pregnancy, s/p lumpectomy remotely and no further treatment; benign biopsy in 2002   Left ankle sprain    w/ lat mal avulsion fx in 2017; saw Dr. Victorino Dike   Osteoporosis 07/03/2015   -seeing Dr. Kathi Ludwig -did not tolerate boniva    Rheumatoid arthritis(714.0)    Dx in 2017, seeing Dr. Kathi Ludwig, Rheumatologist   Sjogren's syndrome [facility])    Sees Rheumatologist    Patient Active Problem List   Diagnosis Date Noted   Fibromyalgia 04/14/2020   Vitamin D deficiency 04/14/2020   Poor sleep 05/17/2016   Osteoporosis 07/03/2015   Anxiety and depression 07/03/2015   Rotator cuff syndrome of right shoulder 06/13/2014   Chronic low back pain 05/13/2014   Rheumatoid arthritis (HCC) 05/13/2014   Sjogren's disease (HCC) 12/08/2011   History of open reduction and internal fixation  (ORIF) procedure 05/02/2005   DCIS (ductal carcinoma in situ) of breast 11/02/1990    Past Surgical History:  Procedure Laterality Date   ABCESS DRAINAGE  2012   sinus   BREAST BIOPSY     BREAST LUMPECTOMY Right 1992   BREAST SURGERY  1992, 2002   biopsy   LAPAROSCOPY  1990   laparoscopy     MOUTH RANULA EXCISION  1996   ORIF FOOT FRACTURE  2007   left 5th metatarsal    OB History     Gravida  1   Para  1   Term  1   Preterm      AB      Living  1      SAB      IAB      Ectopic      Multiple      Live Births               Home Medications    Prior to Admission medications   Medication Sig Start Date End Date Taking? Authorizing Provider  amoxicillin-clavulanate (AUGMENTIN) 875-125 MG tablet Take 1 tablet by mouth every 12 (twelve) hours. 08/18/20  Yes Eustace Moore, MD  predniSONE (DELTASONE) 20 MG tablet Take 1 tablet (20 mg total) by mouth 2 (two) times daily  with a meal. 08/18/20  Yes Eustace Moore, MD  acetaminophen (TYLENOL) 500 MG tablet 2 tablets as needed    [provider]  Ascorbic Acid (VITAMIN C) 1000 MG tablet Take 1,000 mg by mouth daily.    [provider]  benzonatate (TESSALON) 200 MG capsule Take 1 capsule (200 mg total) by mouth every 8 (eight) hours. 08/18/20   Eustace Moore, MD  Cholecalciferol (VITAMIN D PO) Take 2,000 Units by mouth daily.     [provider]  cyanocobalamin 100 MCG tablet Take 100 mcg by mouth daily.    [provider]  denosumab (PROLIA) 60 MG/ML SOLN injection Inject 60 mg into the skin every 6 (six) months. Administer in upper arm, thigh, or abdomen    [provider]  DULoxetine (CYMBALTA) 60 MG capsule TAKE 1 CAPSULE BY MOUTH EVERY DAY 02/06/20   Koberlein, Paris Lore, MD  fish oil-omega-3 fatty acids 1000 MG capsule Take 2 g by mouth daily.    [provider]  hydroxychloroquine (PLAQUENIL) 200 MG tablet Take 1 tablet by mouth daily.     [provider]  Multiple Vitamin (MULTIVITAMIN) tablet Take 1 tablet by mouth daily.    [provider]  naproxen sodium (ANAPROX) 220 MG tablet Take 220 mg by mouth as needed.     [provider]  Probiotic Product (PROBIOTIC DAILY PO) Take 1 capsule by mouth daily.    [provider]    Family History Family History  Problem Relation Age of Onset   Hyperlipidemia Mother    Dementia Mother    Parkinson's disease Mother    Diabetes Mother    Hyperlipidemia Father    Heart block Father    Heart disease Father    Osteoarthritis Father    Other Father        optic ischemia   Hypertension Father    Benign prostatic hyperplasia Father    Cancer Maternal Aunt        breast   Dementia Maternal Grandmother    Osteoarthritis Maternal Grandmother    Osteoarthritis Maternal Grandfather    Diabetes Paternal Grandmother    Arthritis Paternal Grandfather    Heart disease Paternal Grandfather    Asthma Sister    Osteoarthritis Sister    Sleep apnea Brother    Other Sister        detached retina   Osteoarthritis Sister    Healthy Brother    Healthy Brother    Anxiety disorder Daughter     Social History Social History   Tobacco Use   Smoking status: Every Day    Packs/day: 0.25    Types: Cigarettes   Smokeless tobacco: Never  Substance Use Topics   Alcohol use: Yes    Alcohol/week: 7.0 - 10.0 standard drinks    Types: 7 - 10 Glasses of wine per week   Drug use: No     Allergies   Azithromycin, Boniva [ibandronic acid], Minocycline hcl, and Vectrin [minocycline hcl]   Review of Systems Review of Systems See HPI  Physical Exam Triage Vital Signs ED Triage Vitals  Enc Vitals Group     BP 08/18/20 1733 121/77     Pulse Rate 08/18/20 1733 75     Resp 08/18/20 1733 16     Temp 08/18/20 1733 98.5 F (36.9 C)     Temp Source 08/18/20 1733 Oral     SpO2 08/18/20 1733 96 %     Weight 08/18/20 1734  116 lb (52.6 kg)     Height  08/18/20 1734 5' 1.5" (1.562 m)     Head Circumference --      Peak Flow --      Pain Score 08/18/20 1734 0     Pain Loc --      Pain Edu? --      Excl. in GC? --    No data found.  Updated Vital Signs BP 121/77 (BP Location: Right Arm)   Pulse 75   Temp 98.5 F (36.9 C) (Oral)   Resp 16   Ht 5' 1.5" (1.562 m)   Wt 52.6 kg   SpO2 96%   BMI 21.56 kg/m      Physical Exam Constitutional:      General: She is not in acute distress.    Appearance: She is well-developed and normal weight.  HENT:     Head: Normocephalic and atraumatic.     Right Ear: Tympanic membrane and ear canal normal.     Left Ear: Tympanic membrane and ear canal normal.     Nose: Nose normal. No congestion.     Mouth/Throat:     Mouth: Mucous membranes are moist.     Pharynx: Posterior oropharyngeal erythema present.     Comments: Mild erythema posterior pharynx Eyes:     Conjunctiva/sclera: Conjunctivae normal.     Pupils: Pupils are equal, round, and reactive to light.  Cardiovascular:     Rate and Rhythm: Normal rate and regular rhythm.     Heart sounds: Normal heart sounds.  Pulmonary:     Effort: Pulmonary effort is normal. No respiratory distress.     Breath sounds: Rhonchi present.  Abdominal:     General: There is no distension.     Palpations: Abdomen is soft.  Musculoskeletal:        General: Normal range of motion.     Cervical back: Normal range of motion.  Lymphadenopathy:     Cervical: No cervical adenopathy.  Skin:    General: Skin is warm and dry.  Neurological:     General: No focal deficit present.     Mental Status: She is alert.  Psychiatric:        Mood and Affect: Mood normal.        Behavior: Behavior normal.     UC Treatments / Results  Labs (all labs ordered are listed, but only abnormal results are displayed) Labs Reviewed - No data to display  EKG   Radiology DG Chest 2 View  Result Date: 08/18/2020 CLINICAL DATA:  Cough, rhonchi EXAM: CHEST - 2 VIEW  COMPARISON:  04/27/2016 FINDINGS: The lungs appear mildly hyperinflated suggesting changes of underlying COPD, stable since prior examination. No superimposed focal pulmonary infiltrate. Mild biapical scarring. No pneumothorax or pleural effusion. Cardiac size within normal limits. Pulmonary vascularity is normal. No acute bone abnormality. IMPRESSION: No active cardiopulmonary disease.  COPD. Electronically Signed   By: Helyn Numbers MD   On: 08/18/2020 18:27    Procedures Procedures (including critical care time)  Medications Ordered in UC Medications - No data to display  Initial Impression / Assessment and Plan / UC Course  I have reviewed the triage vital signs and the nursing notes.  Pertinent labs & imaging results that were available during my care of the patient were reviewed by me and considered in my medical decision making (see chart for details).     No pneumonia on chest x-ray.  She still has an  upper respiratory infection that is persisting longer than I would expect a virus.  With her underlying COPD and shortness of breath and I will treat her with prednisone and antibiotics.  Tessalon for cough.  Push fluids.  Follow-up as discussed Final Clinical Impressions(s) / UC Diagnoses   Final diagnoses:  Acute upper respiratory infection  Chronic obstructive pulmonary disease, unspecified COPD type (HCC)     Discharge Instructions      Take Tessalon 2 times a day.  This is for cough Continue to drink lots of fluids Take prednisone twice a day.  This is for inflammation in the bronchial tubes. Take Augmentin 2 times a day.  This is a stronger antibiotic.  I recommend that you take Augmentin with food.  It is also a good idea to take probiotics while you are on an antibiotic Follow-up with your personal physician if not improving by the end of the week     ED Prescriptions     Medication Sig Dispense Auth. Provider   amoxicillin-clavulanate (AUGMENTIN) 875-125 MG  tablet Take 1 tablet by mouth every 12 (twelve) hours. 14 tablet Eustace Moore, MD   predniSONE (DELTASONE) 20 MG tablet Take 1 tablet (20 mg total) by mouth 2 (two) times daily with a meal. 10 tablet Eustace Moore, MD   benzonatate (TESSALON) 200 MG capsule Take 1 capsule (200 mg total) by mouth every 8 (eight) hours. 20 capsule Eustace Moore, MD      PDMP not reviewed this encounter.   Eustace Moore, MD 08/18/20 2100

## 2020-08-18 NOTE — ED Triage Notes (Signed)
Patient here with cough and congestion, malaise and this has been going on since about 08/07/20; had shingles and covid vacc on 7/5. She tested herself for covid 6 days ago and it was negative; she has had all covid vaccinations and boosters.

## 2020-08-18 NOTE — Discharge Instructions (Addendum)
Take Tessalon 2 times a day.  This is for cough Continue to drink lots of fluids Take prednisone twice a day.  This is for inflammation in the bronchial tubes. Take Augmentin 2 times a day.  This is a stronger antibiotic.  I recommend that you take Augmentin with food.  It is also a good idea to take probiotics while you are on an antibiotic Follow-up with your personal physician if not improving by the end of the week

## 2020-08-20 ENCOUNTER — Ambulatory Visit: Payer: PPO | Attending: Obstetrics & Gynecology | Admitting: Physical Therapy

## 2020-08-20 ENCOUNTER — Other Ambulatory Visit: Payer: Self-pay

## 2020-08-20 ENCOUNTER — Encounter: Payer: Self-pay | Admitting: Physical Therapy

## 2020-08-20 DIAGNOSIS — M62838 Other muscle spasm: Secondary | ICD-10-CM

## 2020-08-20 DIAGNOSIS — M6281 Muscle weakness (generalized): Secondary | ICD-10-CM | POA: Diagnosis not present

## 2020-08-20 DIAGNOSIS — N393 Stress incontinence (female) (male): Secondary | ICD-10-CM | POA: Insufficient documentation

## 2020-08-20 DIAGNOSIS — R278 Other lack of coordination: Secondary | ICD-10-CM | POA: Diagnosis not present

## 2020-08-20 NOTE — Therapy (Signed)
[facility] [address], [place], [place], [address] Phone: [phone]   Fax:  [phone]  Physical Therapy Treatment  Patient Details  Name: Andrea Fields MRN: [id number] Date of Birth: 02/08/1955 Referring Provider (PT): Dr. Silas [place]   Encounter Date: 08/20/2020   PT End of Session - 08/20/20 1255     Visit Number 2    Date for PT Re-Evaluation 09/10/20    Authorization Type [employer]    Authorization - Visit Number 2    Authorization - Number of Visits 10    PT Start Time 3500    PT Stop Time 1225    PT Time Calculation (min) 40 min    Activity Tolerance Patient tolerated treatment well    Behavior During Therapy [facility] for tasks assessed/performed             Past Medical History:  Diagnosis Date   Anemia    pt does not recall having this, CBC 05/2015 normal   Anxiety and depression 07/03/2015   -sees psychiatry, Metta Clines    Chronic low back pain 05/13/2014   DCIS (ductal carcinoma in situ)    1992 dx during pregnancy, s/p lumpectomy remotely and no further treatment; benign biopsy in 2002   Left ankle sprain    w/ lat mal avulsion fx in 2017; saw Dr. Doran Durand   Osteoporosis 07/03/2015   -seeing Dr. Dossie Der -did not tolerate boniva    Rheumatoid arthritis(714.0)    Dx in 2017, seeing Dr. Dossie Der, Rheumatologist   Sjogren's syndrome [facility])    Sees Rheumatologist    Past Surgical History:  Procedure Laterality Date   ABCESS DRAINAGE  2012   sinus   BREAST BIOPSY     BREAST LUMPECTOMY Right [place], 2002   biopsy   LAPAROSCOPY  1990   laparoscopy     [place]   ORIF FOOT FRACTURE  2007   left 5th metatarsal    There were no vitals filed for this visit.   Subjective Assessment - 08/20/20 1232     Subjective I am dealing with a respiratory infection making me cough more so I am leaking more. I am going through more pads.    Patient Stated  Goals reduce leakage    Currently in Pain? No/denies                            Pelvic Floor Special Questions - 08/20/20 0001     Pelvic Floor Internal Exam Patient confirms identification and approves PT to assess pelvic and treat    Exam Type Vaginal    Strength fair squeeze, definite lift    Strength # of seconds 10               [facility] Adult PT Treatment/Exercise - 08/20/20 0001       Self-Care   Self-Care Other Self-Care Comments    Other Self-Care Comments  reviewed with patient on using the vaginal moisturizers and which one is good for her to use.      Neuro Re-ed    Neuro Re-ed Details  pelvic floor contraction with tactile cues internally for holding for 10 seconds 10x and ending with 5 quick flicks      Lumbar Exercises: Stretches   Piriformis Stretch Right;Left;1 rep;30 seconds    Piriformis Stretch Limitations supine    Other Lumbar Stretch  Exercise indian sit with reaching overhead 30 sec each side; Open book rotation 10 each way    Other Lumbar Stretch Exercise happy baby stretch      Manual Therapy   Manual Therapy Internal Pelvic Floor    Internal Pelvic Floor manual work to the levator ani, perineal body, along the introitus to elongate the tissue                    PT Education - 08/20/20 1312     Education Details Access Code: [email or web address]; reviewed vaginal moisturizers    Person(s) Educated Patient    Methods Explanation;Demonstration;Verbal cues;Handout    Comprehension Returned demonstration;Verbalized understanding              PT Short Term Goals - 06/18/20 1326       PT SHORT TERM GOAL #1   Title independent with initial HEP    Time 4    Period Weeks    Status New    Target Date 07/16/20      PT SHORT TERM GOAL #2   Title educated on vaginal health to improve vaginal dryness    Time 4    Period Weeks    Status New    Target Date 07/16/20               PT Long Term Goals - 06/18/20 1405        PT LONG TERM GOAL #1   Title independent with advanced HEP for pelvic floor,  core and hip strength    Time 12    Period Weeks    Status New    Target Date 09/10/20      PT LONG TERM GOAL #2   Title urinary leakage with coughing, sneezing decreased to minimal to no leakage due to improved coordination of the pelvic floor    Time 12    Period Weeks    Status New    Target Date 09/10/20      PT LONG TERM GOAL #3   Title Patient is able to relax her pelvic floor to fully empty her bowels and improve her urine stream    Time 12    Period Weeks    Status New    Target Date 09/10/20      PT LONG TERM GOAL #4   Title patient is able to return to hiking without worrying about urinary leakage    Time 12    Period Weeks    Status New    Target Date 09/10/20                   Plan - 08/20/20 1234     Clinical Impression Statement Patient just started therapy so she has not met goals at this time. She has not used the vaginal moisturizers so continue to have dryness and irritation to the vulvar area. She has tightness in the introitus and levator ani. Patient was able to feel the pelvic floor contraciton and hold for 10 seconds. Patient has a respiratory infection so she is coughing alot and leaking more urine. Patient will benefit from skilled therapy to improve pelvic floor coordinaiton and reduce her leakage and constipation.    Personal Factors and Comorbidities Comorbidity 3+;Age    Comorbidities RA; Sjorens Syndrome; Osteoporosis; CCIS    Examination-Activity Limitations Continence;Toileting    Stability/Clinical Decision Making Stable/Uncomplicated    Rehab Potential Excellent    PT Frequency 1x / week    PT  Duration 12 weeks    PT Treatment/Interventions ADLs/Self Care Home Management;Biofeedback;Neuromuscular re-education;Therapeutic exercise;Therapeutic activities;Patient/family education;Manual techniques    PT Next Visit Plan diaphragmatic breathing to relax the  pelvic floor; educated on pelvic floor manual work externally and internally; toileting technique with bulging of the pelvic floor    Recommended Other Services MD signed initial note    Consulted and Agree with Plan of Care Patient             Patient will benefit from skilled therapeutic intervention in order to improve the following deficits and impairments:  Decreased coordination, Increased fascial restricitons, Decreased endurance, Decreased activity tolerance, Pain, Decreased mobility, Decreased strength  Visit Diagnosis: Muscle weakness (generalized)  Other lack of coordination  Other muscle spasm  Stress incontinence (female) (female)     Problem List Patient Active Problem List   Diagnosis Date Noted   Fibromyalgia 04/14/2020   Vitamin D deficiency 04/14/2020   Poor sleep 05/17/2016   Osteoporosis 07/03/2015   Anxiety and depression 07/03/2015   Rotator cuff syndrome of right shoulder 06/13/2014   Chronic low back pain 05/13/2014   Rheumatoid arthritis ([place]) 05/13/2014   Sjogren's disease ([place]) 12/08/2011   History of open reduction and internal fixation (ORIF) procedure 05/02/2005   DCIS (ductal carcinoma in situ) of breast 11/02/1990    Earlie Counts, PT 08/20/20 1:20 PM   [facility] [address] [place], [place], [address] Phone: [phone]   Fax:  [phone]  Name: TASMINE HIPWELL MRN: [id number] Date of Birth: 02-03-1955

## 2020-08-20 NOTE — Patient Instructions (Signed)
Access Code: [email or web address] URL: [email or web address] Date: 08/20/2020 Prepared by: Eulis Foster  Exercises Supine Piriformis Stretch Pulling Heel to Hip - 1 x daily - 7 x weekly - 1 sets - 2 reps - 30 sec hold Sidelying Thoracic Rotation with Open Book - 1 x daily - 7 x weekly - 1 sets - 10 reps Supine Pelvic Floor Stretch - 1 x daily - 7 x weekly - 1 sets - 1 reps - 30 sec hold Seated Pelvic Floor Contraction - 3 x daily - 7 x weekly - 1 sets - 5 reps - 10 sec hold  [facility] [address] [place], [place] [address] Phone # [phone] Fax [phone]

## 2020-08-27 ENCOUNTER — Telehealth (INDEPENDENT_AMBULATORY_CARE_PROVIDER_SITE_OTHER): Payer: PPO | Admitting: Family Medicine

## 2020-08-27 ENCOUNTER — Encounter: Payer: Self-pay | Admitting: Family Medicine

## 2020-08-27 VITALS — Ht 61.5 in

## 2020-08-27 DIAGNOSIS — R059 Cough, unspecified: Secondary | ICD-10-CM

## 2020-08-27 DIAGNOSIS — R5383 Other fatigue: Secondary | ICD-10-CM | POA: Diagnosis not present

## 2020-08-27 NOTE — Progress Notes (Signed)
MyChart Video Visit  Virtual Visit via Video Note   This visit type was conducted due to national recommendations for restrictions regarding the COVID-19 Pandemic (e.g. social distancing) in an effort to limit this patient's exposure and mitigate transmission in our community. This patient is at least at moderate risk for complications without adequate follow up. This format is felt to be most appropriate for this patient at this time. Physical exam was limited by quality of the video and audio technology used for the visit.   Patient location: home Provider location: office  I discussed the limitations of evaluation and management by telemedicine and the availability of in person appointments. The patient expressed understanding and agreed to proceed.  Patient: Andrea Fields   DOB: Jan 14, 1956   65 y.o. Female  MRN: [id number] Visit Date: 08/27/2020  Today's healthcare provider: Andrian Sabala [place], MD   Chief Complaint  Patient presents with   Cough    Has completed a round of antibiotics and prednisone.   Nasal Congestion   Fatigue   muscle aches   HPI Andrea Fields is a 65 yo female with hx of COPD,fibromyalgia, anxiety, depression, Sjogren's disease, vitamin D deficiency, and RA concerned about persistent cough. She does "not feel good."    Cough The current episode started 1 to 4 weeks ago. The problem has been gradually improving. The cough is Productive of sputum. Associated symptoms include chest pain (with cough), myalgias, nasal congestion, postnasal drip, rhinorrhea and a sore throat. Pertinent negatives include no chills, ear congestion, ear pain, fever, headaches, heartburn, hemoptysis, rash, shortness of breath, sweats, weight loss or wheezing. Risk factors for lung disease include smoking/tobacco exposure. She has tried oral steroids for the symptoms. The treatment provided moderate relief. Her past medical history is significant for COPD.  In general she feels like she has  improved, initially she had flu like symptoms and most symptoms have resolved. COVID 19 home tests x 2 negative.  Symptoms started 3 days after receiving COVID 19 booster and shingrix vaccination (08/05/20). "Malaise", feeling fatigue and myalgias, mainly  Evaluated in the ER on 08/18/2020. CXR:No active cardiopulmonary disease.  COPD. Discharged on [employer].  Benzonatate did not help much with the cough. Cough is not longer interfering with his sleep. Sore throat with coughing. Chest wall,UE's,and back achy pain.  Smoker: 4 cig/day.  Patient Active Problem List   Diagnosis Date Noted   Fibromyalgia 04/14/2020   Vitamin D deficiency 04/14/2020   Poor sleep 05/17/2016   Osteoporosis 07/03/2015   Anxiety and depression 07/03/2015   Rotator cuff syndrome of right shoulder 06/13/2014   Chronic low back pain 05/13/2014   Rheumatoid arthritis ([facility]) 05/13/2014   Sjogren's disease ([facility]) 12/08/2011   History of open reduction and internal fixation (ORIF) procedure 05/02/2005   DCIS (ductal carcinoma in situ) of breast 11/02/1990   Past Medical History:  Diagnosis Date   Anemia    pt does not recall having this, CBC 05/2015 normal   Anxiety and depression 07/03/2015   -sees psychiatry, [employer]    Chronic low back pain 05/13/2014   DCIS (ductal carcinoma in situ)    1992 dx during pregnancy, s/p lumpectomy remotely and no further treatment; benign biopsy in 2002   Left ankle sprain    w/ lat mal avulsion fx in 2017; saw Dr. Victorino Dike   Osteoporosis 07/03/2015   -seeing Dr. Kathi Ludwig -did not tolerate boniva    Rheumatoid arthritis(714.0)    Dx in 2017, seeing Dr.  Syed, Rheumatologist   Sjogren's syndrome [facility])    Sees Rheumatologist   Social History   Tobacco Use   Smoking status: Every Day    Packs/day: 0.25    Types: Cigarettes   Smokeless tobacco: Never  Substance Use Topics   Alcohol use: Yes    Alcohol/week: 7.0 - 10.0 standard drinks    Types: 7 -  10 Glasses of wine per week   Drug use: No   Allergies  Allergen Reactions   Azithromycin     Joint pain   Boniva [Ibandronic Acid]    Minocycline Hcl Other (See Comments)   Vectrin [Minocycline Hcl]    Medications: Outpatient Medications Prior to Visit  Medication Sig   acetaminophen (TYLENOL) 500 MG tablet 2 tablets as needed   Ascorbic Acid (VITAMIN C) 1000 MG tablet Take 1,000 mg by mouth daily.   Cholecalciferol (VITAMIN D PO) Take 2,000 Units by mouth daily.    cyanocobalamin 100 MCG tablet Take 100 mcg by mouth daily.   denosumab (PROLIA) 60 MG/ML SOLN injection Inject 60 mg into the skin every 6 (six) months. Administer in upper arm, thigh, or abdomen   DULoxetine (CYMBALTA) 60 MG capsule TAKE 1 CAPSULE BY MOUTH EVERY DAY   fish oil-omega-3 fatty acids 1000 MG capsule Take 2 g by mouth daily.   hydroxychloroquine (PLAQUENIL) 200 MG tablet Take 1 tablet by mouth daily.   Multiple Vitamin (MULTIVITAMIN) tablet Take 1 tablet by mouth daily.   naproxen sodium (ANAPROX) 220 MG tablet Take 220 mg by mouth as needed.    Probiotic Product (PROBIOTIC DAILY PO) Take 1 capsule by mouth daily.   [DISCONTINUED] amoxicillin-clavulanate (AUGMENTIN) 875-125 MG tablet Take 1 tablet by mouth every 12 (twelve) hours.   [DISCONTINUED] benzonatate (TESSALON) 200 MG capsule Take 1 capsule (200 mg total) by mouth every 8 (eight) hours.   [DISCONTINUED] predniSONE (DELTASONE) 20 MG tablet Take 1 tablet (20 mg total) by mouth 2 (two) times daily with a meal.   No facility-administered medications prior to visit.  Review of Systems  Constitutional:  Positive for activity change, appetite change and fatigue. Negative for chills, fever and weight loss.  HENT:  Positive for postnasal drip, rhinorrhea and sore throat. Negative for ear pain.   Respiratory:  Positive for cough. Negative for hemoptysis, shortness of breath and wheezing.   Cardiovascular:  Positive for chest pain (with cough).   Gastrointestinal:  Negative for abdominal pain, heartburn, nausea and vomiting.  Genitourinary:  Negative for decreased urine volume, dysuria and hematuria.  Musculoskeletal:  Positive for myalgias. Negative for gait problem.  Skin:  Negative for rash.  Neurological:  Negative for syncope and headaches.  Hematological:  Negative for adenopathy. Does not bruise/bleed easily.     Objective    Ht 5' 1.5" (1.562 m)   BMI 21.56 kg/m   GENERAL: alert, oriented, appears well and in no acute distress  HEENT: atraumatic, conjunctiva clear, no obvious abnormalities on inspection of external nose and ears  NECK: normal movements of the head and neck  LUNGS: on inspection no signs of respiratory distress, breathing rate appears normal, no obvious gross SOB, gasping or wheezing  CV: no obvious cyanosis  MS: moves all visible extremities without noticeable abnormality  PSYCH/NEURO: pleasant and cooperative, no obvious depression or anxiety, speech and thought processing grossly intact   Assessment & Plan    AndreaAneita was seen today for cough, nasal congestion, fatigue and muscle aches.  Diagnoses and all orders for this visit:  Fatigue, unspecified type We discussed possible etiologies. Some of her chronic medical problems could be contributing factors. Fibromyalgia could explain myalgias, continue Cymbalta.  Explained that is not uncommon to have residual fatigue after recovering from a viral illness. I do not think further work-up is necessary at this time. Monitor for new symptoms.  Cough Explained that cough and congestion can last a few days and even weeks after acute symptoms have resolved. Tobacco use and history of COPD may be aggravating problem. Gradually improving, it is no longer affecting her sleep.  She is not having wheezing or dyspnea, so I do not think imaging is needed at this time. Encouraged smoking cessation. Follow-up with PCP as needed.  Return if symptoms  worsen or fail to improve.    I discussed the assessment and treatment plan with the patient. Ms. Buehrer was provided an opportunity to ask questions and all were answered. She agreed with the plan and demonstrated an understanding of the instructions.   The patient was advised to call back or seek an in-person evaluation if the symptoms worsen or if the condition fails to improve as anticipated.  Mc Hollen [place], MD [facility] [place] [phone] (phone) [phone] (fax)  [facility]

## 2020-08-30 ENCOUNTER — Other Ambulatory Visit: Payer: Self-pay | Admitting: Family Medicine

## 2020-09-03 ENCOUNTER — Encounter: Payer: Self-pay | Admitting: Physical Therapy

## 2020-09-03 ENCOUNTER — Ambulatory Visit: Payer: PPO | Attending: Obstetrics & Gynecology | Admitting: Physical Therapy

## 2020-09-03 ENCOUNTER — Other Ambulatory Visit: Payer: Self-pay

## 2020-09-03 DIAGNOSIS — N393 Stress incontinence (female) (male): Secondary | ICD-10-CM | POA: Diagnosis not present

## 2020-09-03 DIAGNOSIS — M6281 Muscle weakness (generalized): Secondary | ICD-10-CM

## 2020-09-03 DIAGNOSIS — M62838 Other muscle spasm: Secondary | ICD-10-CM | POA: Diagnosis not present

## 2020-09-03 DIAGNOSIS — R278 Other lack of coordination: Secondary | ICD-10-CM | POA: Diagnosis not present

## 2020-09-03 NOTE — Patient Instructions (Signed)
Double Voiding can be a very useful technique to help overcome incomplete emptying of your bladder.  Incomplete emptying of urine can result in leakage after using the bathroom and increase the risk of urinary tract infection.   Initial Void: When you first sit down to urinate, ensure optimal positioning for bladder emptying by following these guidelines for toileting posture: Sit on the toilet seat - don't hover over the seat Support your trunk by placing your hands on your knees or thighs Spread your knees and hips wide Position your feet flat on the floor or elevate feet on phone books, foot stool (Squatty Potty), or wrapped toilet paper rolls (if having knees above hips helps you empty) Lean forward from your hips Maintain the normal inward curve in your lower back   Repeated Void: After your initial void is complete, follow these movement patterns and attempt going to the bathroom again. Stand up Rotate your hips as if doing hula hoop in one direction Rotate using the same action in the other direction Rock your hips and pelvis back and forwards ("pelvic tilts") Rock your hips and pelvis side to side ("tail wag") Sit back down and repeat your voiding technique This technique can be repeated as many times as you choose to help you empty your bladder more effectively.   Toileting Techniques for Bowel Movements    An Evacuation/Defecation Plan   Here are the 4 basic points:  Lean forward enough for your elbows to rest on your knees Support your feet on the floor or use a low stool if your feet don't touch the floor  Push out your belly as if you have swallowed a beach ball--you should feel a widening of your waist. "Belly Big, Belly Hard" Open and relax your pelvic floor muscles, rather than tightening around the anus  While you are sitting on the toilet pay attention to the following areas: Jaw and mouth position- relaxed not clenched Angle of your hips - leaning slightly  forward Whether your feet touch the ground or not - should be flat and supported Arm placement - rest against your thighs Spine position - flat back Waist Breathing - exhale as you push (like blowing up a balloon or try using other sounds such as ahhhh, shhhhh, ohhhh or grrrrrrr) Irven Shelling - hard and tight as you push Anus (opening of the anal canal) - relaxed and open as you push Anus - Tighten and lift pulling the muscle back in after you are done or if taking a break  If you are not successful after 10-15 minutes, try again later.  Avoid negative self-talk about your toileting experience.   Read this for more details and ask your PT if you need suggestions for adjustments or limitations:  Sitting on the toilet  a) Make sure your feet are supported - flat on the floor or step stool b) Many people find it effective to lean forward or raise their knees.  Propping your feet on a step stool (Squatty Potty is a brand name) can help the muscles around the anus to relax  c) When you lean forward, place your forearms on your thighs for support  Relaxing Breathe deeply and slowly in through your nose and out through your mouth. To become aware of how to relax your muscles, contracting and releasing muscles can be helpful.  Pull your pelvic floor muscles in tightly by using the image of holding back gas, or closing around the anus (visualize making a circle smaller) and  lifting the anus up and in.  Then release the muscles and your anus should drop down and feel open. Repeat 5 times ending with the feeling of relaxation. Keep your pelvic floor muscles relaxed; let your belly bulge out. The digestive tract starts at the mouth and ends at the anal opening, so be sure to relax both ends of the tube.  Place your tongue on the roof of your mouth with your teeth separated.  This helps relax your mouth and will help to relax the anus at the same time.  Emptying (defecation) a) Keep your pelvic floor and  sphincter relaxed, then bulge your anal muscles.  Make the anal opening wide.  b) Stick your belly out as if you have swallowed a beach ball. c) Make your belly wall hard using your belly muscles while continuing to breathe. Doing this makes it easier to open your anus. d) Breath out and give a grunt (or try using other sounds such as ahhhh, shhhhh, ohhhh or grrrrrrr). e)  Can also try to act as if you are blowing up a balloon as you push  4) Finishing a) As you finish your bowel movement, pull the pelvic floor muscles up and in.  This will leave your anus in the proper place rather than remaining pushed out and down. If you leave your anus pushed out and down, it will start to feel as though that is normal and give you incorrect signals about needing to have a bowel movement.

## 2020-09-03 NOTE — Therapy (Signed)
[facility] [address] [place], [place], [address] Phone: [phone]   Fax:  [phone]  Physical Therapy Treatment  Patient Details  Name: Andrea Fields MRN: [id number] Date of Birth: 1955/02/05 Referring Provider (PT): Dr. Elsie Lincoln   Encounter Date: 09/03/2020   PT End of Session - 09/03/20 1315     Visit Number 3    Date for PT Re-Evaluation 11/26/20    Authorization Type [employer]    Authorization - Visit Number 3    Authorization - Number of Visits 10    PT Start Time 1230    PT Stop Time 1310    PT Time Calculation (min) 40 min    Activity Tolerance Patient tolerated treatment well    Behavior During Therapy [facility] for tasks assessed/performed             Past Medical History:  Diagnosis Date   Anemia    pt does not recall having this, CBC 05/2015 normal   Anxiety and depression 07/03/2015   -sees psychiatry, [facility]    Chronic low back pain 05/13/2014   DCIS (ductal carcinoma in situ)    1992 dx during pregnancy, s/p lumpectomy remotely and no further treatment; benign biopsy in 2002   Left ankle sprain    w/ lat mal avulsion fx in 2017; saw Dr. Victorino Dike   Osteoporosis 07/03/2015   -seeing Dr. Kathi Ludwig -did not tolerate boniva    Rheumatoid arthritis(714.0)    Dx in 2017, seeing Dr. Kathi Ludwig, Rheumatologist   Sjogren's syndrome [facility])    Sees Rheumatologist    Past Surgical History:  Procedure Laterality Date   ABCESS DRAINAGE  2012   sinus   BREAST BIOPSY     BREAST LUMPECTOMY Right 1992   BREAST SURGERY  1992, 2002   biopsy   LAPAROSCOPY  1990   laparoscopy     MOUTH RANULA EXCISION  1996   ORIF FOOT FRACTURE  2007   left 5th metatarsal    There were no vitals filed for this visit.   Subjective Assessment - 09/03/20 1235     Subjective My leakage is better due to reduction of cough. I am doing the exercises more.    Patient Stated Goals reduce leakage    Currently in  Pain? No/denies                [facility] PT Assessment - 09/03/20 0001       Assessment   Medical Diagnosis R10.2 Pelvic pain in female    Referring Provider (PT) Dr. Elsie Lincoln    Onset Date/Surgical Date --   15 years     Precautions   Precautions None      Restrictions   Weight Bearing Restrictions No      Home Environment   Living Environment Private residence      Prior Function   Level of Independence Independent    Vocation Retired    Leisure hiking, gardening, cooking      Cognition   Overall Cognitive Status Within Functional Limits for tasks assessed      Posture/Postural Control   Posture/Postural Control No significant limitations      Strength   Right Hip Extension 4/5    Right Hip ABduction 5/5    Right Hip ADduction 4/5    Left Hip ABduction 5/5    Left Hip ADduction 4/5      Palpation   SI assessment  ASIS are equal  Palpation comment decreased lower rib cage movement, tightness in the hip adductors                        Pelvic Floor Special Questions - 09/03/20 0001     Number of Pregnancies 1    Number of Vaginal Deliveries 1    Currently Sexually Active No    Urinary Leakage Yes    Pad use 2 during the day; 1 during the night for just in case    Activities that cause leaking Sneezing;Coughing;Laughing;Walking    Urinary frequency urine flow is not as strong, will lean forward to assist    Fecal incontinence No   constipation   Strength fair squeeze, definite lift    Strength # of seconds 10               [facility] Adult PT Treatment/Exercise - 09/03/20 0001       Therapeutic Activites    Therapeutic Activities Other Therapeutic Activities    Other Therapeutic Activities educated patient on how to double urinate and have a stool movement, using hip movements for relaxing the pelvic floor      Lumbar Exercises: Standing   Other Standing Lumbar Exercises standing pallof with red band 10x each way      Lumbar  Exercises: Supine   Bent Knee Raise 10 reps   each leg 10; pelvic floor contractrion   Bent Knee Raise Limitations then did the same with pulling red band down for shoulder extension 10x    Bridge with [employer] 15 reps    Bridge with [employer] Limitations with pelvic floor contraction                    PT Education - 09/03/20 1314     Education Details educated patient on double voiding and toileting technique for urination and bowel movements    Person(s) Educated Patient    Methods Explanation;Demonstration;Verbal cues;Handout    Comprehension Returned demonstration;Verbalized understanding              PT Short Term Goals - 09/03/20 1320       PT SHORT TERM GOAL #1   Title independent with initial HEP    Time 4    Period Weeks    Status Achieved    Target Date 07/16/20      PT SHORT TERM GOAL #2   Title educated on vaginal health to improve vaginal dryness    Time 4    Period Weeks    Status Achieved    Target Date 07/16/20               PT Long Term Goals - 06/18/20 1405       PT LONG TERM GOAL #1   Title independent with advanced HEP for pelvic floor,  core and hip strength    Time 12    Period Weeks    Status New    Target Date 09/10/20      PT LONG TERM GOAL #2   Title urinary leakage with coughing, sneezing decreased to minimal to no leakage due to improved coordination of the pelvic floor    Time 12    Period Weeks    Status New    Target Date 09/10/20      PT LONG TERM GOAL #3   Title Patient is able to relax her pelvic floor to fully empty her bowels and improve her urine stream  Time 12    Period Weeks    Status New    Target Date 09/10/20      PT LONG TERM GOAL #4   Title patient is able to return to hiking without worrying about urinary leakage    Time 12    Period Weeks    Status New    Target Date 09/10/20                   Plan - 09/03/20 1250     Clinical Impression Statement Patient  strength for pelvic floor is 3/5 and hold for 10 seconds.  Her leakage has improved since she is getting over a respiratory infection. Patient understands how to toilet correctly to fully empty her stool and urine. Patient continues to have some tightness in the levator ani and introitus. She is now using 1 pad instead of 2. Patient is using vaginal moisturizers and it is helping. Patient will benefit from skilled therapy to improve pelvic floor coordination and reduce her leakage and constipation.    Personal Factors and Comorbidities Comorbidity 3+;Age    Comorbidities RA; Sjorens Syndrome; Osteoporosis; CCIS    Examination-Activity Limitations Continence;Toileting    Stability/Clinical Decision Making Stable/Uncomplicated    Rehab Potential Excellent    PT Frequency 1x / week    PT Duration 12 weeks    PT Treatment/Interventions ADLs/Self Care Home Management;Biofeedback;Neuromuscular re-education;Therapeutic exercise;Therapeutic activities;Patient/family education;Manual techniques    PT Next Visit Plan diaphragmatic breathing to relax the pelvic floor; educated on pelvic floor manual work externally and internally;    PT Home Exercise Plan Access Code: [email or web address]    Recommended Other Services renewal note sent    Consulted and Agree with Plan of Care Patient             Patient will benefit from skilled therapeutic intervention in order to improve the following deficits and impairments:  Decreased coordination, Increased fascial restricitons, Decreased endurance, Decreased activity tolerance, Pain, Decreased mobility, Decreased strength  Visit Diagnosis: Muscle weakness (generalized) - Plan: PT plan of care cert/re-cert  Other lack of coordination - Plan: PT plan of care cert/re-cert  Other muscle spasm - Plan: PT plan of care cert/re-cert  Stress incontinence (female) (female) - Plan: PT plan of care cert/re-cert     Problem List Patient Active Problem List   Diagnosis Date  Noted   Fibromyalgia 04/14/2020   Vitamin D deficiency 04/14/2020   Poor sleep 05/17/2016   Osteoporosis 07/03/2015   Anxiety and depression 07/03/2015   Rotator cuff syndrome of right shoulder 06/13/2014   Chronic low back pain 05/13/2014   Rheumatoid arthritis (HCC) 05/13/2014   Sjogren's disease (HCC) 12/08/2011   History of open reduction and internal fixation (ORIF) procedure 05/02/2005   DCIS (ductal carcinoma in situ) of breast 11/02/1990    Eulis Foster, PT 09/03/20 1:24 PM  [place] [facility] [address] [place], [place], [address] Phone: [phone]   Fax:  [phone]  Name: Andrea Fields MRN: [id number] Date of Birth: 1955/05/28

## 2020-09-17 ENCOUNTER — Encounter: Payer: PPO | Admitting: Physical Therapy

## 2020-10-01 ENCOUNTER — Other Ambulatory Visit: Payer: Self-pay

## 2020-10-01 ENCOUNTER — Encounter: Payer: Self-pay | Admitting: Physical Therapy

## 2020-10-01 ENCOUNTER — Ambulatory Visit: Payer: PPO | Admitting: Physical Therapy

## 2020-10-01 DIAGNOSIS — M62838 Other muscle spasm: Secondary | ICD-10-CM

## 2020-10-01 DIAGNOSIS — M6281 Muscle weakness (generalized): Secondary | ICD-10-CM | POA: Diagnosis not present

## 2020-10-01 DIAGNOSIS — R278 Other lack of coordination: Secondary | ICD-10-CM

## 2020-10-01 DIAGNOSIS — N393 Stress incontinence (female) (male): Secondary | ICD-10-CM

## 2020-10-01 NOTE — Therapy (Signed)
[facility] [address], [address] [place], [place], [address] Phone: [phone]   Fax:  [phone]  Physical Therapy Treatment  Patient Details  Name: Andrea Fields MRN: [id number] Date of Birth: Dec 12, 1955 Referring Provider (PT): Dr. Elsie Lincoln   Encounter Date: 10/01/2020   PT End of Session - 10/01/20 1142     Visit Number 4    Date for PT Re-Evaluation 11/26/20    Authorization Type [employer]    Authorization - Visit Number 4    Authorization - Number of Visits 10    PT Start Time 1100    PT Stop Time 1138    PT Time Calculation (min) 38 min    Activity Tolerance Patient tolerated treatment well    Behavior During Therapy [facility] for tasks assessed/performed             Past Medical History:  Diagnosis Date   Anemia    pt does not recall having this, CBC 05/2015 normal   Anxiety and depression 07/03/2015   -sees psychiatry, [facility]    Chronic low back pain 05/13/2014   DCIS (ductal carcinoma in situ)    1992 dx during pregnancy, s/p lumpectomy remotely and no further treatment; benign biopsy in 2002   Left ankle sprain    w/ lat mal avulsion fx in 2017; saw Dr. Victorino Dike   Osteoporosis 07/03/2015   -seeing Dr. Kathi Ludwig -did not tolerate boniva    Rheumatoid arthritis(714.0)    Dx in 2017, seeing Dr. Kathi Ludwig, Rheumatologist   Sjogren's syndrome [facility])    Sees Rheumatologist    Past Surgical History:  Procedure Laterality Date   ABCESS DRAINAGE  2012   sinus   BREAST BIOPSY     BREAST LUMPECTOMY Right 1992   BREAST SURGERY  1992, 2002   biopsy   LAPAROSCOPY  1990   laparoscopy     MOUTH RANULA EXCISION  1996   ORIF FOOT FRACTURE  2007   left 5th metatarsal    There were no vitals filed for this visit.   Subjective Assessment - 10/01/20 1113     Subjective I have been traveling alot so not as consitient with the exericses. I am wearing a very light pad and change 1 time per day.     Patient Stated Goals reduce leakage    Currently in Pain? No/denies    Multiple Pain Sites No                               [facility] Adult PT Treatment/Exercise - 10/01/20 0001       Self-Care   Self-Care Other Self-Care Comments    Other Self-Care Comments  discussed with patient the importance of her to exercise and encorporate pelvic floor strengthening with her back exercises.      Lumbar Exercises: Stretches   ITB Stretch Right;Left;1 rep;30 seconds    ITB Stretch Limitations standing      Lumbar Exercises: Seated   Other Seated Lumbar Exercises seated pelvic floor contraction holding 10 sec for 5 times then end with 5 quick flicks      Lumbar Exercises: Supine   Bridge 15 reps    Bridge Limitations with pelvic tilt and pelvic floor contraction      Lumbar Exercises: Sidelying   Clam Right;Left;10 reps;1 second    Clam Limitations with pelvic floor contraction and using the red band    Other  Sidelying Lumbar Exercises open book sidely 10x each side, VC to breath in when opening the book then breath out with closing the book and contract the pelvic floor                    PT Education - 10/01/20 1141     Education Details Access Code: [email or web address]    Person(s) Educated Patient    Methods Explanation;Demonstration;Verbal cues;Handout    Comprehension Returned demonstration;Verbalized understanding              PT Short Term Goals - 09/03/20 1320       PT SHORT TERM GOAL #1   Title independent with initial HEP    Time 4    Period Weeks    Status Achieved    Target Date 07/16/20      PT SHORT TERM GOAL #2   Title educated on vaginal health to improve vaginal dryness    Time 4    Period Weeks    Status Achieved    Target Date 07/16/20               PT Long Term Goals - 10/01/20 1146       PT LONG TERM GOAL #1   Title independent with advanced HEP for pelvic floor,  core and hip strength    Time 12    Period Weeks     Status On-going      PT LONG TERM GOAL #2   Title urinary leakage with coughing, sneezing decreased to minimal to no leakage due to improved coordination of the pelvic floor    Time 12    Period Weeks    Status On-going      PT LONG TERM GOAL #3   Title Patient is able to relax her pelvic floor to fully empty her bowels and improve her urine stream    Time 12    Period Weeks    Status On-going      PT LONG TERM GOAL #4   Title patient is able to return to hiking without worrying about urinary leakage    Baseline will be hiking in 2 weeks    Time 12    Period Weeks    Status On-going                   Plan - 10/01/20 1142     Clinical Impression Statement Patient is doing better with her leakage compared to last July when she had a bad cough and leaked alot. She is wearing 1 light pad per day. Patient is having difficulty with incorporating her HEP due to doing her back exercises. Therapist should patient how to incorporate the pelvic floor with back exericses to help with her urinary leakage. Patient will be doing the pelvic floor contaction in siting to continue to work on the strength. Patient will be going away on a hiking trip so will not be able to do the internal work. Patient will benefit from skilled therapy to improve pelvic floor coordination and reduce her leakage and constipation.    Personal Factors and Comorbidities Comorbidity 3+;Age    Comorbidities RA; Sjorens Syndrome; Osteoporosis; CCIS    Examination-Activity Limitations Continence;Toileting    Stability/Clinical Decision Making Stable/Uncomplicated    Rehab Potential Excellent    PT Frequency 1x / week    PT Duration 12 weeks    PT Treatment/Interventions ADLs/Self Care Home Management;Biofeedback;Neuromuscular re-education;Therapeutic exercise;Therapeutic activities;Patient/family education;Manual techniques    PT  Next Visit Plan diaphragmatic breathing to relax the pelvic floor; educated on pelvic  floor manual work externally and internally; discuss constipation    PT Home Exercise Plan Access Code: [email or web address]    Recommended Other Services MD signed note    Consulted and Agree with Plan of Care Patient             Patient will benefit from skilled therapeutic intervention in order to improve the following deficits and impairments:  Decreased coordination, Increased fascial restricitons, Decreased endurance, Decreased activity tolerance, Pain, Decreased mobility, Decreased strength  Visit Diagnosis: Muscle weakness (generalized)  Other lack of coordination  Other muscle spasm  Stress incontinence (female) (female)     Problem List Patient Active Problem List   Diagnosis Date Noted   Fibromyalgia 04/14/2020   Vitamin D deficiency 04/14/2020   Poor sleep 05/17/2016   Osteoporosis 07/03/2015   Anxiety and depression 07/03/2015   Rotator cuff syndrome of right shoulder 06/13/2014   Chronic low back pain 05/13/2014   Rheumatoid arthritis (HCC) 05/13/2014   Sjogren's disease (HCC) 12/08/2011   History of open reduction and internal fixation (ORIF) procedure 05/02/2005   DCIS (ductal carcinoma in situ) of breast 11/02/1990    Eulis Foster, PT 10/01/20 11:47 AM   [place] [facility] [address] [place], [place], [address] Phone: [phone]   Fax:  [phone]  Name: JULIETTE STANDRE MRN: [id number] Date of Birth: 1955/02/08

## 2020-10-01 NOTE — Patient Instructions (Signed)
Access Code: [email or web address] URL: [email or web address] Date: 10/01/2020 Prepared by: Eulis Foster  Exercises Supine Bridge with [employer] Between Knees - 1 x daily - 3 x weekly - 1 sets - 15 reps Clamshell with Resistance - 1 x daily - 3 x weekly - 1 sets - 10 reps Sidelying Thoracic Rotation with Open Book - 1 x daily - 3 x weekly - 1 sets - 10 reps Standing ITB Stretch - 1 x daily - 7 x weekly - 1 sets - 2 reps - 30 sec hold Seated Pelvic Floor Contraction - 1 x daily - 7 x weekly - 3 sets - 10 reps Seated Pelvic Floor Contraction - 4 x daily - 7 x weekly - 1 sets - 5 reps - 10 sec hold [facility] [address] [place], [place] [address] Phone # [phone] Fax [phone]

## 2020-10-07 DIAGNOSIS — Z20822 Contact with and (suspected) exposure to covid-19: Secondary | ICD-10-CM | POA: Diagnosis not present

## 2020-10-20 ENCOUNTER — Other Ambulatory Visit: Payer: Self-pay | Admitting: Family Medicine

## 2020-10-22 ENCOUNTER — Other Ambulatory Visit: Payer: Self-pay

## 2020-10-22 ENCOUNTER — Emergency Department
Admission: RE | Admit: 2020-10-22 | Discharge: 2020-10-22 | Disposition: A | Discharge status: Home / Self Care | Payer: PPO | Source: Ambulatory Visit

## 2020-10-22 ENCOUNTER — Ambulatory Visit: Payer: PPO | Admitting: Physical Therapy

## 2020-10-22 VITALS — BP 132/75 | HR 78 | Temp 99.0°F | Resp 18 | Ht 61.0 in | Wt 115.0 lb

## 2020-10-22 DIAGNOSIS — U071 COVID-19: Secondary | ICD-10-CM

## 2020-10-22 DIAGNOSIS — H9201 Otalgia, right ear: Secondary | ICD-10-CM

## 2020-10-22 NOTE — Discharge Instructions (Signed)
Continue to drink lots of fluids Take Mucinex twice a day Continue with your Flonase Expect improvement over next few days

## 2020-10-22 NOTE — ED Triage Notes (Signed)
Pt presents to Urgent Care with c/o R ear fullness x 4 days; tested positive for COVID 4 days ago.

## 2020-10-22 NOTE — ED Provider Notes (Signed)
[facility]    CSN: [id number] Arrival date & time: 10/22/20  0854      History   Chief Complaint Chief Complaint  Patient presents with   Ear Fullness   Covid Positive   Cough    HPI Andrea Fields is a 65 y.o. female.   HPI  Patient states that she was in [place] on vacation.  She states she developed a "cold" while she was there.  She started to feel progressively more ill so she flew home on Friday, took a COVID test Saturday.  It was positive.  She has symptoms sinus congestion, stuffy nose, postnasal drip, ear pressure and pain on the right.  Mild cough.  No sweats or chills, no headache or body aches.  Past Medical History:  Diagnosis Date   Anemia    pt does not recall having this, CBC 05/2015 normal   Anxiety and depression 07/03/2015   -sees psychiatry, [facility]    Chronic low back pain 05/13/2014   DCIS (ductal carcinoma in situ)    1992 dx during pregnancy, s/p lumpectomy remotely and no further treatment; benign biopsy in 2002   Left ankle sprain    w/ lat mal avulsion fx in 2017; saw Dr. Victorino Dike   Osteoporosis 07/03/2015   -seeing Dr. Kathi Ludwig -did not tolerate boniva    Rheumatoid arthritis(714.0)    Dx in 2017, seeing Dr. Kathi Ludwig, Rheumatologist   Sjogren's syndrome [facility])    Sees Rheumatologist    Patient Active Problem List   Diagnosis Date Noted   Fibromyalgia 04/14/2020   Vitamin D deficiency 04/14/2020   Poor sleep 05/17/2016   Osteoporosis 07/03/2015   Anxiety and depression 07/03/2015   Rotator cuff syndrome of right shoulder 06/13/2014   Chronic low back pain 05/13/2014   Rheumatoid arthritis (HCC) 05/13/2014   Sjogren's disease (HCC) 12/08/2011   History of open reduction and internal fixation (ORIF) procedure 05/02/2005   DCIS (ductal carcinoma in situ) of breast 11/02/1990    Past Surgical History:  Procedure Laterality Date   ABCESS DRAINAGE  2012   sinus   BREAST BIOPSY     BREAST LUMPECTOMY Right 1992   BREAST  SURGERY  1992, 2002   biopsy   LAPAROSCOPY  1990   laparoscopy     MOUTH RANULA EXCISION  1996   ORIF FOOT FRACTURE  2007   left 5th metatarsal    OB History     Gravida  1   Para  1   Term  1   Preterm      AB      Living  1      SAB      IAB      Ectopic      Multiple      Live Births               Home Medications    Prior to Admission medications   Medication Sig Start Date End Date Taking? Authorizing Provider  dextromethorphan-guaiFENesin (MUCINEX DM) 30-600 MG 12hr tablet Take 1 tablet by mouth 2 (two) times daily.   Yes [provider]  acetaminophen (TYLENOL) 500 MG tablet 2 tablets as needed    [provider]  Ascorbic Acid (VITAMIN C) 1000 MG tablet Take 1,000 mg by mouth daily.    [provider]  Cholecalciferol (VITAMIN D PO) Take 2,000 Units by mouth daily.     [provider]  cyanocobalamin 100 MCG tablet Take  100 mcg by mouth daily.    [provider]  denosumab (PROLIA) 60 MG/ML SOLN injection Inject 60 mg into the skin every 6 (six) months. Administer in upper arm, thigh, or abdomen    [provider]  DULoxetine (CYMBALTA) 60 MG capsule TAKE 1 CAPSULE BY MOUTH EVERY DAY 10/21/20   Koberlein, Paris Lore, MD  fish oil-omega-3 fatty acids 1000 MG capsule Take 2 g by mouth daily.    [provider]  hydroxychloroquine (PLAQUENIL) 200 MG tablet Take 1 tablet by mouth daily.    [provider]  Multiple Vitamin (MULTIVITAMIN) tablet Take 1 tablet by mouth daily.    [provider]  naproxen sodium (ANAPROX) 220 MG tablet Take 220 mg by mouth as needed.     [provider]  Probiotic Product (PROBIOTIC DAILY PO) Take 1 capsule by mouth daily.    [provider]    Family History Family History  Problem Relation Age of Onset   Hyperlipidemia Mother    Dementia Mother    Parkinson's disease Mother    Diabetes Mother    Hyperlipidemia Father     Heart block Father    Heart disease Father    Osteoarthritis Father    Other Father        optic ischemia   Hypertension Father    Benign prostatic hyperplasia Father    Cancer Maternal Aunt        breast   Dementia Maternal Grandmother    Osteoarthritis Maternal Grandmother    Osteoarthritis Maternal Grandfather    Diabetes Paternal Grandmother    Arthritis Paternal Grandfather    Heart disease Paternal Grandfather    Asthma Sister    Osteoarthritis Sister    Sleep apnea Brother    Other Sister        detached retina   Osteoarthritis Sister    Healthy Brother    Healthy Brother    Anxiety disorder Daughter     Social History Social History   Tobacco Use   Smoking status: Every Day    Packs/day: 0.25    Types: Cigarettes   Smokeless tobacco: Never  Vaping Use   Vaping Use: Never used  Substance Use Topics   Alcohol use: Yes    Alcohol/week: 7.0 - 10.0 standard drinks    Types: 7 - 10 Glasses of wine per week    Comment: weekly   Drug use: No     Allergies   Azithromycin, Boniva [ibandronic acid], Minocycline hcl, and Vectrin [minocycline hcl]   Review of Systems Review of Systems  See HPI Physical Exam Triage Vital Signs ED Triage Vitals  Enc Vitals Group     BP 10/22/20 0909 132/75     Pulse Rate 10/22/20 0909 78     Resp 10/22/20 0909 18     Temp 10/22/20 0909 99 F (37.2 C)     Temp Source 10/22/20 0909 Oral     SpO2 10/22/20 0909 98 %     Weight 10/22/20 0906 115 lb (52.2 kg)     Height 10/22/20 0906 5\' 1"  (1.549 m)     Head Circumference --      Peak Flow --      Pain Score 10/22/20 0906 0     Pain Loc --      Pain Edu? --      Excl. in GC? --    No data found.  Updated Vital Signs BP 132/75 (BP Location: Right Arm)  Pulse 78   Temp 99 F (37.2 C) (Oral)   Resp 18   Ht 5\' 1"  (1.549 m)   Wt 52.2 kg   SpO2 98%   BMI 21.73 kg/m      Physical Exam Constitutional:      General: She is not in acute distress.    Appearance: She  is well-developed and normal weight. She is not ill-appearing.  HENT:     Head: Normocephalic and atraumatic.     Right Ear: Tympanic membrane, ear canal and external ear normal.     Left Ear: Tympanic membrane, ear canal and external ear normal.     Ears:     Comments: Patient states right ear has mild pressure and decreased hearing.  The TM on the side is clear, has scant barotrauma inferiorly with bruising behind the drum.  No erythema, injection, dull appearance or sign of bacterial infection    Nose: Congestion present.     Mouth/Throat:     Mouth: Mucous membranes are moist.     Pharynx: No posterior oropharyngeal erythema.  Eyes:     Conjunctiva/sclera: Conjunctivae normal.     Pupils: Pupils are equal, round, and reactive to light.  Cardiovascular:     Rate and Rhythm: Normal rate.  Pulmonary:     Effort: Pulmonary effort is normal. No respiratory distress.  Abdominal:     General: There is no distension.     Palpations: Abdomen is soft.  Musculoskeletal:        General: Normal range of motion.     Cervical back: Normal range of motion.  Lymphadenopathy:     Cervical: No cervical adenopathy.  Skin:    General: Skin is warm and dry.  Neurological:     Mental Status: She is alert.  Psychiatric:        Mood and Affect: Mood normal.        Behavior: Behavior normal.     UC Treatments / Results  Labs (all labs ordered are listed, but only abnormal results are displayed) Labs Reviewed - No data to display  EKG   Radiology No results found.  Procedures Procedures (including critical care time)  Medications Ordered in UC Medications - No data to display  Initial Impression / Assessment and Plan / UC Course  I have reviewed the triage vital signs and the nursing notes.  Pertinent labs & imaging results that were available during my care of the patient were reviewed by me and considered in my medical decision making (see chart for details).     Explained to  patient this is part of her viral COVID infection.  Antibiotics are not indicated.  Quarantine Final Clinical Impressions(s) / UC Diagnoses   Final diagnoses:  Otalgia, right  COVID-19     Discharge Instructions      Continue to drink lots of fluids Take Mucinex twice a day Continue with your Flonase Expect improvement over next few days     ED Prescriptions   None    PDMP not reviewed this encounter.   , MD 10/22/20 [phone]

## 2020-10-27 ENCOUNTER — Other Ambulatory Visit: Payer: Self-pay

## 2020-10-27 ENCOUNTER — Ambulatory Visit: Payer: PPO | Attending: Obstetrics & Gynecology | Admitting: Physical Therapy

## 2020-10-27 DIAGNOSIS — M62838 Other muscle spasm: Secondary | ICD-10-CM | POA: Insufficient documentation

## 2020-10-27 DIAGNOSIS — M6281 Muscle weakness (generalized): Secondary | ICD-10-CM

## 2020-10-27 DIAGNOSIS — R278 Other lack of coordination: Secondary | ICD-10-CM | POA: Diagnosis not present

## 2020-10-27 DIAGNOSIS — N393 Stress incontinence (female) (male): Secondary | ICD-10-CM | POA: Insufficient documentation

## 2020-10-27 NOTE — Patient Instructions (Signed)
Access Code: [email or web address] URL: [email or web address] Date: 10/27/2020 Prepared by: Eulis Foster  Exercises Clamshell with Resistance - 1 x daily - 3 x weekly - 1 sets - 10 reps Sidelying Thoracic Rotation with Open Book - 1 x daily - 3 x weekly - 1 sets - 10 reps Standing ITB Stretch - 1 x daily - 7 x weekly - 1 sets - 2 reps - 30 sec hold Seated Pelvic Floor Contraction - 1 x daily - 7 x weekly - 3 sets - 10 reps Seated Pelvic Floor Contraction - 4 x daily - 7 x weekly - 1 sets - 5 reps - 10 sec hold Shoulder Bridge Prep with Continuous Marching - 1 x daily - 7 x weekly - 2 sets - 10 reps Quick Flick Pelvic Floor Contractions Seated - 3 x daily - 7 x weekly - 1 sets - 10 reps Standing Hip Adduction with Resistance - 1 x daily - 7 x weekly - 1 sets - 10 reps [facility] [address], [address] [place], [place] [address] Phone # [phone] Fax [phone]

## 2020-10-27 NOTE — Therapy (Signed)
[facility] [address] [place], [place], [address] Phone: [phone]   Fax:  [phone]  Physical Therapy Treatment  Patient Details  Name: Andrea Fields MRN: [id number] Date of Birth: Apr 16, 1955 Referring Provider (PT): Dr. Silas Sacramento   Encounter Date: 10/27/2020   PT End of Session - 10/27/20 1144     Visit Number 5    Date for PT Re-Evaluation 11/26/20    Authorization Type [employer]    Authorization - Visit Number 5    Authorization - Number of Visits 10    PT Start Time 1100    PT Stop Time 1140    PT Time Calculation (min) 40 min    Activity Tolerance Patient tolerated treatment well    Behavior During Therapy [facility] for tasks assessed/performed             Past Medical History:  Diagnosis Date   Anemia    pt does not recall having this, CBC 05/2015 normal   Anxiety and depression 07/03/2015   -sees psychiatry, Metta Clines    Chronic low back pain 05/13/2014   DCIS (ductal carcinoma in situ)    1992 dx during pregnancy, s/p lumpectomy remotely and no further treatment; benign biopsy in 2002   Left ankle sprain    w/ lat mal avulsion fx in 2017; saw Dr. Doran Durand   Osteoporosis 07/03/2015   -seeing Dr. Dossie Der -did not tolerate boniva    Rheumatoid arthritis(714.0)    Dx in 2017, seeing Dr. Dossie Der, Rheumatologist   Sjogren's syndrome [facility])    Sees Rheumatologist    Past Surgical History:  Procedure Laterality Date   ABCESS DRAINAGE  2012   sinus   BREAST BIOPSY     BREAST LUMPECTOMY Right [place], 2002   biopsy   LAPAROSCOPY  1990   laparoscopy     [place]   ORIF FOOT FRACTURE  2007   left 5th metatarsal    There were no vitals filed for this visit.   Subjective Assessment - 10/27/20 1114     Subjective I had some constipation when I went on vacation. I feel today should be my last day due to finances and feeling comfortable to do my  home exercises.    Patient Stated Goals reduce leakage    Currently in Pain? No/denies                [facility] PT Assessment - 10/27/20 0001       Assessment   Medical Diagnosis R10.2 Pelvic pain in female    Referring Provider (PT) Dr. Silas [place]    Onset Date/Surgical Date --   15 years     Precautions   Precautions None      Restrictions   Weight Bearing Restrictions No      Home Environment   Living Environment Private residence      Prior Function   Level of Independence Independent    Vocation Retired    Leisure hiking, gardening, cooking      Cognition   Overall Cognitive Status Within Functional Limits for tasks assessed      Posture/Postural Control   Posture/Postural Control No significant limitations      Strength   Right Hip Extension 4+/5    Right Hip ABduction 5/5    Right Hip ADduction 4+/5    Left Hip ABduction 5/5    Left Hip ADduction 4+/5  Palpation   SI assessment  ASIS are equal                        Pelvic Floor Special Questions - 10/27/20 0001     Urinary Leakage Yes    Pad use 1 pad due to just in case    Activities that cause leaking Coughing;Sneezing   urinary leakage 85% better   Urinary frequency urine flow is not as strong, will lean forward to assist    Fecal incontinence No    Strength fair squeeze, definite lift               [facility] Adult PT Treatment/Exercise - 10/27/20 0001       Self-Care   Self-Care Other Self-Care Comments    Other Self-Care Comments  discussed with patient how it is important to keep moiturizing the vaginal tissue to keep the ph level in the correct level; humming and diaphragmatic breathing to relax the pelvic floor to fully empty the bladder      Lumbar Exercises: Standing   Other Standing Lumbar Exercises standing hip adduction with red band and quick pelvic floor contraction 10x each side      Lumbar Exercises: Supine   Bridge with March 10 reps;1 second     Bridge with [employer] Limitations then 10 times with red band around knees      Lumbar Exercises: Sidelying   Clam Right;Left;10 reps;1 second    Clam Limitations with pelvic floor contraction and using the red band                     PT Education - 10/27/20 1142     Education Details Access Code: [email or web address]; went over vaginal moisturizers    Person(s) Educated Patient    Methods Explanation;Demonstration;Verbal cues;Handout    Comprehension Returned demonstration;Verbalized understanding              PT Short Term Goals - 10/27/20 1145       PT SHORT TERM GOAL #1   Title independent with initial HEP    Time 4    Period Weeks    Status Achieved      PT SHORT TERM GOAL #2   Title educated on vaginal health to improve vaginal dryness    Time 4    Period Weeks    Status Achieved               PT Long Term Goals - 10/27/20 1125       PT LONG TERM GOAL #1   Title independent with advanced HEP for pelvic floor,  core and hip strength    Time 12    Period Weeks    Status Achieved      PT LONG TERM GOAL #2   Title urinary leakage with coughing, sneezing decreased to minimal to no leakage due to improved coordination of the pelvic floor    Time 12    Period Weeks    Status Achieved      PT LONG TERM GOAL #3   Title Patient is able to relax her pelvic floor to fully empty her bowels and improve her urine stream    Time 12    Status Partially Met      PT LONG TERM GOAL #4   Title patient is able to return to hiking without worrying about urinary leakage    Time 12    Period Weeks  Status Achieved                   Plan - 10/27/20 1145     Clinical Impression Statement Patient reports her urinary leakage is 85% better. She will leak with a cough or sneeze especially when she had COVID. Patient pelvic floor strength is 3/5. She has increased strength in her core, hips and pelvic floor. She wears a light day pad for just in case.  Patient is able to fully empty her bowels. She continues to have to lean forward to fully empty her bladder. Therapist went over ways to relax the pelvic floor while urinating to empty her bladder fully. Patient is independent with her HEP and ready for discharge.    Personal Factors and Comorbidities Comorbidity 3+;Age    Comorbidities RA; Sjorens Syndrome; Osteoporosis; CCIS    Examination-Activity Limitations Continence;Toileting    Stability/Clinical Decision Making Stable/Uncomplicated    Rehab Potential Excellent    PT Treatment/Interventions ADLs/Self Care Home Management;Biofeedback;Neuromuscular re-education;Therapeutic exercise;Therapeutic activities;Patient/family education;Manual techniques    PT Next Visit Plan Discharge to HEP    PT Home Exercise Plan Access Code: [email or web address]    Consulted and Agree with Plan of Care Patient             Patient will benefit from skilled therapeutic intervention in order to improve the following deficits and impairments:  Decreased coordination, Increased fascial restricitons, Decreased endurance, Decreased activity tolerance, Pain, Decreased mobility, Decreased strength  Visit Diagnosis: Muscle weakness (generalized)  Other lack of coordination  Other muscle spasm  Stress incontinence (female) (female)     Problem List Patient Active Problem List   Diagnosis Date Noted   Fibromyalgia 04/14/2020   Vitamin D deficiency 04/14/2020   Poor sleep 05/17/2016   Osteoporosis 07/03/2015   Anxiety and depression 07/03/2015   Rotator cuff syndrome of right shoulder 06/13/2014   Chronic low back pain 05/13/2014   Rheumatoid arthritis ([place]) 05/13/2014   Sjogren's disease ([place]) 12/08/2011   History of open reduction and internal fixation (ORIF) procedure 05/02/2005   DCIS (ductal carcinoma in situ) of breast 11/02/1990    Earlie Counts, PT 10/27/20 3:00 PM  [facility] [address],  [place], [place], [address] Phone: [phone]   Fax:  [phone]  Name: SHELLYANN WANDREY MRN: [id number] Date of Birth: [birth year] PHYSICAL THERAPY DISCHARGE SUMMARY  Visits from Start of Care: 5  Current functional level related to goals / functional outcomes: See above. Patient will still lean forward to fully empty her bladder.    Remaining deficits: See above.    Education / Equipment: HEP   Patient agrees to discharge. Patient goals were partially met. Patient is being discharged due to being pleased with the current functional level. Thank you for the referral. Earlie Counts, PT 10/27/20 3:01 PM

## 2020-11-03 DIAGNOSIS — M359 Systemic involvement of connective tissue, unspecified: Secondary | ICD-10-CM | POA: Diagnosis not present

## 2020-11-03 DIAGNOSIS — M3500 Sicca syndrome, unspecified: Secondary | ICD-10-CM | POA: Diagnosis not present

## 2020-11-03 DIAGNOSIS — M810 Age-related osteoporosis without current pathological fracture: Secondary | ICD-10-CM | POA: Diagnosis not present

## 2020-11-03 DIAGNOSIS — M7060 Trochanteric bursitis, unspecified hip: Secondary | ICD-10-CM | POA: Diagnosis not present

## 2020-11-03 DIAGNOSIS — E559 Vitamin D deficiency, unspecified: Secondary | ICD-10-CM | POA: Diagnosis not present

## 2020-11-03 DIAGNOSIS — M0579 Rheumatoid arthritis with rheumatoid factor of multiple sites without organ or systems involvement: Secondary | ICD-10-CM | POA: Diagnosis not present

## 2020-11-03 LAB — BASIC METABOLIC PANEL
BUN: 12 (ref 4–21)
CO2: 35 — AB (ref 13–22)
Chloride: 99 (ref 99–108)
Creatinine: 1 (ref 0.5–1.1)
Glucose: 96
Potassium: 4.4 (ref 3.4–5.3)
Sodium: 141 (ref 137–147)

## 2020-11-03 LAB — CBC: RBC: 4.51 (ref 3.87–5.11)

## 2020-11-03 LAB — HEPATIC FUNCTION PANEL
ALT: 19 (ref 7–35)
AST: 17 (ref 13–35)
Alkaline Phosphatase: 59 (ref 25–125)
Bilirubin, Total: 0.5

## 2020-11-03 LAB — COMPREHENSIVE METABOLIC PANEL
Albumin: 4 (ref 3.5–5.0)
Calcium: 10.3 (ref 8.7–10.7)
GFR calc non Af Amer: 60
Globulin: 3.8

## 2020-11-03 LAB — CBC AND DIFFERENTIAL
HCT: 42 (ref 36–46)
Hemoglobin: 13.8 (ref 12.0–16.0)
Platelets: 363 (ref 150–399)
WBC: 5.5

## 2020-11-03 LAB — VITAMIN D 25 HYDROXY (VIT D DEFICIENCY, FRACTURES): Vit D, 25-Hydroxy: 57.4

## 2020-11-04 ENCOUNTER — Other Ambulatory Visit: Payer: Self-pay | Admitting: Family Medicine

## 2020-11-04 DIAGNOSIS — Z1231 Encounter for screening mammogram for malignant neoplasm of breast: Secondary | ICD-10-CM

## 2020-11-05 ENCOUNTER — Encounter: Payer: PPO | Admitting: Physical Therapy

## 2020-11-12 DIAGNOSIS — F4323 Adjustment disorder with mixed anxiety and depressed mood: Secondary | ICD-10-CM | POA: Diagnosis not present

## 2020-11-19 ENCOUNTER — Ambulatory Visit: Payer: PPO | Admitting: Physical Therapy

## 2020-12-03 DIAGNOSIS — F4323 Adjustment disorder with mixed anxiety and depressed mood: Secondary | ICD-10-CM | POA: Diagnosis not present

## 2020-12-08 ENCOUNTER — Ambulatory Visit
Admission: RE | Admit: 2020-12-08 | Discharge: 2020-12-08 | Disposition: A | Discharge status: Home / Self Care | Payer: PPO | Source: Ambulatory Visit | Attending: Family Medicine | Admitting: Family Medicine

## 2020-12-08 DIAGNOSIS — Z1231 Encounter for screening mammogram for malignant neoplasm of breast: Secondary | ICD-10-CM

## 2020-12-10 DIAGNOSIS — H2513 Age-related nuclear cataract, bilateral: Secondary | ICD-10-CM | POA: Diagnosis not present

## 2020-12-10 DIAGNOSIS — H5213 Myopia, bilateral: Secondary | ICD-10-CM | POA: Diagnosis not present

## 2020-12-10 DIAGNOSIS — H02403 Unspecified ptosis of bilateral eyelids: Secondary | ICD-10-CM | POA: Diagnosis not present

## 2020-12-10 DIAGNOSIS — Z79899 Other long term (current) drug therapy: Secondary | ICD-10-CM | POA: Diagnosis not present

## 2020-12-30 DIAGNOSIS — H02421 Myogenic ptosis of right eyelid: Secondary | ICD-10-CM | POA: Diagnosis not present

## 2020-12-30 DIAGNOSIS — H53483 Generalized contraction of visual field, bilateral: Secondary | ICD-10-CM | POA: Diagnosis not present

## 2020-12-30 DIAGNOSIS — H02412 Mechanical ptosis of left eyelid: Secondary | ICD-10-CM | POA: Diagnosis not present

## 2020-12-30 DIAGNOSIS — H02411 Mechanical ptosis of right eyelid: Secondary | ICD-10-CM | POA: Diagnosis not present

## 2020-12-30 DIAGNOSIS — H02834 Dermatochalasis of left upper eyelid: Secondary | ICD-10-CM | POA: Diagnosis not present

## 2020-12-30 DIAGNOSIS — H02413 Mechanical ptosis of bilateral eyelids: Secondary | ICD-10-CM | POA: Diagnosis not present

## 2020-12-30 DIAGNOSIS — H0279 Other degenerative disorders of eyelid and periocular area: Secondary | ICD-10-CM | POA: Diagnosis not present

## 2020-12-30 DIAGNOSIS — H02422 Myogenic ptosis of left eyelid: Secondary | ICD-10-CM | POA: Diagnosis not present

## 2020-12-30 DIAGNOSIS — H02831 Dermatochalasis of right upper eyelid: Secondary | ICD-10-CM | POA: Diagnosis not present

## 2020-12-30 DIAGNOSIS — H02423 Myogenic ptosis of bilateral eyelids: Secondary | ICD-10-CM | POA: Diagnosis not present

## 2020-12-30 DIAGNOSIS — H57813 Brow ptosis, bilateral: Secondary | ICD-10-CM | POA: Diagnosis not present

## 2021-01-06 DIAGNOSIS — F4323 Adjustment disorder with mixed anxiety and depressed mood: Secondary | ICD-10-CM | POA: Diagnosis not present

## 2021-01-07 DIAGNOSIS — H53483 Generalized contraction of visual field, bilateral: Secondary | ICD-10-CM | POA: Diagnosis not present

## 2021-02-05 ENCOUNTER — Encounter: Payer: Self-pay | Admitting: Family Medicine

## 2021-03-14 LAB — COLOGUARD

## 2021-03-19 ENCOUNTER — Ambulatory Visit: Payer: PPO

## 2021-03-27 ENCOUNTER — Telehealth: Payer: Self-pay | Admitting: Family Medicine

## 2021-03-27 NOTE — Telephone Encounter (Signed)
Left message for patient to call back and schedule Medicare Annual Wellness Visit (AWV) either virtually or in office. Left  my jabber number [phone]  awvi 02/02/20 per [facility]  please schedule at anytime with [facility] Nurse Health Advisor 1 or 2   This should be a 45 minute visit.  

## 2021-03-30 ENCOUNTER — Telehealth: Payer: Self-pay | Admitting: Family Medicine

## 2021-03-30 NOTE — Telephone Encounter (Signed)
I spoke with patient to schedule AWV.  She stated she sent her cologuard test back around 03/11/21 and was told it would be 2 weeks to get her results.  Patient was checking on her results.  Please advise.

## 2021-04-01 NOTE — Telephone Encounter (Signed)
Spoke with Debarah Crape at [facility] and she stated the initial sample was not enough to complete the test and the second specimen was insufficient DNA in the stool to complete the test  Stated their patient support will contact the patient to see if she will send another sample.  Patient was informed of this.  Message sent to PCP. ?

## 2021-04-01 NOTE — Telephone Encounter (Signed)
I haven't seen result come through. Can you check with cologuard? ?

## 2021-04-01 NOTE — Telephone Encounter (Signed)
Noted. Thanks.

## 2021-04-09 ENCOUNTER — Other Ambulatory Visit: Payer: Self-pay | Admitting: Family Medicine

## 2021-04-27 ENCOUNTER — Ambulatory Visit (INDEPENDENT_AMBULATORY_CARE_PROVIDER_SITE_OTHER): Payer: PPO

## 2021-04-27 VITALS — Ht 61.5 in | Wt 108.0 lb

## 2021-04-27 DIAGNOSIS — Z0000 Encounter for general adult medical examination without abnormal findings: Secondary | ICD-10-CM

## 2021-04-27 NOTE — Patient Instructions (Signed)
Ms. Amrein , ?Thank you for taking time to come for your Medicare Wellness Visit. I appreciate your ongoing commitment to your health goals. Please review the following plan we discussed and let me know if I can assist you in the future.  ? ?Screening recommendations/referrals: ?Colonoscopy: needs new cologuard kit ?Mammogram: completed 12/08/2020, due 12/09/2021 ?Bone Density: completed 02/22/2020 ?Recommended yearly ophthalmology/optometry visit for glaucoma screening and checkup ?Recommended yearly dental visit for hygiene and checkup ? ?Vaccinations: ?Influenza vaccine: due next flu season ?Pneumococcal vaccine: completed 03/07/2020 ?Tdap vaccine: completed 05/13/2014, due 05/12/2024 ?Shingles vaccine: completed   ?Covid-19: 12/12/2019, 05/04/2019, 04/06/2019 ? ?Advanced directives: Please bring a copy of your POA (Power of Attorney) and/or Living Will to your next appointment.  ? ? ?Conditions/risks identified: some smoking ? ?Next appointment: Follow up in one year for your annual wellness visit  ? ? ?Preventive Care 68 Years and Older, Female ?Preventive care refers to lifestyle choices and visits with your health care provider that can promote health and wellness. ?What does preventive care include? ?A yearly physical exam. This is also called an annual well check. ?Dental exams once or twice a year. ?Routine eye exams. Ask your health care provider how often you should have your eyes checked. ?Personal lifestyle choices, including: ?Daily care of your teeth and gums. ?Regular physical activity. ?Eating a healthy diet. ?Avoiding tobacco and drug use. ?Limiting alcohol use. ?Practicing safe sex. ?Taking low-dose aspirin every day. ?Taking vitamin and mineral supplements as recommended by your health care provider. ?What happens during an annual well check? ?The services and screenings done by your health care provider during your annual well check will depend on your age, overall health, lifestyle risk factors, and  family history of disease. ?Counseling  ?Your health care provider may ask you questions about your: ?Alcohol use. ?Tobacco use. ?Drug use. ?Emotional well-being. ?Home and relationship well-being. ?Sexual activity. ?Eating habits. ?History of falls. ?Memory and ability to understand (cognition). ?Work and work Statistician. ?Reproductive health. ?Screening  ?You may have the following tests or measurements: ?Height, weight, and BMI. ?Blood pressure. ?Lipid and cholesterol levels. These may be checked every 5 years, or more frequently if you are over 36 years old. ?Skin check. ?Lung cancer screening. You may have this screening every year starting at age 36 if you have a 30-pack-year history of smoking and currently smoke or have quit within the past 15 years. ?Fecal occult blood test (FOBT) of the stool. You may have this test every year starting at age 32. ?Flexible sigmoidoscopy or colonoscopy. You may have a sigmoidoscopy every 5 years or a colonoscopy every 10 years starting at age 13. ?Hepatitis C blood test. ?Hepatitis B blood test. ?Sexually transmitted disease (STD) testing. ?Diabetes screening. This is done by checking your blood sugar (glucose) after you have not eaten for a while (fasting). You may have this done every 1-3 years. ?Bone density scan. This is done to screen for osteoporosis. You may have this done starting at age 71. ?Mammogram. This may be done every 1-2 years. Talk to your health care provider about how often you should have regular mammograms. ?Talk with your health care provider about your test results, treatment options, and if necessary, the need for more tests. ?Vaccines  ?Your health care provider may recommend certain vaccines, such as: ?Influenza vaccine. This is recommended every year. ?Tetanus, diphtheria, and acellular pertussis (Tdap, Td) vaccine. You may need a Td booster every 10 years. ?Zoster vaccine. You may need this after age  60. ?Pneumococcal 13-valent conjugate  (PCV13) vaccine. One dose is recommended after age 64. ?Pneumococcal polysaccharide (PPSV23) vaccine. One dose is recommended after age 35. ?Talk to your health care provider about which screenings and vaccines you need and how often you need them. ?This information is not intended to replace advice given to you by your health care provider. Make sure you discuss any questions you have with your health care provider. ?Document Released: 02/14/2015 Document Revised: 10/08/2015 Document Reviewed: 11/19/2014 ?Elsevier Interactive Patient Education ? 2017 [employer]. ? ?Fall Prevention in the Home ?Falls can cause injuries. They can happen to people of all ages. There are many things you can do to make your home safe and to help prevent falls. ?What can I do on the outside of my home? ?Regularly fix the edges of walkways and driveways and fix any cracks. ?Remove anything that might make you trip as you walk through a door, such as a raised step or threshold. ?Trim any bushes or trees on the path to your home. ?Use bright outdoor lighting. ?Clear any walking paths of anything that might make someone trip, such as rocks or tools. ?Regularly check to see if handrails are loose or broken. Make sure that both sides of any steps have handrails. ?Any raised decks and porches should have guardrails on the edges. ?Have any leaves, snow, or ice cleared regularly. ?Use sand or salt on walking paths during winter. ?Clean up any spills in your garage right away. This includes oil or grease spills. ?What can I do in the bathroom? ?Use night lights. ?Install grab bars by the toilet and in the tub and shower. Do not use towel bars as grab bars. ?Use non-skid mats or decals in the tub or shower. ?If you need to sit down in the shower, use a plastic, non-slip stool. ?Keep the floor dry. Clean up any water that spills on the floor as soon as it happens. ?Remove soap buildup in the tub or shower regularly. ?Attach bath mats securely with  double-sided non-slip rug tape. ?Do not have throw rugs and other things on the floor that can make you trip. ?What can I do in the bedroom? ?Use night lights. ?Make sure that you have a light by your bed that is easy to reach. ?Do not use any sheets or blankets that are too big for your bed. They should not hang down onto the floor. ?Have a firm chair that has side arms. You can use this for support while you get dressed. ?Do not have throw rugs and other things on the floor that can make you trip. ?What can I do in the kitchen? ?Clean up any spills right away. ?Avoid walking on wet floors. ?Keep items that you use a lot in easy-to-reach places. ?If you need to reach something above you, use a strong step stool that has a grab bar. ?Keep electrical cords out of the way. ?Do not use floor polish or wax that makes floors slippery. If you must use wax, use non-skid floor wax. ?Do not have throw rugs and other things on the floor that can make you trip. ?What can I do with my stairs? ?Do not leave any items on the stairs. ?Make sure that there are handrails on both sides of the stairs and use them. Fix handrails that are broken or loose. Make sure that handrails are as long as the stairways. ?Check any carpeting to make sure that it is firmly attached to the stairs. Fix  any carpet that is loose or worn. ?Avoid having throw rugs at the top or bottom of the stairs. If you do have throw rugs, attach them to the floor with carpet tape. ?Make sure that you have a light switch at the top of the stairs and the bottom of the stairs. If you do not have them, ask someone to add them for you. ?What else can I do to help prevent falls? ?Wear shoes that: ?Do not have high heels. ?Have rubber bottoms. ?Are comfortable and fit you well. ?Are closed at the toe. Do not wear sandals. ?If you use a stepladder: ?Make sure that it is fully opened. Do not climb a closed stepladder. ?Make sure that both sides of the stepladder are locked  into place. ?Ask someone to hold it for you, if possible. ?Clearly mark and make sure that you can see: ?Any grab bars or handrails. ?First and last steps. ?Where the edge of each step is. ?Use tools that help y
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
## 2021-05-01 ENCOUNTER — Ambulatory Visit (INDEPENDENT_AMBULATORY_CARE_PROVIDER_SITE_OTHER): Payer: PPO | Admitting: Family Medicine

## 2021-05-01 ENCOUNTER — Encounter: Payer: Self-pay | Admitting: Family Medicine

## 2021-05-01 VITALS — BP 110/70 | HR 75 | Temp 97.9°F | Ht 61.0 in | Wt 109.3 lb

## 2021-05-01 DIAGNOSIS — M79604 Pain in right leg: Secondary | ICD-10-CM

## 2021-05-01 DIAGNOSIS — F419 Anxiety disorder, unspecified: Secondary | ICD-10-CM

## 2021-05-01 DIAGNOSIS — Z0000 Encounter for general adult medical examination without abnormal findings: Secondary | ICD-10-CM

## 2021-05-01 DIAGNOSIS — M542 Cervicalgia: Secondary | ICD-10-CM

## 2021-05-01 DIAGNOSIS — F32A Depression, unspecified: Secondary | ICD-10-CM

## 2021-05-01 DIAGNOSIS — Z1211 Encounter for screening for malignant neoplasm of colon: Secondary | ICD-10-CM

## 2021-05-01 DIAGNOSIS — M810 Age-related osteoporosis without current pathological fracture: Secondary | ICD-10-CM

## 2021-05-01 DIAGNOSIS — M069 Rheumatoid arthritis, unspecified: Secondary | ICD-10-CM | POA: Diagnosis not present

## 2021-05-01 DIAGNOSIS — Z833 Family history of diabetes mellitus: Secondary | ICD-10-CM

## 2021-05-01 DIAGNOSIS — D0510 Intraductal carcinoma in situ of unspecified breast: Secondary | ICD-10-CM

## 2021-05-01 NOTE — Patient Instructions (Signed)
Dr. Penne Lash - gyn from [place] ?

## 2021-05-01 NOTE — Progress Notes (Signed)
Andrea Fields ?DOB: [birth year] ?Encounter date: 05/01/2021 ? ?This is a 66 y.o. female who presents for complete physical  ? ?History of present illness/Additional concerns: ?Last visit with me was 03/07/2020 for complete physical. ? ?Since before christmas she has had tightness in neck. She has rheum visit next week. Can turn head from side to side. Neck hasn't been this way before. Taking ibuprofen; right now taking daily. She is training for long distance hike. In June she is doing 10 miles/day. Weds 2 days ago strained IT band right leg. Taking 2 aleve every morning. Has had this issue in past. Has roller for leg. Wants to go to PT - feels that this will help get her back on track with both knee/leg and neck. Posterior neck; more on right side. Did try massage but this helped just for a day.  ? ?Rheumatoid arthritis/Sjogren's: Follows with Dr. Dossie Der. She is achy a lot. She doesn't like taking medication if she can avoid it. Taking plaquenil now. Feels more achy since turning 64; right side of knee. Groin pain left side - thinks hip. But feels that these aches/pains are tolerable. Usually takes 2 aleve twice a week. Feels better when she moves, so she walks a lot. Did 24 mile walk in [place] for make a wish. Keeps moving; tries to do exercises with hands and stays active. Just more stiff.  ?  ?Osteoporosis: Prolia.  Last DEXA was 02/22/2020.  Slight improvement in lumbar density, no change in hip density.  ?  ?Anxiety/depression: Cymbalta - mood has been so so. She is seeing therapist. She is in grief group. She has also joined LGBT support group to help with her support of children.  ?  ?She does follow regularly with gynecology, but hasn't been there for awhile. She would like to see gyn at [facility]. She did follow up with doctor there. Still having to bear down to empty completely.  ?  ?She did complete pelvic floor therapy and felt this helped somewhat, but not as much as she would  like.  ?  ?Normal mammogram 12/2020. ?Last Cologuard was 11/22, but had insufficient volume for testing.  Needs to be repeated. ? ?Worries about testing for alzheimers - states she heard of new retinal testing for this. ? ? ?Past Medical History:  ?Diagnosis Date  ? Anemia   ? pt does not recall having this, CBC 05/2015 normal  ? Anxiety and depression 07/03/2015  ? -sees psychiatry, Metta Clines   ? Chronic low back pain 05/13/2014  ? DCIS (ductal carcinoma in situ)   ? 1992 dx during pregnancy, s/p lumpectomy remotely and no further treatment; benign biopsy in 2002  ? Left ankle sprain   ? w/ lat mal avulsion fx in 2017; saw Dr. Doran Durand  ? Osteoporosis 07/03/2015  ? -seeing Dr. Dossie Der -did not tolerate boniva   ? Rheumatoid arthritis(714.0)   ? Dx in 2017, seeing Dr. Dossie Der, Rheumatologist  ? Sjogren's syndrome [facility])   ? Sees Rheumatologist  ? ?Past Surgical History:  ?Procedure Laterality Date  ? ABCESS DRAINAGE  2012  ? sinus  ? BREAST BIOPSY    ? BREAST LUMPECTOMY Right 1992  ? [place], 2002  ? biopsy  ? LAPAROSCOPY  1990  ? laparoscopy    ? [place]  ? ORIF FOOT FRACTURE  2007  ? left 5th metatarsal  ? ?Allergies  ?Allergen Reactions  ? Azithromycin   ?  Joint pain  ?  Boniva [Ibandronic Acid]   ? Minocycline Hcl Other (See Comments)  ? Vectrin [Minocycline Hcl]   ? ?Current Meds  ?Medication Sig  ? acetaminophen (TYLENOL) 500 MG tablet 2 tablets as needed  ? Ascorbic Acid (VITAMIN C) 1000 MG tablet Take 1,000 mg by mouth daily.  ? Cholecalciferol (VITAMIN D PO) Take 2,000 Units by mouth daily.   ? cyanocobalamin 100 MCG tablet Take 100 mcg by mouth daily.  ? denosumab (PROLIA) 60 MG/ML SOLN injection Inject 60 mg into the skin every 6 (six) months. Administer in upper arm, thigh, or abdomen  ? DULoxetine (CYMBALTA) 60 MG capsule TAKE 1 CAPSULE BY MOUTH EVERY DAY  ? fish oil-omega-3 fatty acids 1000 MG capsule Take 2 g by mouth daily.  ? hydroxychloroquine (PLAQUENIL) 200 MG tablet Take 1  tablet by mouth daily.  ? Multiple Vitamin (MULTIVITAMIN) tablet Take 1 tablet by mouth daily.  ? naproxen sodium (ANAPROX) 220 MG tablet Take 220 mg by mouth as needed.   ? Probiotic Product (PROBIOTIC DAILY PO) Take 1 capsule by mouth daily.  ? ?Social History  ? ?Tobacco Use  ? Smoking status: Every Day  ?  Packs/day: 0.25  ?  Types: Cigarettes  ? Smokeless tobacco: Never  ?Substance Use Topics  ? Alcohol use: Yes  ?  Alcohol/week: 7.0 - 10.0 standard drinks  ?  Types: 7 - 10 Glasses of wine per week  ?  Comment: weekly  ? ?Family History  ?Problem Relation Age of Onset  ? Hyperlipidemia Mother   ? Dementia Mother   ? Parkinson's disease Mother   ? Diabetes Mother   ? Hyperlipidemia Father   ? Heart block Father   ? Heart disease Father   ? Osteoarthritis Father   ? Other Father   ?     optic ischemia  ? Hypertension Father   ? Benign prostatic hyperplasia Father   ? Cancer Maternal Aunt   ?     breast  ? Dementia Maternal Grandmother   ? Osteoarthritis Maternal Grandmother   ? Osteoarthritis Maternal Grandfather   ? Diabetes Paternal Grandmother   ? Arthritis Paternal Grandfather   ? Heart disease Paternal Grandfather   ? Asthma Sister   ? Osteoarthritis Sister   ? Sleep apnea Brother   ? Other Sister   ?     detached retina  ? Osteoarthritis Sister   ? Healthy Brother   ? Healthy Brother   ? Anxiety disorder Daughter   ? ? ? ?Review of Systems  ?Constitutional:  Negative for activity change, appetite change, chills, fatigue, fever and unexpected weight change.  ?HENT:  Negative for congestion, ear pain, hearing loss, sinus pressure, sinus pain, sore throat and trouble swallowing.   ?Eyes:  Negative for pain and visual disturbance.  ?Respiratory:  Negative for cough, chest tightness, shortness of breath and wheezing.   ?Cardiovascular:  Negative for chest pain, palpitations and leg swelling.  ?Gastrointestinal:  Negative for abdominal pain, blood in stool, constipation, diarrhea, nausea and vomiting.   ?Genitourinary:  Negative for difficulty urinating and menstrual problem.  ?Musculoskeletal:  Positive for neck pain and neck stiffness. Negative for arthralgias and back pain.  ?Skin:  Negative for rash.  ?Neurological:  Negative for dizziness, weakness, numbness and headaches.  ?Hematological:  Negative for adenopathy. Does not bruise/bleed easily.  ?Psychiatric/Behavioral:  Negative for sleep disturbance and suicidal ideas. The patient is not nervous/anxious.   ? ?CBC:  ?Lab Results  ?Component Value Date  ? WBC  5.5 11/03/2020  ? WBC 4.9 03/13/2020  ? HGB 13.8 11/03/2020  ? HCT 42 11/03/2020  ? MCH 31.7 11/14/2015  ? MCHC 34.7 03/13/2020  ? RDW 12.3 03/13/2020  ? PLT 363 11/03/2020  ? MPV 9.3 11/14/2015  ? ?CMP: ?Lab Results  ?Component Value Date  ? NA 141 11/03/2020  ? K 4.4 11/03/2020  ? CL 99 11/03/2020  ? CO2 35 (A) 11/03/2020  ? GLUCOSE 88 03/13/2020  ? BUN 12 11/03/2020  ? CREATININE 1.0 11/03/2020  ? CREATININE 0.76 03/13/2020  ? CREATININE 0.68 11/14/2015  ? GFRAA >89 11/14/2015  ? CALCIUM 10.3 11/03/2020  ? PROT 7.9 03/13/2020  ? BILITOT 0.6 03/13/2020  ? ALKPHOS 59 11/03/2020  ? ALT 19 11/03/2020  ? AST 17 11/03/2020  ? ?LIPID: ?Lab Results  ?Component Value Date  ? CHOL 235 (H) 03/13/2020  ? TRIG 92.0 03/13/2020  ? HDL 75.50 03/13/2020  ? LDLCALC 141 (H) 03/13/2020  ? ? ?Objective: ? ?BP 110/70 (BP Location: Left Arm, Patient Position: Sitting, Cuff Size: Normal)   Pulse 75   Temp 97.9 ?F (36.6 ?C) (Oral)   Ht 5\' 1"  (1.549 m)   Wt 109 lb 4.8 oz (49.6 kg)   SpO2 99%   BMI 20.65 kg/m?   Weight: 109 lb 4.8 oz (49.6 kg)  ? ?BP Readings from Last 3 Encounters:  ?05/01/21 110/70  ?10/22/20 132/75  ?08/18/20 121/77  ? ?Wt Readings from Last 3 Encounters:  ?05/01/21 109 lb 4.8 oz (49.6 kg)  ?04/27/21 108 lb (49 kg)  ?10/22/20 115 lb (52.2 kg)  ? ? ?Physical Exam ?Constitutional:   ?   General: She is not in acute distress. ?   Appearance: She is well-developed.  ?HENT:  ?   Head: Normocephalic and  atraumatic.  ?   Right Ear: External ear normal.  ?   Left Ear: External ear normal.  ?   Mouth/Throat:  ?   Pharynx: No oropharyngeal exudate.  ?Eyes:  ?   Conjunctiva/sclera: Conjunctivae normal.  ?   Pupils: Pupils are eq

## 2021-05-05 DIAGNOSIS — M810 Age-related osteoporosis without current pathological fracture: Secondary | ICD-10-CM | POA: Diagnosis not present

## 2021-05-05 DIAGNOSIS — E559 Vitamin D deficiency, unspecified: Secondary | ICD-10-CM | POA: Diagnosis not present

## 2021-05-05 DIAGNOSIS — M0579 Rheumatoid arthritis with rheumatoid factor of multiple sites without organ or systems involvement: Secondary | ICD-10-CM | POA: Diagnosis not present

## 2021-05-05 DIAGNOSIS — Z79899 Other long term (current) drug therapy: Secondary | ICD-10-CM | POA: Diagnosis not present

## 2021-05-05 DIAGNOSIS — M359 Systemic involvement of connective tissue, unspecified: Secondary | ICD-10-CM | POA: Diagnosis not present

## 2021-05-05 DIAGNOSIS — M542 Cervicalgia: Secondary | ICD-10-CM | POA: Diagnosis not present

## 2021-05-05 DIAGNOSIS — M3500 Sicca syndrome, unspecified: Secondary | ICD-10-CM | POA: Diagnosis not present

## 2021-05-05 DIAGNOSIS — M7060 Trochanteric bursitis, unspecified hip: Secondary | ICD-10-CM | POA: Diagnosis not present

## 2021-05-15 DIAGNOSIS — M542 Cervicalgia: Secondary | ICD-10-CM | POA: Diagnosis not present

## 2021-05-15 DIAGNOSIS — R262 Difficulty in walking, not elsewhere classified: Secondary | ICD-10-CM | POA: Diagnosis not present

## 2021-05-15 DIAGNOSIS — M79604 Pain in right leg: Secondary | ICD-10-CM | POA: Diagnosis not present

## 2021-05-15 DIAGNOSIS — R531 Weakness: Secondary | ICD-10-CM | POA: Diagnosis not present

## 2021-05-19 ENCOUNTER — Encounter: Payer: Self-pay | Admitting: Family Medicine

## 2021-05-22 DIAGNOSIS — M79604 Pain in right leg: Secondary | ICD-10-CM | POA: Diagnosis not present

## 2021-05-22 DIAGNOSIS — M542 Cervicalgia: Secondary | ICD-10-CM | POA: Diagnosis not present

## 2021-05-22 DIAGNOSIS — R262 Difficulty in walking, not elsewhere classified: Secondary | ICD-10-CM | POA: Diagnosis not present

## 2021-05-22 DIAGNOSIS — R531 Weakness: Secondary | ICD-10-CM | POA: Diagnosis not present

## 2021-05-28 DIAGNOSIS — M79604 Pain in right leg: Secondary | ICD-10-CM | POA: Diagnosis not present

## 2021-05-28 DIAGNOSIS — R262 Difficulty in walking, not elsewhere classified: Secondary | ICD-10-CM | POA: Diagnosis not present

## 2021-05-28 DIAGNOSIS — M542 Cervicalgia: Secondary | ICD-10-CM | POA: Diagnosis not present

## 2021-05-28 DIAGNOSIS — R531 Weakness: Secondary | ICD-10-CM | POA: Diagnosis not present

## 2021-05-29 DIAGNOSIS — R531 Weakness: Secondary | ICD-10-CM | POA: Diagnosis not present

## 2021-05-29 DIAGNOSIS — M79604 Pain in right leg: Secondary | ICD-10-CM | POA: Diagnosis not present

## 2021-05-29 DIAGNOSIS — M542 Cervicalgia: Secondary | ICD-10-CM | POA: Diagnosis not present

## 2021-05-29 DIAGNOSIS — R262 Difficulty in walking, not elsewhere classified: Secondary | ICD-10-CM | POA: Diagnosis not present

## 2021-06-01 HISTORY — PX: PTOSIS REPAIR: SHX6568

## 2021-06-02 DIAGNOSIS — H53483 Generalized contraction of visual field, bilateral: Secondary | ICD-10-CM | POA: Diagnosis not present

## 2021-06-02 DIAGNOSIS — H02411 Mechanical ptosis of right eyelid: Secondary | ICD-10-CM | POA: Diagnosis not present

## 2021-06-02 DIAGNOSIS — H57813 Brow ptosis, bilateral: Secondary | ICD-10-CM | POA: Diagnosis not present

## 2021-06-02 DIAGNOSIS — H02413 Mechanical ptosis of bilateral eyelids: Secondary | ICD-10-CM | POA: Diagnosis not present

## 2021-06-02 DIAGNOSIS — H02834 Dermatochalasis of left upper eyelid: Secondary | ICD-10-CM | POA: Diagnosis not present

## 2021-06-02 DIAGNOSIS — H0279 Other degenerative disorders of eyelid and periocular area: Secondary | ICD-10-CM | POA: Diagnosis not present

## 2021-06-02 DIAGNOSIS — H02412 Mechanical ptosis of left eyelid: Secondary | ICD-10-CM | POA: Diagnosis not present

## 2021-06-02 DIAGNOSIS — H02831 Dermatochalasis of right upper eyelid: Secondary | ICD-10-CM | POA: Diagnosis not present

## 2021-06-02 DIAGNOSIS — H02421 Myogenic ptosis of right eyelid: Secondary | ICD-10-CM | POA: Diagnosis not present

## 2021-06-02 DIAGNOSIS — H02422 Myogenic ptosis of left eyelid: Secondary | ICD-10-CM | POA: Diagnosis not present

## 2021-06-02 DIAGNOSIS — H02423 Myogenic ptosis of bilateral eyelids: Secondary | ICD-10-CM | POA: Diagnosis not present

## 2021-06-11 DIAGNOSIS — M79604 Pain in right leg: Secondary | ICD-10-CM | POA: Diagnosis not present

## 2021-06-11 DIAGNOSIS — M542 Cervicalgia: Secondary | ICD-10-CM | POA: Diagnosis not present

## 2021-06-11 DIAGNOSIS — R531 Weakness: Secondary | ICD-10-CM | POA: Diagnosis not present

## 2021-06-11 DIAGNOSIS — R262 Difficulty in walking, not elsewhere classified: Secondary | ICD-10-CM | POA: Diagnosis not present

## 2021-06-15 DIAGNOSIS — M79604 Pain in right leg: Secondary | ICD-10-CM | POA: Diagnosis not present

## 2021-06-15 DIAGNOSIS — M542 Cervicalgia: Secondary | ICD-10-CM | POA: Diagnosis not present

## 2021-06-15 DIAGNOSIS — R531 Weakness: Secondary | ICD-10-CM | POA: Diagnosis not present

## 2021-06-15 DIAGNOSIS — R262 Difficulty in walking, not elsewhere classified: Secondary | ICD-10-CM | POA: Diagnosis not present

## 2021-06-17 DIAGNOSIS — M79604 Pain in right leg: Secondary | ICD-10-CM | POA: Diagnosis not present

## 2021-06-17 DIAGNOSIS — M542 Cervicalgia: Secondary | ICD-10-CM | POA: Diagnosis not present

## 2021-06-17 DIAGNOSIS — R531 Weakness: Secondary | ICD-10-CM | POA: Diagnosis not present

## 2021-06-17 DIAGNOSIS — R262 Difficulty in walking, not elsewhere classified: Secondary | ICD-10-CM | POA: Diagnosis not present

## 2021-06-19 ENCOUNTER — Emergency Department
Admission: RE | Admit: 2021-06-19 | Discharge: 2021-06-19 | Disposition: A | Discharge status: Home / Self Care | Payer: PPO | Source: Ambulatory Visit

## 2021-06-19 VITALS — BP 143/75 | HR 71 | Temp 98.0°F | Resp 18

## 2021-06-19 DIAGNOSIS — L237 Allergic contact dermatitis due to plants, except food: Secondary | ICD-10-CM | POA: Diagnosis not present

## 2021-06-19 MED ORDER — METHYLPREDNISOLONE 4 MG PO TBPK: ORAL_TABLET | ORAL | 0 refills | Status: DC

## 2021-06-19 MED ORDER — METHYLPREDNISOLONE SODIUM SUCC 125 MG IJ SOLR
125.0000 mg | Freq: Once | INTRAMUSCULAR | Status: AC
  Administered 2021-06-19: 125 mg via INTRAMUSCULAR

## 2021-06-19 NOTE — ED Triage Notes (Signed)
Pt states she went hiking last weekend and got into poison ivy and she has been using OTC creams but rash is continuing to spread up her arms.

## 2021-06-19 NOTE — ED Provider Notes (Signed)
Andrea Fields [facility]    CSN: [id number] Arrival date & time: 06/19/21  1018      History   Chief Complaint Chief Complaint  Patient presents with   Poison Ivy    The rash is on both upper extremities, hands,  chin, neck. New spots have continued to appear for 5 days - Entered by patient   Rash    HPI Andrea Fields is a 66 y.o. female.   HPI 66 year old female presents with poison ivy rash for 1 week.  Patient reports rash of bilateral arms upper extremity, chin, and neck.  Past Medical History:  Diagnosis Date   Anemia    pt does not recall having this, CBC 05/2015 normal   Anxiety and depression 07/03/2015   -sees psychiatry, Metta Clines    Chronic low back pain 05/13/2014   DCIS (ductal carcinoma in situ)    1992 dx during pregnancy, s/p lumpectomy remotely and no further treatment; benign biopsy in 2002   Left ankle sprain    w/ lat mal avulsion fx in 2017; saw Dr. Doran Durand   Osteoporosis 07/03/2015   -seeing Dr. Dossie Der -did not tolerate boniva    Rheumatoid arthritis(714.0)    Dx in 2017, seeing Dr. Dossie Der, Rheumatologist   Sjogren's syndrome [facility])    Sees Rheumatologist    Patient Active Problem List   Diagnosis Date Noted   Fibromyalgia 04/14/2020   Vitamin D deficiency 04/14/2020   Poor sleep 05/17/2016   Osteoporosis 07/03/2015   Anxiety and depression 07/03/2015   Rotator cuff syndrome of right shoulder 06/13/2014   Chronic low back pain 05/13/2014   Rheumatoid arthritis ([place]) 05/13/2014   Sjogren's disease ([place]) 12/08/2011   History of open reduction and internal fixation (ORIF) procedure 05/02/2005   DCIS (ductal carcinoma in situ) of breast 11/02/1990    Past Surgical History:  Procedure Laterality Date   ABCESS DRAINAGE  2012   sinus   BREAST BIOPSY     BREAST LUMPECTOMY Right 1992   BREAST SURGERY  1992, 2002   biopsy   LAPAROSCOPY  1990   laparoscopy     [place]   ORIF FOOT FRACTURE  2007   left 5th metatarsal    OB  History     Gravida  1   Para  1   Term  1   Preterm      AB      Living  1      SAB      IAB      Ectopic      Multiple      Live Births               Home Medications    Prior to Admission medications   Medication Sig Start Date End Date Taking? Authorizing Provider  methylPREDNISolone (MEDROL DOSEPAK) 4 MG TBPK tablet Take as directed. 06/19/21  Yes Eliezer Lofts, FNP  acetaminophen (TYLENOL) 500 MG tablet 2 tablets as needed    [provider]  Ascorbic Acid (VITAMIN C) 1000 MG tablet Take 1,000 mg by mouth daily.    [provider]  Cholecalciferol (VITAMIN D PO) Take 2,000 Units by mouth daily.     [provider]  cyanocobalamin 100 MCG tablet Take 100 mcg by mouth daily.    [provider]  denosumab (PROLIA) 60 MG/ML SOLN injection Inject 60 mg into the skin every 6 (six) months. Administer in upper arm, thigh, or abdomen  [provider]  DULoxetine (CYMBALTA) 60 MG capsule TAKE 1 CAPSULE BY MOUTH EVERY DAY 04/09/21   Koberlein, Steele Berg, MD  fish oil-omega-3 fatty acids 1000 MG capsule Take 2 g by mouth daily.    [provider]  hydroxychloroquine (PLAQUENIL) 200 MG tablet Take 1 tablet by mouth daily.    [provider]  Multiple Vitamin (MULTIVITAMIN) tablet Take 1 tablet by mouth daily.    [provider]  naproxen sodium (ANAPROX) 220 MG tablet Take 220 mg by mouth as needed.     [provider]  Probiotic Product (PROBIOTIC DAILY PO) Take 1 capsule by mouth daily.    [provider]    Family History Family History  Problem Relation Age of Onset   Hyperlipidemia Mother    Dementia Mother    Parkinson's disease Mother    Diabetes Mother    Hyperlipidemia Father    Heart block Father    Heart disease Father    Osteoarthritis Father    Other Father        optic ischemia   Hypertension Father    Benign prostatic hyperplasia Father    Cancer Maternal  Aunt        breast   Dementia Maternal Grandmother    Osteoarthritis Maternal Grandmother    Osteoarthritis Maternal Grandfather    Diabetes Paternal Grandmother    Arthritis Paternal Grandfather    Heart disease Paternal Grandfather    Asthma Sister    Osteoarthritis Sister    Sleep apnea Brother    Other Sister        detached retina   Osteoarthritis Sister    Healthy Brother    Healthy Brother    Anxiety disorder Daughter     Social History Social History   Tobacco Use   Smoking status: Every Day    Packs/day: 0.25    Types: Cigarettes   Smokeless tobacco: Never  Vaping Use   Vaping Use: Never used  Substance Use Topics   Alcohol use: Yes    Alcohol/week: 7.0 - 10.0 standard drinks    Types: 7 - 10 Glasses of wine per week    Comment: weekly   Drug use: No     Allergies   Azithromycin, Boniva [ibandronic acid], Minocycline hcl, and Vectrin [minocycline hcl]   Review of Systems Review of Systems  Skin:  Positive for rash.  All other systems reviewed and are negative.   Physical Exam Triage Vital Signs ED Triage Vitals  Enc Vitals Group     BP 06/19/21 1029 (!) 143/75     Pulse Rate 06/19/21 1029 71     Resp 06/19/21 1029 18     Temp 06/19/21 1029 98 F (36.7 C)     Temp Source 06/19/21 1029 Oral     SpO2 06/19/21 1029 99 %     Weight --      Height --      Head Circumference --      Peak Flow --      Pain Score 06/19/21 1032 0     Pain Loc --      Pain Edu? --      Excl. in [place]? --    No data found.  Updated Vital Signs BP (!) 143/75 (BP Location: Right Arm)   Pulse 71   Temp 98 F (36.7 C) (Oral)   Resp 18   SpO2 99%     Physical Exam Vitals and nursing note reviewed.  Constitutional:      Appearance: Normal appearance. She is normal weight.  HENT:     Head: Normocephalic and atraumatic.     Mouth/Throat:     Mouth: Mucous membranes are moist.     Pharynx: Oropharynx is clear.  Eyes:     Extraocular Movements: Extraocular  movements intact.     Conjunctiva/sclera: Conjunctivae normal.     Pupils: Pupils are equal, round, and reactive to light.  Cardiovascular:     Rate and Rhythm: Normal rate and regular rhythm.     Pulses: Normal pulses.     Heart sounds: Normal heart sounds.  Pulmonary:     Effort: Pulmonary effort is normal.     Breath sounds: Normal breath sounds. No wheezing, rhonchi or rales.  Musculoskeletal:     Cervical back: Normal range of motion and neck supple.  Skin:    General: Skin is warm and dry.     Comments: Face (left-sided cheek)/left lower arm/wrist/hand: Pruritic erythematous maculopapular eruption with linear vesicular lesions noted  Neurological:     General: No focal deficit present.     Mental Status: She is alert and oriented to person, place, and time. Mental status is at baseline.     UC Treatments / Results  Labs (all labs ordered are listed, but only abnormal results are displayed) Labs Reviewed - No data to display  EKG   Radiology No results found.  Procedures Procedures (including critical care time)  Medications Ordered in UC Medications  methylPREDNISolone sodium succinate (SOLU-MEDROL) 125 mg/2 mL injection 125 mg (125 mg Intramuscular Given 06/19/21 1136)    Initial Impression / Assessment and Plan / UC Course  I have reviewed the triage vital signs and the nursing notes.  Pertinent labs & imaging results that were available during my care of the patient were reviewed by me and considered in my medical decision making (see chart for details).     DM: 1.  Poison ivy dermatitis. Instructed patient to take medication as directed with food to completion.  Encouraged patient to increase daily water intake while taking this medication.  Advised/encouraged patient to change bed linens for the next 2 to 3 days.  Advised patient if symptoms worsen and/ or unresolved please follow-up with PCP or here for further evaluation.  Discharged home, hemodynamically  stable. Final Clinical Impressions(s) / UC Diagnoses   Final diagnoses:  Poison ivy dermatitis     Discharge Instructions      Instructed patient to take medication as directed with food to completion.  Encouraged patient to increase daily water intake while taking this medication.  Advised/encouraged patient to change bed linens for the next 2 to 3 days.  Advised patient if symptoms worsen and/ or unresolved please follow-up with PCP or here for further evaluation.     ED Prescriptions     Medication Sig Dispense Auth. Provider   methylPREDNISolone (MEDROL DOSEPAK) 4 MG TBPK tablet Take as directed. 1 each Eliezer Lofts, FNP      PDMP not reviewed this encounter.   Eliezer Lofts, [place] 06/19/21 1145

## 2021-06-19 NOTE — Discharge Instructions (Addendum)
Instructed patient to take medication as directed with food to completion.  Encouraged patient to increase daily water intake while taking this medication.  Advised/encouraged patient to change bed linens for the next 2 to 3 days.  Advised patient if symptoms worsen and/ or unresolved please follow-up with PCP or here for further evaluation.

## 2021-06-22 DIAGNOSIS — H04123 Dry eye syndrome of bilateral lacrimal glands: Secondary | ICD-10-CM | POA: Diagnosis not present

## 2021-06-24 DIAGNOSIS — M542 Cervicalgia: Secondary | ICD-10-CM | POA: Diagnosis not present

## 2021-06-24 DIAGNOSIS — R262 Difficulty in walking, not elsewhere classified: Secondary | ICD-10-CM | POA: Diagnosis not present

## 2021-06-24 DIAGNOSIS — R531 Weakness: Secondary | ICD-10-CM | POA: Diagnosis not present

## 2021-06-24 DIAGNOSIS — M79604 Pain in right leg: Secondary | ICD-10-CM | POA: Diagnosis not present

## 2021-07-01 DIAGNOSIS — R262 Difficulty in walking, not elsewhere classified: Secondary | ICD-10-CM | POA: Diagnosis not present

## 2021-07-01 DIAGNOSIS — M79604 Pain in right leg: Secondary | ICD-10-CM | POA: Diagnosis not present

## 2021-07-01 DIAGNOSIS — M542 Cervicalgia: Secondary | ICD-10-CM | POA: Diagnosis not present

## 2021-07-01 DIAGNOSIS — R531 Weakness: Secondary | ICD-10-CM | POA: Diagnosis not present

## 2021-07-03 DIAGNOSIS — M79604 Pain in right leg: Secondary | ICD-10-CM | POA: Diagnosis not present

## 2021-07-03 DIAGNOSIS — R262 Difficulty in walking, not elsewhere classified: Secondary | ICD-10-CM | POA: Diagnosis not present

## 2021-07-03 DIAGNOSIS — M542 Cervicalgia: Secondary | ICD-10-CM | POA: Diagnosis not present

## 2021-07-03 DIAGNOSIS — R531 Weakness: Secondary | ICD-10-CM | POA: Diagnosis not present

## 2021-07-06 DIAGNOSIS — M79604 Pain in right leg: Secondary | ICD-10-CM | POA: Diagnosis not present

## 2021-07-06 DIAGNOSIS — R531 Weakness: Secondary | ICD-10-CM | POA: Diagnosis not present

## 2021-07-06 DIAGNOSIS — M542 Cervicalgia: Secondary | ICD-10-CM | POA: Diagnosis not present

## 2021-07-06 DIAGNOSIS — R262 Difficulty in walking, not elsewhere classified: Secondary | ICD-10-CM | POA: Diagnosis not present

## 2021-07-08 DIAGNOSIS — M47813 Spondylosis without myelopathy or radiculopathy, cervicothoracic region: Secondary | ICD-10-CM | POA: Diagnosis not present

## 2021-07-08 DIAGNOSIS — M9903 Segmental and somatic dysfunction of lumbar region: Secondary | ICD-10-CM | POA: Diagnosis not present

## 2021-07-08 DIAGNOSIS — M4727 Other spondylosis with radiculopathy, lumbosacral region: Secondary | ICD-10-CM | POA: Diagnosis not present

## 2021-07-08 DIAGNOSIS — M9901 Segmental and somatic dysfunction of cervical region: Secondary | ICD-10-CM | POA: Diagnosis not present

## 2021-07-20 DIAGNOSIS — R531 Weakness: Secondary | ICD-10-CM | POA: Diagnosis not present

## 2021-07-20 DIAGNOSIS — M542 Cervicalgia: Secondary | ICD-10-CM | POA: Diagnosis not present

## 2021-07-20 DIAGNOSIS — M79604 Pain in right leg: Secondary | ICD-10-CM | POA: Diagnosis not present

## 2021-07-20 DIAGNOSIS — R262 Difficulty in walking, not elsewhere classified: Secondary | ICD-10-CM | POA: Diagnosis not present

## 2021-07-21 DIAGNOSIS — M47813 Spondylosis without myelopathy or radiculopathy, cervicothoracic region: Secondary | ICD-10-CM | POA: Diagnosis not present

## 2021-07-21 DIAGNOSIS — M9903 Segmental and somatic dysfunction of lumbar region: Secondary | ICD-10-CM | POA: Diagnosis not present

## 2021-07-21 DIAGNOSIS — M4727 Other spondylosis with radiculopathy, lumbosacral region: Secondary | ICD-10-CM | POA: Diagnosis not present

## 2021-07-21 DIAGNOSIS — M9901 Segmental and somatic dysfunction of cervical region: Secondary | ICD-10-CM | POA: Diagnosis not present

## 2021-07-22 DIAGNOSIS — M9903 Segmental and somatic dysfunction of lumbar region: Secondary | ICD-10-CM | POA: Diagnosis not present

## 2021-07-22 DIAGNOSIS — M9901 Segmental and somatic dysfunction of cervical region: Secondary | ICD-10-CM | POA: Diagnosis not present

## 2021-07-22 DIAGNOSIS — M4727 Other spondylosis with radiculopathy, lumbosacral region: Secondary | ICD-10-CM | POA: Diagnosis not present

## 2021-07-22 DIAGNOSIS — M47813 Spondylosis without myelopathy or radiculopathy, cervicothoracic region: Secondary | ICD-10-CM | POA: Diagnosis not present

## 2021-07-23 DIAGNOSIS — R262 Difficulty in walking, not elsewhere classified: Secondary | ICD-10-CM | POA: Diagnosis not present

## 2021-07-23 DIAGNOSIS — R531 Weakness: Secondary | ICD-10-CM | POA: Diagnosis not present

## 2021-07-23 DIAGNOSIS — M79604 Pain in right leg: Secondary | ICD-10-CM | POA: Diagnosis not present

## 2021-07-23 DIAGNOSIS — M542 Cervicalgia: Secondary | ICD-10-CM | POA: Diagnosis not present

## 2021-07-24 DIAGNOSIS — M9903 Segmental and somatic dysfunction of lumbar region: Secondary | ICD-10-CM | POA: Diagnosis not present

## 2021-07-24 DIAGNOSIS — M9901 Segmental and somatic dysfunction of cervical region: Secondary | ICD-10-CM | POA: Diagnosis not present

## 2021-07-24 DIAGNOSIS — M4727 Other spondylosis with radiculopathy, lumbosacral region: Secondary | ICD-10-CM | POA: Diagnosis not present

## 2021-07-24 DIAGNOSIS — M47813 Spondylosis without myelopathy or radiculopathy, cervicothoracic region: Secondary | ICD-10-CM | POA: Diagnosis not present

## 2021-07-28 DIAGNOSIS — M9901 Segmental and somatic dysfunction of cervical region: Secondary | ICD-10-CM | POA: Diagnosis not present

## 2021-07-28 DIAGNOSIS — M9903 Segmental and somatic dysfunction of lumbar region: Secondary | ICD-10-CM | POA: Diagnosis not present

## 2021-07-28 DIAGNOSIS — M79604 Pain in right leg: Secondary | ICD-10-CM | POA: Diagnosis not present

## 2021-07-28 DIAGNOSIS — R262 Difficulty in walking, not elsewhere classified: Secondary | ICD-10-CM | POA: Diagnosis not present

## 2021-07-28 DIAGNOSIS — M4727 Other spondylosis with radiculopathy, lumbosacral region: Secondary | ICD-10-CM | POA: Diagnosis not present

## 2021-07-28 DIAGNOSIS — M47813 Spondylosis without myelopathy or radiculopathy, cervicothoracic region: Secondary | ICD-10-CM | POA: Diagnosis not present

## 2021-07-28 DIAGNOSIS — M542 Cervicalgia: Secondary | ICD-10-CM | POA: Diagnosis not present

## 2021-07-28 DIAGNOSIS — R531 Weakness: Secondary | ICD-10-CM | POA: Diagnosis not present

## 2021-07-30 DIAGNOSIS — M9903 Segmental and somatic dysfunction of lumbar region: Secondary | ICD-10-CM | POA: Diagnosis not present

## 2021-07-30 DIAGNOSIS — M47813 Spondylosis without myelopathy or radiculopathy, cervicothoracic region: Secondary | ICD-10-CM | POA: Diagnosis not present

## 2021-07-30 DIAGNOSIS — M4727 Other spondylosis with radiculopathy, lumbosacral region: Secondary | ICD-10-CM | POA: Diagnosis not present

## 2021-07-30 DIAGNOSIS — M9901 Segmental and somatic dysfunction of cervical region: Secondary | ICD-10-CM | POA: Diagnosis not present

## 2021-08-05 DIAGNOSIS — M4727 Other spondylosis with radiculopathy, lumbosacral region: Secondary | ICD-10-CM | POA: Diagnosis not present

## 2021-08-05 DIAGNOSIS — R531 Weakness: Secondary | ICD-10-CM | POA: Diagnosis not present

## 2021-08-05 DIAGNOSIS — M542 Cervicalgia: Secondary | ICD-10-CM | POA: Diagnosis not present

## 2021-08-05 DIAGNOSIS — M9901 Segmental and somatic dysfunction of cervical region: Secondary | ICD-10-CM | POA: Diagnosis not present

## 2021-08-05 DIAGNOSIS — M9903 Segmental and somatic dysfunction of lumbar region: Secondary | ICD-10-CM | POA: Diagnosis not present

## 2021-08-05 DIAGNOSIS — R262 Difficulty in walking, not elsewhere classified: Secondary | ICD-10-CM | POA: Diagnosis not present

## 2021-08-05 DIAGNOSIS — M47813 Spondylosis without myelopathy or radiculopathy, cervicothoracic region: Secondary | ICD-10-CM | POA: Diagnosis not present

## 2021-08-05 DIAGNOSIS — M79604 Pain in right leg: Secondary | ICD-10-CM | POA: Diagnosis not present

## 2021-08-06 DIAGNOSIS — M9901 Segmental and somatic dysfunction of cervical region: Secondary | ICD-10-CM | POA: Diagnosis not present

## 2021-08-06 DIAGNOSIS — M47813 Spondylosis without myelopathy or radiculopathy, cervicothoracic region: Secondary | ICD-10-CM | POA: Diagnosis not present

## 2021-08-06 DIAGNOSIS — M9903 Segmental and somatic dysfunction of lumbar region: Secondary | ICD-10-CM | POA: Diagnosis not present

## 2021-08-06 DIAGNOSIS — M4727 Other spondylosis with radiculopathy, lumbosacral region: Secondary | ICD-10-CM | POA: Diagnosis not present

## 2021-08-10 ENCOUNTER — Other Ambulatory Visit: Payer: Self-pay | Admitting: *Deleted

## 2021-08-11 DIAGNOSIS — M4727 Other spondylosis with radiculopathy, lumbosacral region: Secondary | ICD-10-CM | POA: Diagnosis not present

## 2021-08-11 DIAGNOSIS — R262 Difficulty in walking, not elsewhere classified: Secondary | ICD-10-CM | POA: Diagnosis not present

## 2021-08-11 DIAGNOSIS — M9901 Segmental and somatic dysfunction of cervical region: Secondary | ICD-10-CM | POA: Diagnosis not present

## 2021-08-11 DIAGNOSIS — R531 Weakness: Secondary | ICD-10-CM | POA: Diagnosis not present

## 2021-08-11 DIAGNOSIS — M542 Cervicalgia: Secondary | ICD-10-CM | POA: Diagnosis not present

## 2021-08-11 DIAGNOSIS — M9903 Segmental and somatic dysfunction of lumbar region: Secondary | ICD-10-CM | POA: Diagnosis not present

## 2021-08-11 DIAGNOSIS — M79604 Pain in right leg: Secondary | ICD-10-CM | POA: Diagnosis not present

## 2021-08-11 DIAGNOSIS — M47813 Spondylosis without myelopathy or radiculopathy, cervicothoracic region: Secondary | ICD-10-CM | POA: Diagnosis not present

## 2021-08-11 MED ORDER — DULOXETINE HCL 60 MG PO CPEP: ORAL_CAPSULE | ORAL | 0 refills | Status: DC

## 2021-08-13 DIAGNOSIS — M4727 Other spondylosis with radiculopathy, lumbosacral region: Secondary | ICD-10-CM | POA: Diagnosis not present

## 2021-08-13 DIAGNOSIS — M47813 Spondylosis without myelopathy or radiculopathy, cervicothoracic region: Secondary | ICD-10-CM | POA: Diagnosis not present

## 2021-08-13 DIAGNOSIS — M9901 Segmental and somatic dysfunction of cervical region: Secondary | ICD-10-CM | POA: Diagnosis not present

## 2021-08-13 DIAGNOSIS — M9903 Segmental and somatic dysfunction of lumbar region: Secondary | ICD-10-CM | POA: Diagnosis not present

## 2021-08-14 DIAGNOSIS — M47813 Spondylosis without myelopathy or radiculopathy, cervicothoracic region: Secondary | ICD-10-CM | POA: Diagnosis not present

## 2021-08-14 DIAGNOSIS — M9901 Segmental and somatic dysfunction of cervical region: Secondary | ICD-10-CM | POA: Diagnosis not present

## 2021-08-14 DIAGNOSIS — M4727 Other spondylosis with radiculopathy, lumbosacral region: Secondary | ICD-10-CM | POA: Diagnosis not present

## 2021-08-14 DIAGNOSIS — M9903 Segmental and somatic dysfunction of lumbar region: Secondary | ICD-10-CM | POA: Diagnosis not present

## 2021-08-18 DIAGNOSIS — M4727 Other spondylosis with radiculopathy, lumbosacral region: Secondary | ICD-10-CM | POA: Diagnosis not present

## 2021-08-18 DIAGNOSIS — M9903 Segmental and somatic dysfunction of lumbar region: Secondary | ICD-10-CM | POA: Diagnosis not present

## 2021-08-18 DIAGNOSIS — M9901 Segmental and somatic dysfunction of cervical region: Secondary | ICD-10-CM | POA: Diagnosis not present

## 2021-08-18 DIAGNOSIS — M47813 Spondylosis without myelopathy or radiculopathy, cervicothoracic region: Secondary | ICD-10-CM | POA: Diagnosis not present

## 2021-08-19 DIAGNOSIS — M542 Cervicalgia: Secondary | ICD-10-CM | POA: Diagnosis not present

## 2021-08-19 DIAGNOSIS — M9903 Segmental and somatic dysfunction of lumbar region: Secondary | ICD-10-CM | POA: Diagnosis not present

## 2021-08-19 DIAGNOSIS — R531 Weakness: Secondary | ICD-10-CM | POA: Diagnosis not present

## 2021-08-19 DIAGNOSIS — R262 Difficulty in walking, not elsewhere classified: Secondary | ICD-10-CM | POA: Diagnosis not present

## 2021-08-19 DIAGNOSIS — M79604 Pain in right leg: Secondary | ICD-10-CM | POA: Diagnosis not present

## 2021-08-19 DIAGNOSIS — M47813 Spondylosis without myelopathy or radiculopathy, cervicothoracic region: Secondary | ICD-10-CM | POA: Diagnosis not present

## 2021-08-19 DIAGNOSIS — M4727 Other spondylosis with radiculopathy, lumbosacral region: Secondary | ICD-10-CM | POA: Diagnosis not present

## 2021-08-19 DIAGNOSIS — M9901 Segmental and somatic dysfunction of cervical region: Secondary | ICD-10-CM | POA: Diagnosis not present

## 2021-08-27 DIAGNOSIS — R262 Difficulty in walking, not elsewhere classified: Secondary | ICD-10-CM | POA: Diagnosis not present

## 2021-08-27 DIAGNOSIS — M79604 Pain in right leg: Secondary | ICD-10-CM | POA: Diagnosis not present

## 2021-08-27 DIAGNOSIS — R531 Weakness: Secondary | ICD-10-CM | POA: Diagnosis not present

## 2021-08-27 DIAGNOSIS — M542 Cervicalgia: Secondary | ICD-10-CM | POA: Diagnosis not present

## 2021-09-01 DIAGNOSIS — R262 Difficulty in walking, not elsewhere classified: Secondary | ICD-10-CM | POA: Diagnosis not present

## 2021-09-01 DIAGNOSIS — M79604 Pain in right leg: Secondary | ICD-10-CM | POA: Diagnosis not present

## 2021-09-01 DIAGNOSIS — R531 Weakness: Secondary | ICD-10-CM | POA: Diagnosis not present

## 2021-09-01 DIAGNOSIS — M542 Cervicalgia: Secondary | ICD-10-CM | POA: Diagnosis not present

## 2021-09-04 DIAGNOSIS — F4323 Adjustment disorder with mixed anxiety and depressed mood: Secondary | ICD-10-CM | POA: Diagnosis not present

## 2021-09-11 DIAGNOSIS — M542 Cervicalgia: Secondary | ICD-10-CM | POA: Diagnosis not present

## 2021-09-11 DIAGNOSIS — R262 Difficulty in walking, not elsewhere classified: Secondary | ICD-10-CM | POA: Diagnosis not present

## 2021-09-11 DIAGNOSIS — R531 Weakness: Secondary | ICD-10-CM | POA: Diagnosis not present

## 2021-09-11 DIAGNOSIS — M79604 Pain in right leg: Secondary | ICD-10-CM | POA: Diagnosis not present

## 2021-09-16 DIAGNOSIS — M542 Cervicalgia: Secondary | ICD-10-CM | POA: Diagnosis not present

## 2021-09-16 DIAGNOSIS — M79604 Pain in right leg: Secondary | ICD-10-CM | POA: Diagnosis not present

## 2021-09-16 DIAGNOSIS — R531 Weakness: Secondary | ICD-10-CM | POA: Diagnosis not present

## 2021-09-16 DIAGNOSIS — R262 Difficulty in walking, not elsewhere classified: Secondary | ICD-10-CM | POA: Diagnosis not present

## 2021-09-21 DIAGNOSIS — R262 Difficulty in walking, not elsewhere classified: Secondary | ICD-10-CM | POA: Diagnosis not present

## 2021-09-21 DIAGNOSIS — R531 Weakness: Secondary | ICD-10-CM | POA: Diagnosis not present

## 2021-09-21 DIAGNOSIS — M79604 Pain in right leg: Secondary | ICD-10-CM | POA: Diagnosis not present

## 2021-09-21 DIAGNOSIS — M542 Cervicalgia: Secondary | ICD-10-CM | POA: Diagnosis not present

## 2021-09-28 DIAGNOSIS — M542 Cervicalgia: Secondary | ICD-10-CM | POA: Diagnosis not present

## 2021-09-28 DIAGNOSIS — R531 Weakness: Secondary | ICD-10-CM | POA: Diagnosis not present

## 2021-09-28 DIAGNOSIS — R262 Difficulty in walking, not elsewhere classified: Secondary | ICD-10-CM | POA: Diagnosis not present

## 2021-09-28 DIAGNOSIS — M79604 Pain in right leg: Secondary | ICD-10-CM | POA: Diagnosis not present

## 2021-10-08 DIAGNOSIS — R262 Difficulty in walking, not elsewhere classified: Secondary | ICD-10-CM | POA: Diagnosis not present

## 2021-10-08 DIAGNOSIS — M542 Cervicalgia: Secondary | ICD-10-CM | POA: Diagnosis not present

## 2021-10-08 DIAGNOSIS — M79604 Pain in right leg: Secondary | ICD-10-CM | POA: Diagnosis not present

## 2021-10-08 DIAGNOSIS — R531 Weakness: Secondary | ICD-10-CM | POA: Diagnosis not present

## 2021-10-20 DIAGNOSIS — F4323 Adjustment disorder with mixed anxiety and depressed mood: Secondary | ICD-10-CM | POA: Diagnosis not present

## 2021-10-22 DIAGNOSIS — R262 Difficulty in walking, not elsewhere classified: Secondary | ICD-10-CM | POA: Diagnosis not present

## 2021-10-22 DIAGNOSIS — M79604 Pain in right leg: Secondary | ICD-10-CM | POA: Diagnosis not present

## 2021-10-22 DIAGNOSIS — R531 Weakness: Secondary | ICD-10-CM | POA: Diagnosis not present

## 2021-10-22 DIAGNOSIS — M542 Cervicalgia: Secondary | ICD-10-CM | POA: Diagnosis not present

## 2021-10-24 ENCOUNTER — Ambulatory Visit
Admission: RE | Admit: 2021-10-24 | Discharge: 2021-10-24 | Disposition: A | Discharge status: Home / Self Care | Payer: PPO | Source: Ambulatory Visit | Attending: Internal Medicine | Admitting: Internal Medicine

## 2021-10-24 ENCOUNTER — Other Ambulatory Visit: Payer: Self-pay

## 2021-10-24 VITALS — BP 119/70 | HR 76 | Temp 98.6°F | Resp 17

## 2021-10-24 DIAGNOSIS — L237 Allergic contact dermatitis due to plants, except food: Secondary | ICD-10-CM

## 2021-10-24 MED ORDER — HYDROXYZINE HCL 25 MG PO TABS: 25.0000 mg | ORAL_TABLET | Freq: Three times a day (TID) | ORAL | 0 refills | Status: DC | PRN

## 2021-10-24 MED ORDER — PREDNISONE 20 MG PO TABS: 20.0000 mg | ORAL_TABLET | Freq: Every day | ORAL | 0 refills | Status: AC

## 2021-10-24 NOTE — Discharge Instructions (Addendum)
Please continue using calamine lotion to help with itching Take medications as prescribed Maintain adequate hydration If you observe any redness, worsening swelling or discharge from the area involved-please return to urgent care to be reevaluated.

## 2021-10-24 NOTE — ED Provider Notes (Signed)
[facility]    CSN: [id number] Arrival date & time: 10/24/21  6734      History   Chief Complaint Chief Complaint  Patient presents with   Rash    face, neck, arms     HPI Andrea Fields is a 66 y.o. female comes to urgent care with 3-day history of itchy rash on the left side of the face as well as forearms.  Onset was insidious and has progressively worsened.  Patient endorses pulling out weeds from her yard prior to onset of the rash.  She also likes to go hiking a lot.  No fever or chills.  No redness of the eye.  She denies any tick bites.  No nausea, vomiting or generalized body aches.  Patient has been using calamine lotion to help with itching.  Rash is not painful.  Patient denies any numbness or tingling on the left side of the face.  HPI  Past Medical History:  Diagnosis Date   Anemia    pt does not recall having this, CBC 05/2015 normal   Anxiety and depression 07/03/2015   -sees psychiatry, Metta Clines    Chronic low back pain 05/13/2014   DCIS (ductal carcinoma in situ)    1992 dx during pregnancy, s/p lumpectomy remotely and no further treatment; benign biopsy in 2002   Left ankle sprain    w/ lat mal avulsion fx in 2017; saw Dr. Doran Durand   Osteoporosis 07/03/2015   -seeing Dr. Dossie Der -did not tolerate boniva    Rheumatoid arthritis(714.0)    Dx in 2017, seeing Dr. Dossie Der, Rheumatologist   Sjogren's syndrome [facility])    Sees Rheumatologist    Patient Active Problem List   Diagnosis Date Noted   Fibromyalgia 04/14/2020   Vitamin D deficiency 04/14/2020   Poor sleep 05/17/2016   Osteoporosis 07/03/2015   Anxiety and depression 07/03/2015   Rotator cuff syndrome of right shoulder 06/13/2014   Chronic low back pain 05/13/2014   Rheumatoid arthritis ([place]) 05/13/2014   Sjogren's disease ([place]) 12/08/2011   History of open reduction and internal fixation (ORIF) procedure 05/02/2005   DCIS (ductal carcinoma in situ) of breast 11/02/1990    Past Surgical  History:  Procedure Laterality Date   ABCESS DRAINAGE  2012   sinus   BREAST BIOPSY     BREAST LUMPECTOMY Right 1992   BREAST SURGERY  1992, 2002   biopsy   LAPAROSCOPY  1990   laparoscopy     [place]   ORIF FOOT FRACTURE  2007   left 5th metatarsal    OB History     Gravida  1   Para  1   Term  1   Preterm      AB      Living  1      SAB      IAB      Ectopic      Multiple      Live Births               Home Medications    Prior to Admission medications   Medication Sig Start Date End Date Taking? Authorizing Provider  hydrOXYzine (ATARAX) 25 MG tablet Take 1 tablet (25 mg total) by mouth every 8 (eight) hours as needed. 10/24/21  Yes Eudell Julian, Myrene Galas, MD  predniSONE (DELTASONE) 20 MG tablet Take 1 tablet (20 mg total) by mouth daily for 5 days. 10/24/21 10/29/21 Yes Emanii Bugbee, Myrene Galas, MD  acetaminophen (TYLENOL) 500 MG tablet 2 tablets as needed    [provider]  Ascorbic Acid (VITAMIN C) 1000 MG tablet Take 1,000 mg by mouth daily.    [provider]  Cholecalciferol (VITAMIN D PO) Take 2,000 Units by mouth daily.     [provider]  cyanocobalamin 100 MCG tablet Take 100 mcg by mouth daily.    [provider]  cycloSPORINE (RESTASIS) 0.05 % ophthalmic emulsion PLACE 1 DROP IN BOTH EYE TWICE DAILY    [provider]  denosumab (PROLIA) 60 MG/ML SOLN injection Inject 60 mg into the skin every 6 (six) months. Administer in upper arm, thigh, or abdomen    [provider]  DULoxetine (CYMBALTA) 60 MG capsule TAKE 1 CAPSULE BY MOUTH EVERY DAY 08/11/21   Worthy Rancher B, FNP  fish oil-omega-3 fatty acids 1000 MG capsule Take 2 g by mouth daily.    [provider]  hydroxychloroquine (PLAQUENIL) 200 MG tablet Take 1 tablet by mouth daily.    [provider]  Multiple Vitamin (MULTIVITAMIN) tablet Take 1 tablet by mouth daily.    [provider]  naproxen  sodium (ANAPROX) 220 MG tablet Take 220 mg by mouth as needed.     [provider]  Probiotic Product (PROBIOTIC DAILY PO) Take 1 capsule by mouth daily.    [provider]    Family History Family History  Problem Relation Age of Onset   Hyperlipidemia Mother    Dementia Mother    Parkinson's disease Mother    Diabetes Mother    Hyperlipidemia Father    Heart block Father    Heart disease Father    Osteoarthritis Father    Other Father        optic ischemia   Hypertension Father    Benign prostatic hyperplasia Father    Cancer Maternal Aunt        breast   Dementia Maternal Grandmother    Osteoarthritis Maternal Grandmother    Osteoarthritis Maternal Grandfather    Diabetes Paternal Grandmother    Arthritis Paternal Grandfather    Heart disease Paternal Grandfather    Asthma Sister    Osteoarthritis Sister    Sleep apnea Brother    Other Sister        detached retina   Osteoarthritis Sister    Healthy Brother    Healthy Brother    Anxiety disorder Daughter     Social History Social History   Tobacco Use   Smoking status: Every Day    Packs/day: 0.25    Types: Cigarettes   Smokeless tobacco: Never  Vaping Use   Vaping Use: Never used  Substance Use Topics   Alcohol use: Yes    Alcohol/week: 7.0 - 10.0 standard drinks of alcohol    Types: 7 - 10 Glasses of wine per week    Comment: weekly   Drug use: No     Allergies   Azithromycin, Boniva [ibandronic acid], Minocycline hcl, and Vectrin [minocycline hcl]   Review of Systems Review of Systems  Constitutional: Negative.   Genitourinary: Negative.   Musculoskeletal: Negative.   Skin:  Positive for rash. Negative for color change.     Physical Exam Triage Vital Signs ED Triage Vitals  Enc Vitals Group     BP 10/24/21 0830 119/70     Pulse Rate 10/24/21 0830 76     Resp 10/24/21 0830 17     Temp 10/24/21 0830 98.6 F (37 C)  Temp Source 10/24/21 0830 Oral     SpO2 10/24/21  0830 96 %     Weight --      Height --      Head Circumference --      Peak Flow --      Pain Score 10/24/21 0831 0     Pain Loc --      Pain Edu? --      Excl. in GC? --    No data found.  Updated Vital Signs BP 119/70 (BP Location: Right Arm)   Pulse 76   Temp 98.6 F (37 C) (Oral)   Resp 17   SpO2 96%   Visual Acuity Right Eye Distance:   Left Eye Distance:   Bilateral Distance:    Right Eye Near:   Left Eye Near:    Bilateral Near:     Physical Exam Vitals and nursing note reviewed.  Constitutional:      General: She is not in acute distress.    Appearance: Normal appearance. She is not ill-appearing.  Cardiovascular:     Rate and Rhythm: Normal rate and regular rhythm.  Musculoskeletal:        General: Normal range of motion.  Skin:    General: Skin is warm.     Comments: Papular rash over the left side of the face.  Rash is now confined to a dermatome.  No vesicles noted.  Similar rash is noted on the upper extremities.  No surrounding erythema.  Neurological:     Mental Status: She is alert.      UC Treatments / Results  Labs (all labs ordered are listed, but only abnormal results are displayed) Labs Reviewed - No data to display  EKG   Radiology No results found.  Procedures Procedures (including critical care time)  Medications Ordered in UC Medications - No data to display  Initial Impression / Assessment and Plan / UC Course  I have reviewed the triage vital signs and the nursing notes.  Pertinent labs & imaging results that were available during my care of the patient were reviewed by me and considered in my medical decision making (see chart for details).     1.  Allergic contact dermatitis: Short course of prednisone Hydroxyzine as needed for itching Continue calamine lotion use Return precautions given. Final Clinical Impressions(s) / UC Diagnoses   Final diagnoses:  Allergic contact dermatitis due to plants, except food      Discharge Instructions      Please continue using calamine lotion to help with itching Take medications as prescribed Maintain adequate hydration If you observe any redness, worsening swelling or discharge from the area involved-please return to urgent care to be reevaluated.   ED Prescriptions     Medication Sig Dispense Auth. Provider   predniSONE (DELTASONE) 20 MG tablet Take 1 tablet (20 mg total) by mouth daily for 5 days. 5 tablet Juni Glaab, Britta Mccreedy, MD   hydrOXYzine (ATARAX) 25 MG tablet Take 1 tablet (25 mg total) by mouth every 8 (eight) hours as needed. 15 tablet Derrick Orris, Britta Mccreedy, MD      PDMP not reviewed this encounter.   Merrilee Jansky, MD 10/24/21 [phone]

## 2021-10-24 NOTE — ED Triage Notes (Signed)
Pt c/o rash since Wed. Located on face, neck and arms. Very itchy. Used calamine and Aveeno with hydrocortisone in it prn.

## 2021-10-25 ENCOUNTER — Telehealth: Payer: Self-pay

## 2021-10-25 NOTE — Telephone Encounter (Signed)
TC to f/u with pt after yesterday's visit to [facility]. Pt states she is doing somewhat better and has no problems or questions at this time.

## 2021-11-02 DIAGNOSIS — M542 Cervicalgia: Secondary | ICD-10-CM | POA: Diagnosis not present

## 2021-11-02 DIAGNOSIS — R262 Difficulty in walking, not elsewhere classified: Secondary | ICD-10-CM | POA: Diagnosis not present

## 2021-11-02 DIAGNOSIS — M79604 Pain in right leg: Secondary | ICD-10-CM | POA: Diagnosis not present

## 2021-11-02 DIAGNOSIS — R531 Weakness: Secondary | ICD-10-CM | POA: Diagnosis not present

## 2021-11-09 DIAGNOSIS — M0579 Rheumatoid arthritis with rheumatoid factor of multiple sites without organ or systems involvement: Secondary | ICD-10-CM | POA: Diagnosis not present

## 2021-11-09 DIAGNOSIS — M3500 Sicca syndrome, unspecified: Secondary | ICD-10-CM | POA: Diagnosis not present

## 2021-11-09 DIAGNOSIS — E559 Vitamin D deficiency, unspecified: Secondary | ICD-10-CM | POA: Diagnosis not present

## 2021-11-09 DIAGNOSIS — Z79899 Other long term (current) drug therapy: Secondary | ICD-10-CM | POA: Diagnosis not present

## 2021-11-09 DIAGNOSIS — M810 Age-related osteoporosis without current pathological fracture: Secondary | ICD-10-CM | POA: Diagnosis not present

## 2021-11-09 DIAGNOSIS — M7060 Trochanteric bursitis, unspecified hip: Secondary | ICD-10-CM | POA: Diagnosis not present

## 2021-11-09 DIAGNOSIS — M359 Systemic involvement of connective tissue, unspecified: Secondary | ICD-10-CM | POA: Diagnosis not present

## 2021-11-09 DIAGNOSIS — M549 Dorsalgia, unspecified: Secondary | ICD-10-CM | POA: Diagnosis not present

## 2021-11-11 ENCOUNTER — Telehealth: Payer: Self-pay | Admitting: Family Medicine

## 2021-11-11 ENCOUNTER — Other Ambulatory Visit: Payer: Self-pay | Admitting: Adult Health

## 2021-11-11 ENCOUNTER — Other Ambulatory Visit: Payer: Self-pay

## 2021-11-11 ENCOUNTER — Other Ambulatory Visit: Payer: Self-pay | Admitting: Family

## 2021-11-11 DIAGNOSIS — Z1231 Encounter for screening mammogram for malignant neoplasm of breast: Secondary | ICD-10-CM

## 2021-11-11 MED ORDER — DULOXETINE HCL 60 MG PO CPEP: ORAL_CAPSULE | ORAL | 0 refills | Status: DC

## 2021-11-11 NOTE — Telephone Encounter (Signed)
You do not refill meds if you have not see before right

## 2021-11-11 NOTE — Telephone Encounter (Signed)
Pt requests TOC from [place] to [facility]. Please advise.    Pt request refill of DULoxetine (CYMBALTA) 60 MG capsule   [id number] - [place] [place] - [address] Phone:  [phone]  Fax:  [phone]

## 2021-11-11 NOTE — Telephone Encounter (Signed)
Called patient let know that 30 can be sent in until she has had toc with [facility]. I have verified pharmacy and sent as directed for #30 no refills.

## 2021-11-18 DIAGNOSIS — R262 Difficulty in walking, not elsewhere classified: Secondary | ICD-10-CM | POA: Diagnosis not present

## 2021-11-18 DIAGNOSIS — R531 Weakness: Secondary | ICD-10-CM | POA: Diagnosis not present

## 2021-11-18 DIAGNOSIS — M542 Cervicalgia: Secondary | ICD-10-CM | POA: Diagnosis not present

## 2021-11-18 DIAGNOSIS — M79604 Pain in right leg: Secondary | ICD-10-CM | POA: Diagnosis not present

## 2021-11-25 DIAGNOSIS — M542 Cervicalgia: Secondary | ICD-10-CM | POA: Diagnosis not present

## 2021-11-25 DIAGNOSIS — R262 Difficulty in walking, not elsewhere classified: Secondary | ICD-10-CM | POA: Diagnosis not present

## 2021-11-25 DIAGNOSIS — R531 Weakness: Secondary | ICD-10-CM | POA: Diagnosis not present

## 2021-11-25 DIAGNOSIS — M79604 Pain in right leg: Secondary | ICD-10-CM | POA: Diagnosis not present

## 2021-12-15 ENCOUNTER — Ambulatory Visit
Admission: RE | Admit: 2021-12-15 | Discharge: 2021-12-15 | Disposition: A | Discharge status: Home / Self Care | Payer: PPO | Source: Ambulatory Visit | Attending: Adult Health | Admitting: Adult Health

## 2021-12-15 DIAGNOSIS — Z1231 Encounter for screening mammogram for malignant neoplasm of breast: Secondary | ICD-10-CM | POA: Diagnosis not present

## 2021-12-15 DIAGNOSIS — R531 Weakness: Secondary | ICD-10-CM | POA: Diagnosis not present

## 2021-12-15 DIAGNOSIS — R262 Difficulty in walking, not elsewhere classified: Secondary | ICD-10-CM | POA: Diagnosis not present

## 2021-12-15 DIAGNOSIS — M542 Cervicalgia: Secondary | ICD-10-CM | POA: Diagnosis not present

## 2021-12-15 DIAGNOSIS — M79604 Pain in right leg: Secondary | ICD-10-CM | POA: Diagnosis not present

## 2021-12-17 ENCOUNTER — Ambulatory Visit (INDEPENDENT_AMBULATORY_CARE_PROVIDER_SITE_OTHER): Payer: PPO | Admitting: Adult Health

## 2021-12-17 ENCOUNTER — Encounter: Payer: Self-pay | Admitting: Adult Health

## 2021-12-17 VITALS — BP 120/80 | HR 79 | Temp 98.5°F | Ht 61.0 in | Wt 113.0 lb

## 2021-12-17 DIAGNOSIS — M069 Rheumatoid arthritis, unspecified: Secondary | ICD-10-CM | POA: Diagnosis not present

## 2021-12-17 DIAGNOSIS — Z7689 Persons encountering health services in other specified circumstances: Secondary | ICD-10-CM

## 2021-12-17 DIAGNOSIS — F32A Depression, unspecified: Secondary | ICD-10-CM | POA: Diagnosis not present

## 2021-12-17 DIAGNOSIS — Z720 Tobacco use: Secondary | ICD-10-CM | POA: Diagnosis not present

## 2021-12-17 DIAGNOSIS — M3500 Sicca syndrome, unspecified: Secondary | ICD-10-CM

## 2021-12-17 DIAGNOSIS — F419 Anxiety disorder, unspecified: Secondary | ICD-10-CM

## 2021-12-17 DIAGNOSIS — M810 Age-related osteoporosis without current pathological fracture: Secondary | ICD-10-CM | POA: Diagnosis not present

## 2021-12-17 DIAGNOSIS — Z23 Encounter for immunization: Secondary | ICD-10-CM

## 2021-12-17 MED ORDER — DULOXETINE HCL 60 MG PO CPEP: ORAL_CAPSULE | ORAL | 1 refills | Status: DC
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
## 2021-12-17 NOTE — Patient Instructions (Signed)
It was great meeting you today   I have refilled your cymbalta   Please follow up in March 2024 for your physical exam   If you need anything in the meantime, let me know.

## 2021-12-18 DIAGNOSIS — M542 Cervicalgia: Secondary | ICD-10-CM | POA: Diagnosis not present

## 2021-12-18 DIAGNOSIS — R262 Difficulty in walking, not elsewhere classified: Secondary | ICD-10-CM | POA: Diagnosis not present

## 2021-12-18 DIAGNOSIS — R531 Weakness: Secondary | ICD-10-CM | POA: Diagnosis not present

## 2021-12-18 DIAGNOSIS — M79604 Pain in right leg: Secondary | ICD-10-CM | POA: Diagnosis not present

## 2022-01-04 DIAGNOSIS — H2513 Age-related nuclear cataract, bilateral: Secondary | ICD-10-CM | POA: Diagnosis not present

## 2022-01-04 DIAGNOSIS — H5213 Myopia, bilateral: Secondary | ICD-10-CM | POA: Diagnosis not present

## 2022-01-04 DIAGNOSIS — H04123 Dry eye syndrome of bilateral lacrimal glands: Secondary | ICD-10-CM | POA: Diagnosis not present

## 2022-01-07 DIAGNOSIS — R531 Weakness: Secondary | ICD-10-CM | POA: Diagnosis not present

## 2022-01-07 DIAGNOSIS — M79604 Pain in right leg: Secondary | ICD-10-CM | POA: Diagnosis not present

## 2022-01-07 DIAGNOSIS — M542 Cervicalgia: Secondary | ICD-10-CM | POA: Diagnosis not present

## 2022-01-07 DIAGNOSIS — R262 Difficulty in walking, not elsewhere classified: Secondary | ICD-10-CM | POA: Diagnosis not present

## 2022-01-11 DIAGNOSIS — M79604 Pain in right leg: Secondary | ICD-10-CM | POA: Diagnosis not present

## 2022-01-11 DIAGNOSIS — M542 Cervicalgia: Secondary | ICD-10-CM | POA: Diagnosis not present

## 2022-01-11 DIAGNOSIS — R531 Weakness: Secondary | ICD-10-CM | POA: Diagnosis not present

## 2022-01-11 DIAGNOSIS — R262 Difficulty in walking, not elsewhere classified: Secondary | ICD-10-CM | POA: Diagnosis not present

## 2022-04-30 IMAGING — DX DG CHEST 2V
2 series · 2 of 2 positions shown · non-contrast
Comparison: 04/27/2016

CLINICAL DATA: Cough, rhonchi

EXAM:
CHEST - 2 VIEW

[chest pa]
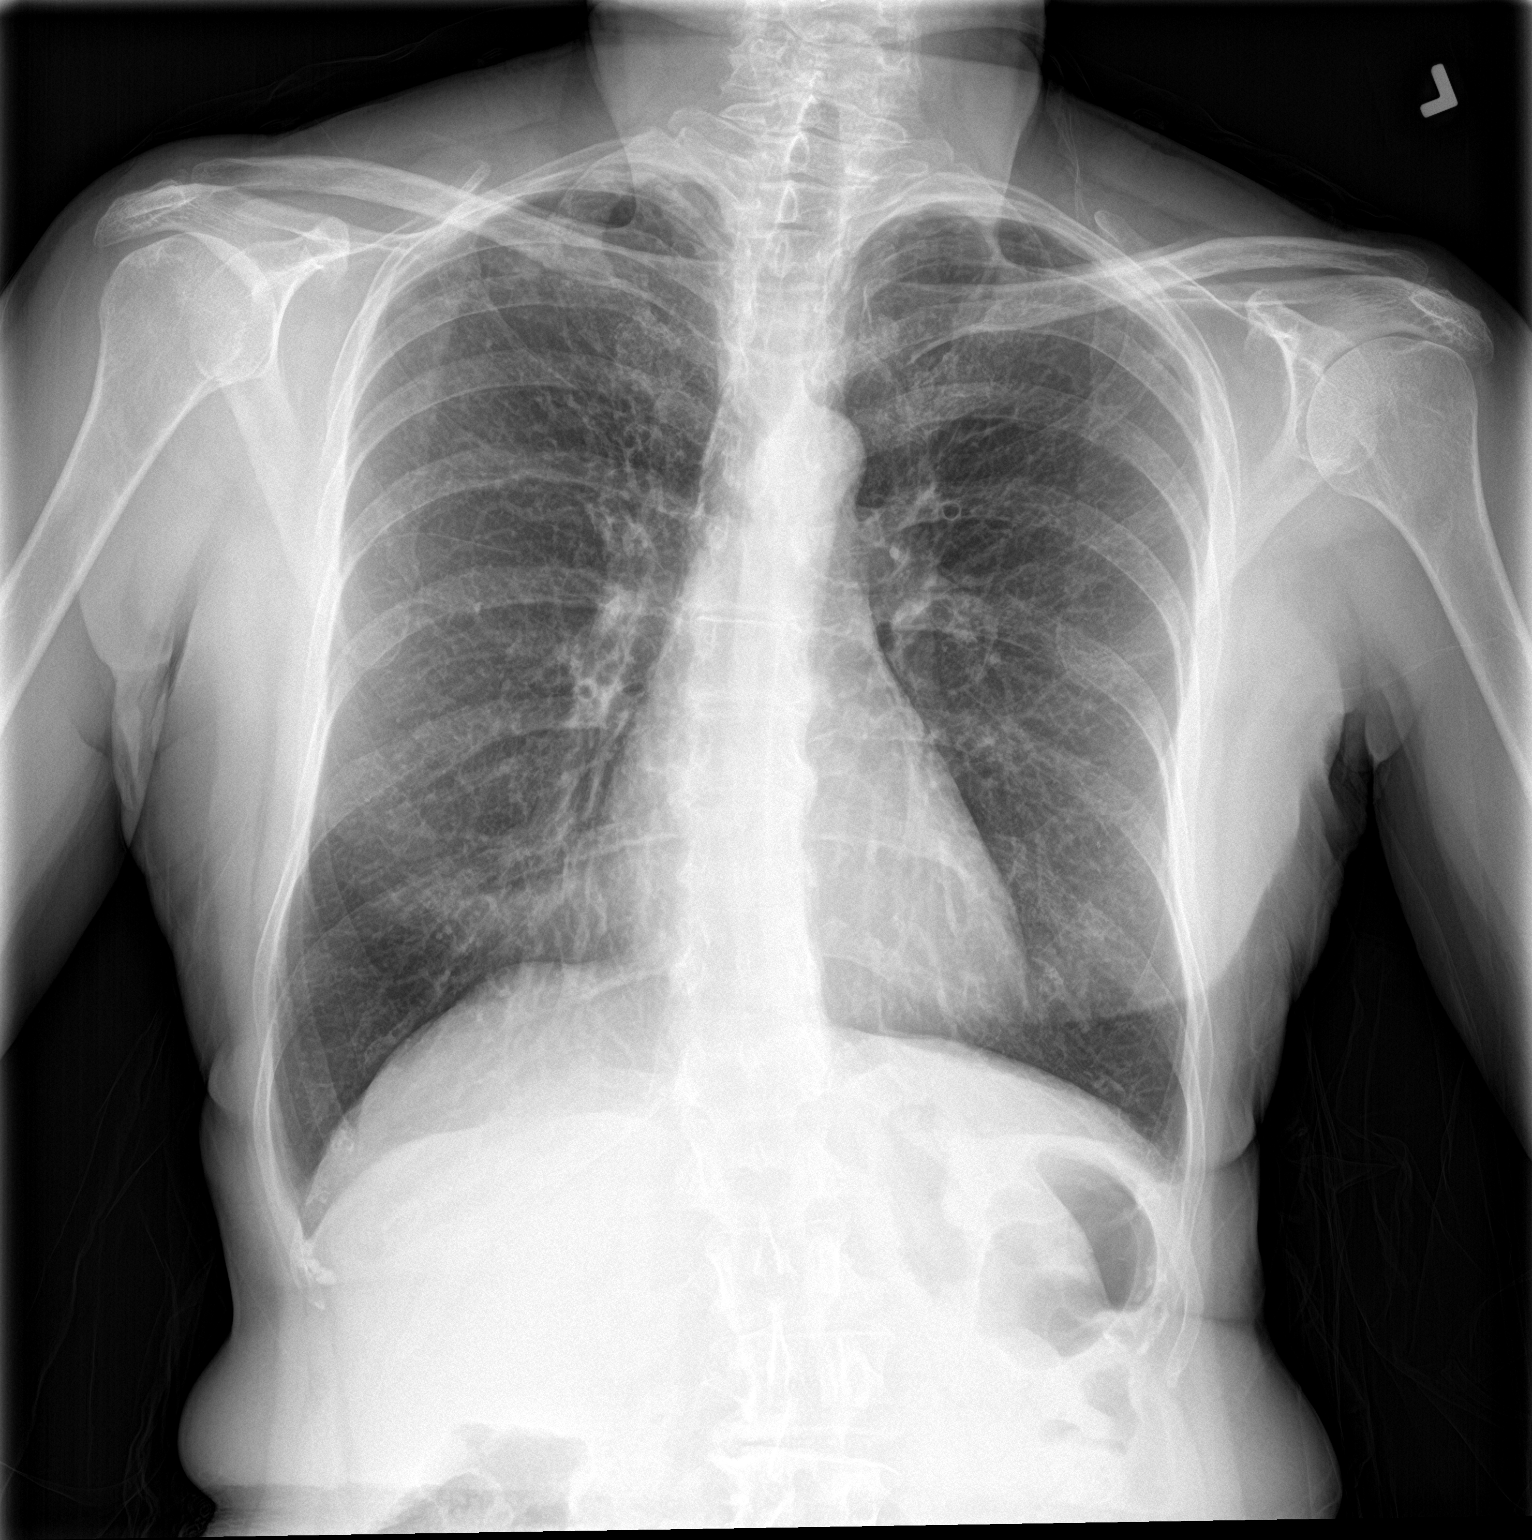

[chest lat]
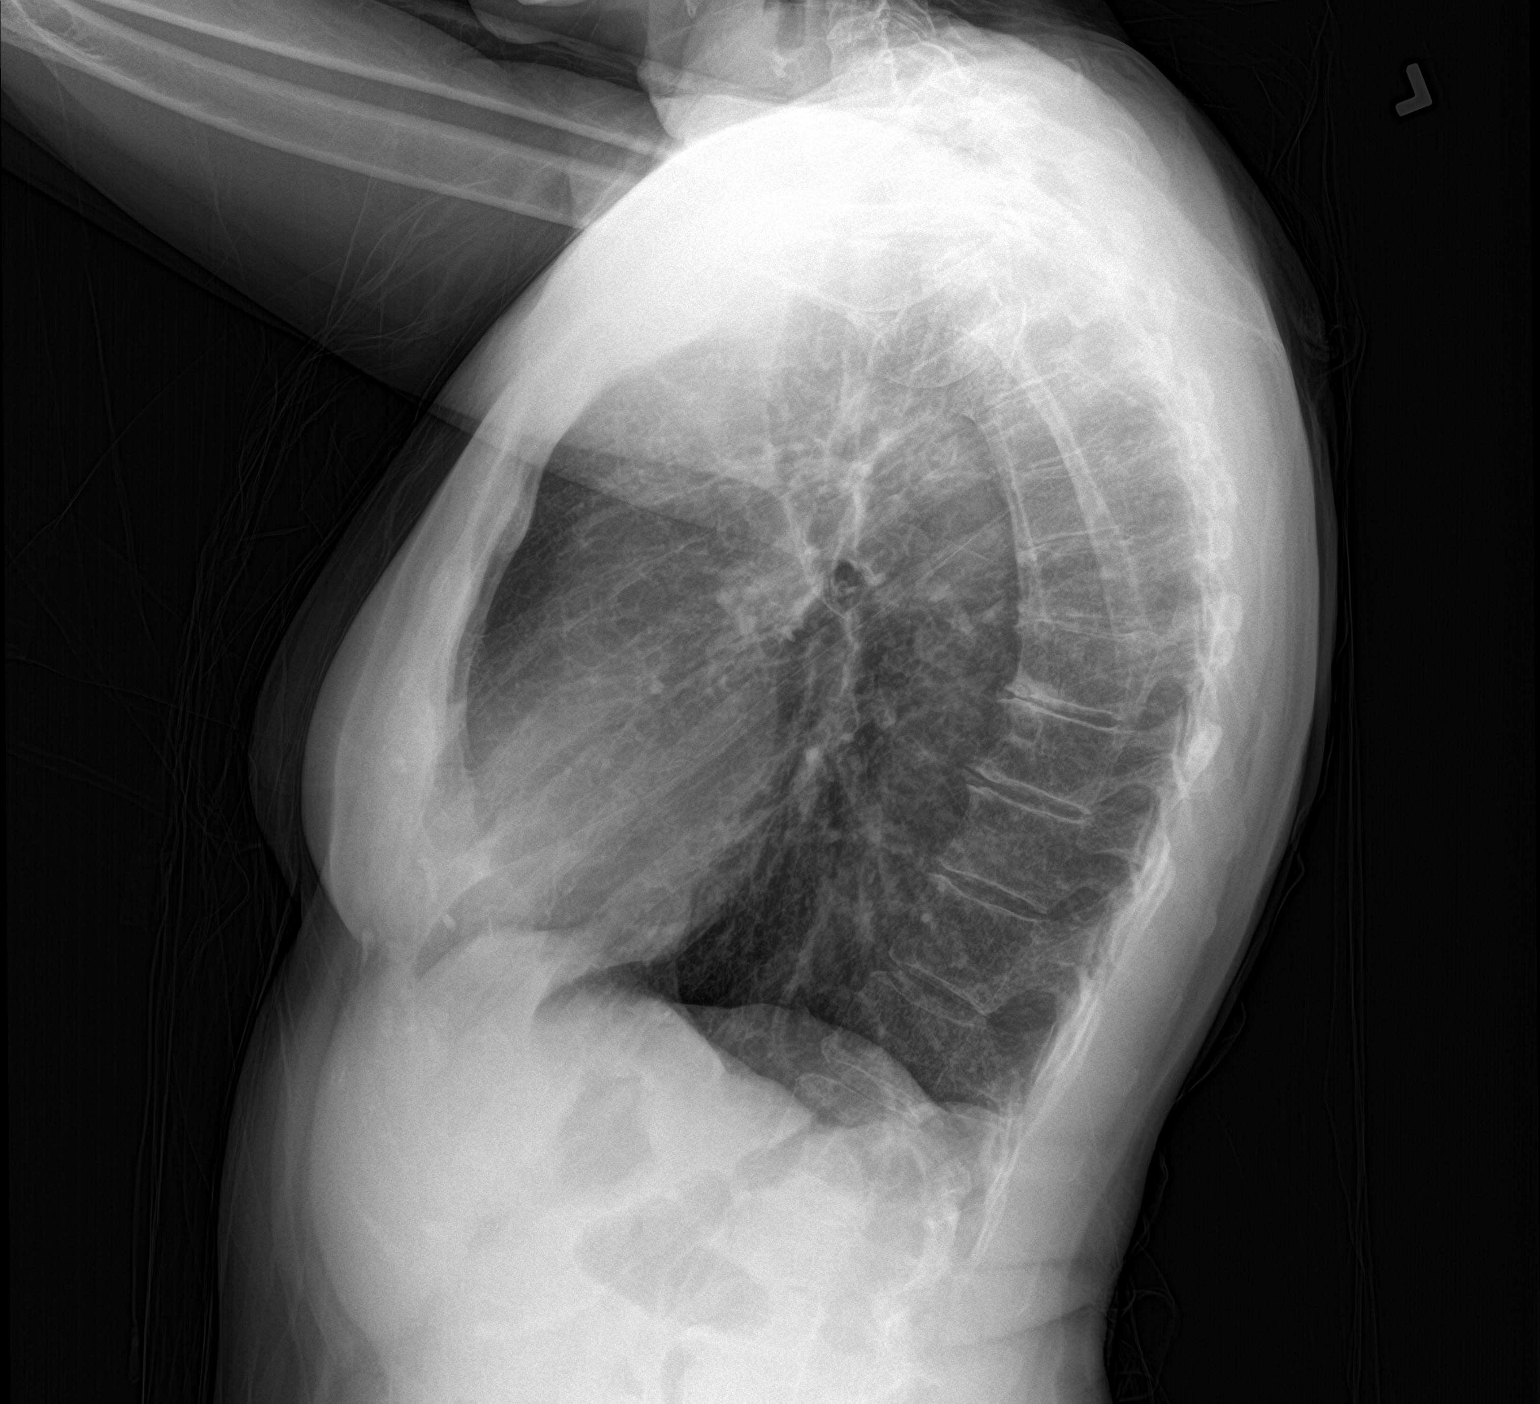

[2 of 2 positions shown; findings below may reference images not displayed]

FINDINGS: The lungs appear mildly hyperinflated suggesting changes of
underlying COPD, stable since prior examination. No superimposed
focal pulmonary infiltrate. Mild biapical scarring. No pneumothorax
or pleural effusion. Cardiac size within normal limits. Pulmonary
vascularity is normal. No acute bone abnormality.
IMPRESSION: No active cardiopulmonary disease.  COPD.

## 2022-05-06 ENCOUNTER — Encounter: Payer: Self-pay | Admitting: Adult Health

## 2022-05-06 ENCOUNTER — Ambulatory Visit (INDEPENDENT_AMBULATORY_CARE_PROVIDER_SITE_OTHER): Payer: PPO | Admitting: Adult Health

## 2022-05-06 VITALS — BP 120/88 | HR 76 | Temp 98.0°F | Ht 60.5 in | Wt 113.0 lb

## 2022-05-06 DIAGNOSIS — Z720 Tobacco use: Secondary | ICD-10-CM

## 2022-05-06 DIAGNOSIS — F32A Depression, unspecified: Secondary | ICD-10-CM

## 2022-05-06 DIAGNOSIS — Z0000 Encounter for general adult medical examination without abnormal findings: Secondary | ICD-10-CM | POA: Diagnosis not present

## 2022-05-06 DIAGNOSIS — M3500 Sicca syndrome, unspecified: Secondary | ICD-10-CM

## 2022-05-06 DIAGNOSIS — M810 Age-related osteoporosis without current pathological fracture: Secondary | ICD-10-CM | POA: Diagnosis not present

## 2022-05-06 DIAGNOSIS — F419 Anxiety disorder, unspecified: Secondary | ICD-10-CM

## 2022-05-06 DIAGNOSIS — M069 Rheumatoid arthritis, unspecified: Secondary | ICD-10-CM

## 2022-05-06 DIAGNOSIS — Z1211 Encounter for screening for malignant neoplasm of colon: Secondary | ICD-10-CM
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
## 2022-05-06 NOTE — Patient Instructions (Signed)
It was great seeing you today   We will follow up with you regarding your lab work; please schedule your lab appointment at the front desk   Please let me know if you need anything

## 2022-05-13 ENCOUNTER — Other Ambulatory Visit (INDEPENDENT_AMBULATORY_CARE_PROVIDER_SITE_OTHER): Payer: PPO

## 2022-05-13 DIAGNOSIS — M3500 Sicca syndrome, unspecified: Secondary | ICD-10-CM | POA: Diagnosis not present

## 2022-05-13 DIAGNOSIS — F32A Depression, unspecified: Secondary | ICD-10-CM

## 2022-05-13 DIAGNOSIS — M810 Age-related osteoporosis without current pathological fracture: Secondary | ICD-10-CM

## 2022-05-13 DIAGNOSIS — F419 Anxiety disorder, unspecified: Secondary | ICD-10-CM

## 2022-05-13 DIAGNOSIS — M069 Rheumatoid arthritis, unspecified: Secondary | ICD-10-CM

## 2022-05-13 LAB — VITAMIN D 25 HYDROXY (VIT D DEFICIENCY, FRACTURES): VITD: 46.13 ng/mL (ref 30.00–100.00)

## 2022-05-13 LAB — LIPID PANEL
Cholesterol: 239 mg/dL — ABNORMAL HIGH (ref 0–200)
HDL: 75.8 mg/dL (ref >39.00)
LDL Cholesterol: 138 mg/dL — ABNORMAL HIGH (ref 0–99)
NonHDL: 162.93
Total CHOL/HDL Ratio: 3
Triglycerides: 124 mg/dL (ref 0.0–149.0)
VLDL: 24.8 mg/dL (ref 0.0–40.0)

## 2022-05-13 LAB — CBC WITH DIFFERENTIAL/PLATELET
Basophils Absolute: 0.1 10*3/uL (ref 0.0–0.1)
Basophils Relative: 0.8 % (ref 0.0–3.0)
Eosinophils Absolute: 0.2 10*3/uL (ref 0.0–0.7)
Eosinophils Relative: 2.3 % (ref 0.0–5.0)
HCT: 42.5 % (ref 36.0–46.0)
Hemoglobin: 14.6 g/dL (ref 12.0–15.0)
Lymphocytes Relative: 27 % (ref 12.0–46.0)
Lymphs Abs: 1.8 10*3/uL (ref 0.7–4.0)
MCHC: 34.3 g/dL (ref 30.0–36.0)
MCV: 91.2 fl (ref 78.0–100.0)
Monocytes Absolute: 0.7 10*3/uL (ref 0.1–1.0)
Monocytes Relative: 9.7 % (ref 3.0–12.0)
Neutro Abs: 4.1 10*3/uL (ref 1.4–7.7)
Neutrophils Relative %: 60.2 % (ref 43.0–77.0)
Platelets: 334 10*3/uL (ref 150.0–400.0)
RBC: 4.66 Mil/uL (ref 3.87–5.11)
RDW: 12.1 % (ref 11.5–15.5)
WBC: 6.7 10*3/uL (ref 4.0–10.5)

## 2022-05-13 LAB — COMPREHENSIVE METABOLIC PANEL
ALT: 13 U/L (ref 0–35)
AST: 18 U/L (ref 0–37)
Albumin: 4.7 g/dL (ref 3.5–5.2)
Alkaline Phosphatase: 52 U/L (ref 39–117)
BUN: 11 mg/dL (ref 6–23)
CO2: 31 mEq/L (ref 19–32)
Calcium: 10.7 mg/dL — ABNORMAL HIGH (ref 8.4–10.5)
Chloride: 99 mEq/L (ref 96–112)
Creatinine, Ser: 0.82 mg/dL (ref 0.40–1.20)
GFR: 73.47 mL/min (ref >60.00)
Glucose, Bld: 92 mg/dL (ref 70–99)
Potassium: 3.7 mEq/L (ref 3.5–5.1)
Sodium: 138 mEq/L (ref 135–145)
Total Bilirubin: 0.6 mg/dL (ref 0.2–1.2)
Total Protein: 8.2 g/dL (ref 6.0–8.3)

## 2022-05-13 LAB — TSH: TSH: 2.87 u[IU]/mL (ref 0.35–5.50)

## 2022-05-13 LAB — C-REACTIVE PROTEIN: CRP: <1 mg/dL (ref 0.5–20.0)

## 2022-05-14 ENCOUNTER — Other Ambulatory Visit: Payer: Self-pay

## 2022-05-14 ENCOUNTER — Other Ambulatory Visit: Payer: Self-pay | Admitting: Adult Health

## 2022-05-14 DIAGNOSIS — E782 Mixed hyperlipidemia: Secondary | ICD-10-CM

## 2022-05-14 MED ORDER — ATORVASTATIN CALCIUM 10 MG PO TABS: 10.0000 mg | ORAL_TABLET | Freq: Every day | ORAL | 1 refills | Status: DC

## 2022-05-18 DIAGNOSIS — Z79899 Other long term (current) drug therapy: Secondary | ICD-10-CM | POA: Diagnosis not present

## 2022-05-18 DIAGNOSIS — M7060 Trochanteric bursitis, unspecified hip: Secondary | ICD-10-CM | POA: Diagnosis not present

## 2022-05-18 DIAGNOSIS — M810 Age-related osteoporosis without current pathological fracture: Secondary | ICD-10-CM | POA: Diagnosis not present

## 2022-05-18 DIAGNOSIS — M3500 Sicca syndrome, unspecified: Secondary | ICD-10-CM | POA: Diagnosis not present

## 2022-05-18 DIAGNOSIS — E559 Vitamin D deficiency, unspecified: Secondary | ICD-10-CM | POA: Diagnosis not present

## 2022-05-18 DIAGNOSIS — M0579 Rheumatoid arthritis with rheumatoid factor of multiple sites without organ or systems involvement: Secondary | ICD-10-CM | POA: Diagnosis not present

## 2022-05-18 DIAGNOSIS — M549 Dorsalgia, unspecified: Secondary | ICD-10-CM | POA: Diagnosis not present

## 2022-05-18 DIAGNOSIS — M359 Systemic involvement of connective tissue, unspecified: Secondary | ICD-10-CM | POA: Diagnosis not present

## 2022-06-01 ENCOUNTER — Other Ambulatory Visit: Payer: Self-pay | Admitting: Adult Health

## 2022-06-01 LAB — COLOGUARD

## 2022-06-02 ENCOUNTER — Telehealth: Payer: Self-pay | Admitting: Adult Health

## 2022-06-02 NOTE — Telephone Encounter (Signed)
Contacted Andrea Fields to schedule their annual wellness visit. Appointment made for 06/09/22.  Andrea Fields AWV direct phone # [phone]

## 2022-06-07 ENCOUNTER — Ambulatory Visit
Admission: RE | Admit: 2022-06-07 | Discharge: 2022-06-07 | Disposition: A | Discharge status: Home / Self Care | Payer: PPO | Source: Ambulatory Visit | Attending: Emergency Medicine | Admitting: Emergency Medicine

## 2022-06-07 VITALS — BP 127/76 | HR 74 | Temp 98.2°F | Resp 14 | Ht 60.5 in | Wt 112.0 lb

## 2022-06-07 DIAGNOSIS — L237 Allergic contact dermatitis due to plants, except food: Secondary | ICD-10-CM

## 2022-06-07 MED ORDER — HYDROXYZINE HCL 25 MG PO TABS: 25.0000 mg | ORAL_TABLET | Freq: Four times a day (QID) | ORAL | 0 refills | Status: AC | PRN

## 2022-06-07 MED ORDER — PREDNISONE 10 MG PO TABS: ORAL_TABLET | ORAL | 0 refills | Status: DC

## 2022-06-07 MED ORDER — TRIAMCINOLONE ACETONIDE 0.1 % EX CREA: 1.0000 | TOPICAL_CREAM | Freq: Two times a day (BID) | CUTANEOUS | 0 refills | Status: AC

## 2022-06-07 NOTE — ED Provider Notes (Signed)
HPI  SUBJECTIVE:  Andrea Fields is a 67 y.o. female who presents with an intensely pruritic, vesicular rash of her hands, neck, chest after being exposed to poison ivy 5 days ago.  The rash started 4 days ago.  Started on her hands and has spread to her chest and neck.  she reports clear yellow oozing along her hands and chest.  No fevers, surrounding erythema or pain.  Some mild swelling of the right middle finger.  She has tried calamine, Aveeno with hydrocortisone, hydrocortisone with aloe, cool compresses without improvement in her symptoms.  No aggravating factors.  She has a past medical history of Sjogren's syndrome, rheumatoid arthritis and osteoporosis.  She is not on chronic steroids.    Past Medical History:  Diagnosis Date   Anemia    pt does not recall having this, CBC 05/2015 normal   Anxiety and depression 07/03/2015   -sees psychiatry, [facility]    Chronic low back pain 05/13/2014   DCIS (ductal carcinoma in situ)    1992 dx during pregnancy, s/p lumpectomy remotely and no further treatment; benign biopsy in 2002   Left ankle sprain    w/ lat mal avulsion fx in 2017; saw Dr. Victorino Dike   Osteoporosis 07/03/2015   -seeing Dr. Kathi Ludwig -did not tolerate boniva    Rheumatoid arthritis(714.0)    Dx in 2017, seeing Dr. Kathi Ludwig, Rheumatologist   Sjogren's syndrome [facility])    Sees Rheumatologist    Past Surgical History:  Procedure Laterality Date   ABCESS DRAINAGE  2012   sinus   BREAST BIOPSY     BREAST LUMPECTOMY Right 1992   DCIS   BREAST SURGERY  1992, 2002   biopsy   LAPAROSCOPY  1990   laparoscopy     MOUTH RANULA EXCISION  1996   ORIF FOOT FRACTURE  2007   left 5th metatarsal   PTOSIS REPAIR Bilateral 06/2021    Family History  Problem Relation Age of Onset   Hyperlipidemia Mother    Dementia Mother    Parkinson's disease Mother    Diabetes Mother    Hyperlipidemia Father    Heart block Father    Heart disease Father    Osteoarthritis Father    Other Father         optic ischemia   Hypertension Father    Benign prostatic hyperplasia Father    Cancer Maternal Aunt        breast   Dementia Maternal Grandmother    Osteoarthritis Maternal Grandmother    Osteoarthritis Maternal Grandfather    Diabetes Paternal Grandmother    Arthritis Paternal Grandfather    Heart disease Paternal Grandfather    Asthma Sister    Osteoarthritis Sister    Sleep apnea Brother    Other Sister        detached retina   Osteoarthritis Sister    Healthy Brother    Healthy Brother    Anxiety disorder Daughter     Social History   Tobacco Use   Smoking status: Every Day    Packs/day: .25    Types: Cigarettes   Smokeless tobacco: Never  Vaping Use   Vaping Use: Never used  Substance Use Topics   Alcohol use: Yes    Alcohol/week: 7.0 - 10.0 standard drinks of alcohol    Types: 7 - 10 Glasses of wine per week    Comment: weekly   Drug use: No    No current facility-administered medications for this encounter.  Current Outpatient Medications:    denosumab (PROLIA) 60 MG/ML SOLN injection, Inject 60 mg into the skin every 6 (six) months. Administer in upper arm, thigh, or abdomen, Disp: , Rfl:    hydrOXYzine (ATARAX) 25 MG tablet, Take 1 tablet (25 mg total) by mouth every 6 (six) hours as needed for up to 7 days for itching., Disp: 28 tablet, Rfl: 0   predniSONE (DELTASONE) 10 MG tablet, 6 tabs on day 1-2, 5 tabs on day 3-4, 4 tabs on day 5-6, 3 tabs on day 7-8, 2 tabs day 9-10, 1 tab day 11-12, Disp: 42 tablet, Rfl: 0   triamcinolone cream (KENALOG) 0.1 %, Apply 1 Application topically 2 (two) times daily. Apply for 2 weeks. May use on face, Disp: 30 g, Rfl: 0   acetaminophen (TYLENOL) 500 MG tablet, 2 tablets as needed, Disp: , Rfl:    Ascorbic Acid (VITAMIN C) 1000 MG tablet, Take 1,000 mg by mouth daily., Disp: , Rfl:    atorvastatin (LIPITOR) 10 MG tablet, Take 1 tablet (10 mg total) by mouth daily., Disp: 90 tablet, Rfl: 1   Cholecalciferol (VITAMIN D  PO), Take 2,000 Units by mouth daily. , Disp: , Rfl:    cyanocobalamin 100 MCG tablet, Take 100 mcg by mouth daily. (Patient not taking: Reported on 06/07/2022), Disp: , Rfl:    cycloSPORINE (RESTASIS) 0.05 % ophthalmic emulsion, PLACE 1 DROP IN BOTH EYE TWICE DAILY, Disp: , Rfl:    DULoxetine (CYMBALTA) 60 MG capsule, TAKE 1 CAPSULE BY MOUTH EVERY DAY, Disp: 90 capsule, Rfl: 1   fish oil-omega-3 fatty acids 1000 MG capsule, Take 2 g by mouth daily., Disp: , Rfl:    hydroxychloroquine (PLAQUENIL) 200 MG tablet, Take 1 tablet by mouth daily., Disp: , Rfl:    Multiple Vitamin (MULTIVITAMIN) tablet, Take 1 tablet by mouth daily., Disp: , Rfl:    naproxen sodium (ANAPROX) 220 MG tablet, Take 220 mg by mouth as needed. , Disp: , Rfl:    Probiotic Product (PROBIOTIC DAILY PO), Take 1 capsule by mouth daily., Disp: , Rfl:   Allergies  Allergen Reactions   Azithromycin     Joint pain   Boniva [Ibandronic Acid]    Minocycline Hcl Other (See Comments)   Vectrin [Minocycline Hcl]      ROS  As noted in HPI.   Physical Exam  BP 127/76 (BP Location: Left Arm)   Pulse 74   Temp 98.2 F (36.8 C) (Oral)   Resp 14   Ht 5' 0.5" (1.537 m)   Wt 50.8 kg   SpO2 96%   BMI 21.51 kg/m   Constitutional: Well developed, well nourished, no acute distress Eyes:  EOMI, conjunctiva normal bilaterally HENT: Normocephalic, atraumatic,mucus membranes moist Respiratory: Normal inspiratory effort Cardiovascular: Normal rate GI: nondistended skin: Nontender vesicular rash over right middle finger, right index finger, right chest and neck no surrounding erythema, expressible purulent drainage          Musculoskeletal: no deformities Neurologic: Alert & oriented x 3, no focal neuro deficits Psychiatric: Speech and behavior appropriate   ED Course   Medications - No data to display  No orders of the defined types were placed in this encounter.   No results found for this or any previous visit  (from the past 24 hour(s)). No results found.  ED Clinical Impression  1. Contact dermatitis due to poison ivy      ED Assessment/Plan     Patient presents with a contact dermatitis from  poison ivy.  Will send her home with 12 days of prednisone, triamcinolone cream.  Will have her start Claritin or Zyrtec, and if that does not work, then Atarax.  She will return here for any signs of infection.  Discussed MDM, treatment plan, and plan for follow-up with patient.  patient agrees with plan.   Meds ordered this encounter  Medications   predniSONE (DELTASONE) 10 MG tablet    Sig: 6 tabs on day 1-2, 5 tabs on day 3-4, 4 tabs on day 5-6, 3 tabs on day 7-8, 2 tabs day 9-10, 1 tab day 11-12    Dispense:  42 tablet    Refill:  0   hydrOXYzine (ATARAX) 25 MG tablet    Sig: Take 1 tablet (25 mg total) by mouth every 6 (six) hours as needed for up to 7 days for itching.    Dispense:  28 tablet    Refill:  0   triamcinolone cream (KENALOG) 0.1 %    Sig: Apply 1 Application topically 2 (two) times daily. Apply for 2 weeks. May use on face    Dispense:  30 g    Refill:  0      *This clinic note was created using Scientist, clinical (histocompatibility and immunogenetics). Therefore, there may be occasional mistakes despite careful proofreading.  ?    Domenick Gong, MD 06/07/22 1302

## 2022-06-07 NOTE — ED Triage Notes (Signed)
Poison Ivy exposure last Wednesday  Rash started late Thursday night  Rash to chest, bilateral hands OTC calamine, Aveeno w/ hydrocortisone, cold packs - no relief

## 2022-06-07 NOTE — Discharge Instructions (Signed)
Use TecNu before going out in areas with known poison ivy/oak.  This will help prevent you from getting poison ivy/oak.  If you get a rash, you can use Zanfel or TecNu extreme to deactivate the oil, which will stop the rash from spreading and help with the itching.  Apply antibacterial ointment on scabbed areas to help prevent infection if the skin breaks.  If you were given steroids, make sure you finish all of them.  You may take Claritin or Zyrtec, and if that does not work, then Atarax.  Dissolve 1 packet (or tablet) of Domeboro (aluminum acetate) in 1 pint of luke-warm water. Soak the affected areas with luke-warm Domeboro solution for 5-10 minutes twice daily. You may use apply gauze soaked in the domeboro.  Gently pat dry, Then apply the steriod / antibiotic cream. You may also take oatmeal baths with Aveeno oatmeal (1 cup in half full bathtub) or cornstarch/baking soda (1 cup each in half full bathtub). To prevent the oatmeal from caking in pipes, place it in a tied sock before dropping it into the bathtub.  Go to www.goodrx.com to look up your medications. This will give you a list of where you can find your prescriptions at the most affordable prices. Or ask the pharmacist what the cash price is, or if they have any other discount programs available to help make your medication more affordable. This can be less expensive than what you would pay with insurance.

## 2022-06-09 ENCOUNTER — Ambulatory Visit (INDEPENDENT_AMBULATORY_CARE_PROVIDER_SITE_OTHER): Payer: PPO

## 2022-06-09 VITALS — Ht 61.5 in | Wt 113.0 lb

## 2022-06-09 DIAGNOSIS — Z122 Encounter for screening for malignant neoplasm of respiratory organs: Secondary | ICD-10-CM

## 2022-06-09 DIAGNOSIS — Z0000 Encounter for general adult medical examination without abnormal findings: Secondary | ICD-10-CM
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
## 2022-06-09 NOTE — Patient Instructions (Addendum)
[name] , Thank you for taking time to come for your Medicare Wellness Visit. I appreciate your ongoing commitment to your health goals. Please review the following plan we discussed and let me know if I can assist you in the future.   These are the goals we discussed:  Goals       Patient Stated      04/27/2021, training for marathon      Stay Healthy (pt-stated)      Continue to be a good Mom!        This is a list of the screening recommended for you and due dates:  Health Maintenance  Topic Date Due   COVID-19 Vaccine (4 - 2023-24 season) 06/25/2022*   Flu Shot  09/02/2022   Cologuard (Stool DNA test)  05/12/2023   Medicare Annual Wellness Visit  06/09/2023   Mammogram  12/16/2023   DTaP/Tdap/Td vaccine (2 - Td or Tdap) 05/12/2024   Pneumonia Vaccine  Completed   DEXA scan (bone density measurement)  Completed   Zoster (Shingles) Vaccine  Completed   Hepatitis C Screening: USPSTF Recommendation to screen - Ages 69-79 yo.  Addressed   HPV Vaccine  Aged Out  *Topic was postponed. The date shown is not the original due date.    Advanced directives: Please bring a copy of your health care power of attorney and living will to the office to be added to your chart at your convenience.   Conditions/risks identified: None  Next appointment: Follow up in one year for your annual wellness visit    Preventive Care 65 Years and Older, Female Preventive care refers to lifestyle choices and visits with your health care provider that can promote health and wellness. What does preventive care include? A yearly physical exam. This is also called an annual well check. Dental exams once or twice a year. Routine eye exams. Ask your health care provider how often you should have your eyes checked. Personal lifestyle choices, including: Daily care of your teeth and gums. Regular physical activity. Eating a healthy diet. Avoiding tobacco and drug use. Limiting alcohol  use. Practicing safe sex. Taking low-dose aspirin every day. Taking vitamin and mineral supplements as recommended by your health care provider. What happens during an annual well check? The services and screenings done by your health care provider during your annual well check will depend on your age, overall health, lifestyle risk factors, and family history of disease. Counseling  Your health care provider may ask you questions about your: Alcohol use. Tobacco use. Drug use. Emotional well-being. Home and relationship well-being. Sexual activity. Eating habits. History of falls. Memory and ability to understand (cognition). Work and work Astronomer. Reproductive health. Screening  You may have the following tests or measurements: Height, weight, and BMI. Blood pressure. Lipid and cholesterol levels. These may be checked every 5 years, or more frequently if you are over 54 years old. Skin check. Lung cancer screening. You may have this screening every year starting at age 69 if you have a 30-pack-year history of smoking and currently smoke or have quit within the past 15 years. Fecal occult blood test (FOBT) of the stool. You may have this test every year starting at age 69. Flexible sigmoidoscopy or colonoscopy. You may have a sigmoidoscopy every 5 years or a colonoscopy every 10 years starting at age 30. Hepatitis C blood test. Hepatitis B blood test. Sexually transmitted disease (STD) testing. Diabetes screening. This is done by checking your blood sugar (  glucose) after you have not eaten for a while (fasting). You may have this done every 1-3 years. Bone density scan. This is done to screen for osteoporosis. You may have this done starting at age 69. Mammogram. This may be done every 1-2 years. Talk to your health care provider about how often you should have regular mammograms. Talk with your health care provider about your test results, treatment options, and if necessary,  the need for more tests. Vaccines  Your health care provider may recommend certain vaccines, such as: Influenza vaccine. This is recommended every year. Tetanus, diphtheria, and acellular pertussis (Tdap, Td) vaccine. You may need a Td booster every 10 years. Zoster vaccine. You may need this after age 69. Pneumococcal 13-valent conjugate (PCV13) vaccine. One dose is recommended after age 69. Pneumococcal polysaccharide (PPSV23) vaccine. One dose is recommended after age 69. Talk to your health care provider about which screenings and vaccines you need and how often you need them. This information is not intended to replace advice given to you by your health care provider. Make sure you discuss any questions you have with your health care provider. Document Released: 02/14/2015 Document Revised: 10/08/2015 Document Reviewed: 11/19/2014 Elsevier Interactive Patient Education  2017 [place] Prevention in the Home Falls can cause injuries. They can happen to people of all ages. There are many things you can do to make your home safe and to help prevent falls. What can I do on the outside of my home? Regularly fix the edges of walkways and driveways and fix any cracks. Remove anything that might make you trip as you walk through a door, such as a raised step or threshold. Trim any bushes or trees on the path to your home. Use bright outdoor lighting. Clear any walking paths of anything that might make someone trip, such as rocks or tools. Regularly check to see if handrails are loose or broken. Make sure that both sides of any steps have handrails. Any raised decks and porches should have guardrails on the edges. Have any leaves, snow, or ice cleared regularly. Use sand or salt on walking paths during winter. Clean up any spills in your garage right away. This includes oil or grease spills. What can I do in the bathroom? Use night lights. Install grab bars by the toilet and in the  tub and shower. Do not use towel bars as grab bars. Use non-skid mats or decals in the tub or shower. If you need to sit down in the shower, use a plastic, non-slip stool. Keep the floor dry. Clean up any water that spills on the floor as soon as it happens. Remove soap buildup in the tub or shower regularly. Attach bath mats securely with double-sided non-slip rug tape. Do not have throw rugs and other things on the floor that can make you trip. What can I do in the bedroom? Use night lights. Make sure that you have a light by your bed that is easy to reach. Do not use any sheets or blankets that are too big for your bed. They should not hang down onto the floor. Have a firm chair that has side arms. You can use this for support while you get dressed. Do not have throw rugs and other things on the floor that can make you trip. What can I do in the kitchen? Clean up any spills right away. Avoid walking on wet floors. Keep items that you use a lot in easy-to-reach places.  If you need to reach something above you, use a strong step stool that has a grab bar. Keep electrical cords out of the way. Do not use floor polish or wax that makes floors slippery. If you must use wax, use non-skid floor wax. Do not have throw rugs and other things on the floor that can make you trip. What can I do with my stairs? Do not leave any items on the stairs. Make sure that there are handrails on both sides of the stairs and use them. Fix handrails that are broken or loose. Make sure that handrails are as long as the stairways. Check any carpeting to make sure that it is firmly attached to the stairs. Fix any carpet that is loose or worn. Avoid having throw rugs at the top or bottom of the stairs. If you do have throw rugs, attach them to the floor with carpet tape. Make sure that you have a light switch at the top of the stairs and the bottom of the stairs. If you do not have them, ask someone to add them for  you. What else can I do to help prevent falls? Wear shoes that: Do not have high heels. Have rubber bottoms. Are comfortable and fit you well. Are closed at the toe. Do not wear sandals. If you use a stepladder: Make sure that it is fully opened. Do not climb a closed stepladder. Make sure that both sides of the stepladder are locked into place. Ask someone to hold it for you, if possible. Clearly mark and make sure that you can see: Any grab bars or handrails. First and last steps. Where the edge of each step is. Use tools that help you move around (mobility aids) if they are needed. These include: Canes. Walkers. Scooters. Crutches. Turn on the lights when you go into a dark area. Replace any light bulbs as soon as they burn out. Set up your furniture so you have a clear path. Avoid moving your furniture around. If any of your floors are uneven, fix them. If there are any pets around you, be aware of where they are. Review your medicines with your doctor. Some medicines can make you feel dizzy. This can increase your chance of falling. Ask your doctor what other things that you can do to help prevent falls. This information is not intended to replace advice given to you by your health care provider. Make sure you discuss any questions you have with your health care provider. Document Released: 11/14/2008 Document Revised: 06/26/2015 Document Reviewed: 02/22/2014 Elsevier Interactive Patient Education  2017 [employer].

## 2022-06-15 ENCOUNTER — Other Ambulatory Visit: Payer: Self-pay | Admitting: Rheumatology

## 2022-06-15 DIAGNOSIS — M8580 Other specified disorders of bone density and structure, unspecified site: Secondary | ICD-10-CM

## 2022-06-25 ENCOUNTER — Other Ambulatory Visit: Payer: Self-pay | Admitting: Adult Health

## 2022-08-09 ENCOUNTER — Telehealth: Payer: Self-pay | Admitting: Adult Health

## 2022-08-09 NOTE — Telephone Encounter (Signed)
Patient called stating her insurance denied her AWV claim on 06/09/22, stating services not payable they way it was billed.  Patient is aware someone will contact her back

## 2022-08-10 ENCOUNTER — Other Ambulatory Visit: Payer: PPO

## 2022-08-13 ENCOUNTER — Ambulatory Visit: Payer: PPO | Admitting: Podiatry

## 2022-08-13 ENCOUNTER — Encounter: Payer: Self-pay | Admitting: Podiatry

## 2022-08-13 DIAGNOSIS — M21612 Bunion of left foot: Secondary | ICD-10-CM

## 2022-08-13 DIAGNOSIS — M3500 Sicca syndrome, unspecified: Secondary | ICD-10-CM | POA: Diagnosis not present

## 2022-08-13 DIAGNOSIS — M21611 Bunion of right foot: Secondary | ICD-10-CM | POA: Diagnosis not present

## 2022-08-13 DIAGNOSIS — M2041 Other hammer toe(s) (acquired), right foot: Secondary | ICD-10-CM | POA: Diagnosis not present

## 2022-08-13 DIAGNOSIS — M069 Rheumatoid arthritis, unspecified: Secondary | ICD-10-CM | POA: Diagnosis not present

## 2022-08-13 DIAGNOSIS — D2371 Other benign neoplasm of skin of right lower limb, including hip: Secondary | ICD-10-CM

## 2022-08-13 NOTE — Progress Notes (Signed)
  Subjective:  Patient ID: Andrea Fields, female    DOB: Jul 20, 1955,   MRN: [id number]  Chief Complaint  Patient presents with   Nail Problem     Routine foot care/ possible nail fungus    Callouses     Bilateral callus trim     67 y.o. female presents for concern of painful lesions of bilateral feet and concern for nail changes as well. She has a history or RA and Sjogren syndrome.  Denies any other pedal complaints. Denies n/v/f/c.   Past Medical History:  Diagnosis Date   Anemia    pt does not recall having this, CBC 05/2015 normal   Anxiety and depression 07/03/2015   -sees psychiatry, [facility]    Chronic low back pain 05/13/2014   DCIS (ductal carcinoma in situ)    1992 dx during pregnancy, s/p lumpectomy remotely and no further treatment; benign biopsy in 2002   Left ankle sprain    w/ lat mal avulsion fx in 2017; saw Dr. Victorino Dike   Osteoporosis 07/03/2015   -seeing Dr. Kathi Ludwig -did not tolerate boniva    Rheumatoid arthritis(714.0)    Dx in 2017, seeing Dr. Kathi Ludwig, Rheumatologist   Sjogren's syndrome [facility])    Sees Rheumatologist    Objective:  Physical Exam: Vascular: DP/PT pulses 2/4 bilateral. CFT <3 seconds. Normal hair growth on digits. No edema.  Skin. No lacerations or abrasions bilateral feet. Hyperkeratotic cored lesion noted to plantar ball of right foot. Disruption of skin lines and tenderness. Pincer nail deformity noted.  Musculoskeletal: MMT 5/5 bilateral lower extremities in DF, PF, Inversion and Eversion. Deceased ROM in DF of ankle joint. Bilateral bunion deformities noted to be moderate to sever. Right second digit hammered.  Neurological: Sensation intact to light touch.   Assessment:   1. Rheumatoid arthritis involving multiple sites, unspecified whether rheumatoid factor present (HCC)   2. Sjogren's syndrome, with unspecified organ involvement (HCC)   3. Benign neoplasm of skin of foot, right   4. Bilateral bunions   5. Hammertoe of right foot       Plan:  Patient was evaluated and treated and all questions answered. -Discussed benign skin lesions with patient and treatment options.  -Hyperkeratotic tissue was debrided with chisel without incident.  -Applied salycylic acid treatment to area with dressing. Advised to remove bandaging tomorrow.  -Encouraged daily moisturizing -Discussed use of pumice stone -Advised good supportive shoes and inserts -Xrays reviewed -Discussed HAV and hammertoes in RA foot.  and treatment options;conservative and surgical management; risks, benefits, alternatives discussed. All patient's questions answered. -Discussed padding and wide shoe gear.   -Recommend continue with good supportive shoes and inserts.  -Discussed surgical options. Would not recommend at this time.  -Patient to return to office as needed or sooner if condition worsens.  Louann Sjogren, DPM

## 2022-08-17 DIAGNOSIS — E559 Vitamin D deficiency, unspecified: Secondary | ICD-10-CM | POA: Diagnosis not present

## 2022-08-17 DIAGNOSIS — M0579 Rheumatoid arthritis with rheumatoid factor of multiple sites without organ or systems involvement: Secondary | ICD-10-CM | POA: Diagnosis not present

## 2022-08-17 DIAGNOSIS — M79671 Pain in right foot: Secondary | ICD-10-CM | POA: Diagnosis not present

## 2022-08-17 DIAGNOSIS — M79643 Pain in unspecified hand: Secondary | ICD-10-CM | POA: Diagnosis not present

## 2022-08-17 DIAGNOSIS — M359 Systemic involvement of connective tissue, unspecified: Secondary | ICD-10-CM | POA: Diagnosis not present

## 2022-08-17 DIAGNOSIS — M3500 Sicca syndrome, unspecified: Secondary | ICD-10-CM | POA: Diagnosis not present

## 2022-08-17 DIAGNOSIS — M810 Age-related osteoporosis without current pathological fracture: Secondary | ICD-10-CM | POA: Diagnosis not present

## 2022-08-17 DIAGNOSIS — Z79899 Other long term (current) drug therapy: Secondary | ICD-10-CM | POA: Diagnosis not present

## 2022-09-08 DIAGNOSIS — M7989 Other specified soft tissue disorders: Secondary | ICD-10-CM | POA: Diagnosis not present

## 2022-10-21 ENCOUNTER — Ambulatory Visit
Admission: RE | Admit: 2022-10-21 | Discharge: 2022-10-21 | Disposition: A | Discharge status: Home / Self Care | Payer: PPO | Source: Ambulatory Visit

## 2022-10-21 ENCOUNTER — Other Ambulatory Visit: Payer: Self-pay

## 2022-10-21 ENCOUNTER — Telehealth: Payer: Self-pay | Admitting: Internal Medicine

## 2022-10-21 VITALS — BP 108/67 | HR 73 | Temp 97.8°F | Resp 16

## 2022-10-21 DIAGNOSIS — S76112A Strain of left quadriceps muscle, fascia and tendon, initial encounter: Secondary | ICD-10-CM

## 2022-10-21 MED ORDER — BACLOFEN 5 MG PO TABS: 5.0000 mg | ORAL_TABLET | Freq: Three times a day (TID) | ORAL | 0 refills | Status: DC | PRN

## 2022-10-21 NOTE — Discharge Instructions (Signed)
Your pain is likely due to a muscle strain which will improve on its own with time.   - You may take over the counter medicines to help with pain.  - If you were given a shot of pain medicine in the clinic today, you may start taking ibuprofen/other NSAIDs in 12-24 hours. - You may also take the prescribed muscle relaxer as directed as needed for muscle aches/spasm.  Do not take this medication and drive or drink alcohol as it can make you sleepy.  Mainly use this medicine at nighttime as needed. - Apply heat 20 minutes on then 20 minutes off and perform gentle range of motion exercises to the area of greatest pain to prevent muscle stiffness and provide further pain relief.   Red flag symptoms to watch out for are numbness/tingling to the legs, weakness, loss of bowel/bladder control, and/or worsening pain that does not respond well to medicines. Follow-up with your primary care provider or return to urgent care if your symptoms do not improve in the next 3 to 4 days with medications and interventions recommended today. If your symptoms are severe (red flag), please go to the emergency room.

## 2022-10-21 NOTE — ED Triage Notes (Signed)
Yesterday had onset of left upper leg pain. Reports did heavy lifting on Monday night. Denies any bulging area to groin.

## 2022-10-21 NOTE — Telephone Encounter (Signed)
Medication sent to pharmacy as discussed in today's encounter.

## 2022-10-21 NOTE — ED Provider Notes (Signed)
[facility]    CSN: [id number] Arrival date & time: 10/21/22  0943      History   Chief Complaint Chief Complaint  Patient presents with   Leg Pain    Pulled muscle - Entered by patient    HPI Andrea Fields is a 67 y.o. female.   Andrea Fields is a 67 y.o. female presenting for chief complaint of pain to the left upper anterior thigh that started a couple of days ago after she lifted a heavy object with the left leg in a strange position.Pain began gradually and has worsened since onset over the last 48 hours. Pain starts to the left groin region and radiates distally down the left quadriceps muscle. Pain is mostly triggered by movement and improves with walking/heat. She was able to walk 5 miles yesterday with her dog and states when her muscles were warm, the pain improved significantly. No numbness/tingling to the bilateral lower extremities, saddle anesthesia, low back pain, recent falls, urinary symptoms, abdominal pain, fever/chills, or extremity weakness. She is taking OTC aleve and this has been helping with pain.    Leg Pain   Past Medical History:  Diagnosis Date   Anemia    pt does not recall having this, CBC 05/2015 normal   Anxiety and depression 07/03/2015   -sees psychiatry, [facility]    Chronic low back pain 05/13/2014   DCIS (ductal carcinoma in situ)    1992 dx during pregnancy, s/p lumpectomy remotely and no further treatment; benign biopsy in 2002   Left ankle sprain    w/ lat mal avulsion fx in 2017; saw Dr. Victorino Dike   Osteoporosis 07/03/2015   -seeing Dr. Kathi Ludwig -did not tolerate boniva    Rheumatoid arthritis(714.0)    Dx in 2017, seeing Dr. Kathi Ludwig, Rheumatologist   Sjogren's syndrome [facility])    Sees Rheumatologist    Patient Active Problem List   Diagnosis Date Noted   Fibromyalgia 04/14/2020   Vitamin D deficiency 04/14/2020   Poor sleep 05/17/2016   Osteoporosis 07/03/2015   Anxiety and depression 07/03/2015   Rotator cuff syndrome of  right shoulder 06/13/2014   Chronic low back pain 05/13/2014   Rheumatoid arthritis (HCC) 05/13/2014   Sjogren's disease (HCC) 12/08/2011   History of open reduction and internal fixation (ORIF) procedure 05/02/2005   DCIS (ductal carcinoma in situ) of breast 11/02/1990    Past Surgical History:  Procedure Laterality Date   ABCESS DRAINAGE  2012   sinus   BREAST BIOPSY     BREAST LUMPECTOMY Right 1992   DCIS   BREAST SURGERY  1992, 2002   biopsy   LAPAROSCOPY  1990   laparoscopy     MOUTH RANULA EXCISION  1996   ORIF FOOT FRACTURE  2007   left 5th metatarsal   PTOSIS REPAIR Bilateral 06/2021    OB History     Gravida  1   Para  1   Term  1   Preterm      AB      Living  1      SAB      IAB      Ectopic      Multiple      Live Births               Home Medications    Prior to Admission medications   Medication Sig Start Date End Date Taking? Authorizing Provider  magnesium oxide (MAG-OX) 400 (240 Mg) MG  tablet Take 400 mg by mouth daily.   Yes [provider]  acetaminophen (TYLENOL) 500 MG tablet 2 tablets as needed    [provider]  Ascorbic Acid (VITAMIN C) 1000 MG tablet Take 1,000 mg by mouth daily.    [provider]  atorvastatin (LIPITOR) 10 MG tablet Take 1 tablet (10 mg total) by mouth daily. 05/14/22   Nafziger, Kandee Keen, NP  Baclofen 5 MG TABS Take 1 tablet (5 mg total) by mouth every 8 (eight) hours as needed. 10/21/22   Carlisle Beers, FNP  Cholecalciferol (VITAMIN D PO) Take 2,000 Units by mouth daily.     [provider]  cyanocobalamin 100 MCG tablet Take 100 mcg by mouth daily. Patient not taking: Reported on 06/07/2022    [provider]  cycloSPORINE (RESTASIS) 0.05 % ophthalmic emulsion PLACE 1 DROP IN BOTH EYE TWICE DAILY    [provider]  denosumab (PROLIA) 60 MG/ML SOLN injection Inject 60 mg into the skin every 6 (six) months. Administer in upper arm, thigh, or  abdomen    [provider]  DULoxetine (CYMBALTA) 60 MG capsule TAKE 1 CAPSULE BY MOUTH EVERY DAY 06/25/22   Nafziger, Kandee Keen, NP  fish oil-omega-3 fatty acids 1000 MG capsule Take 2 g by mouth daily.    [provider]  hydroxychloroquine (PLAQUENIL) 200 MG tablet Take 1 tablet by mouth daily.    [provider]  Multiple Vitamin (MULTIVITAMIN) tablet Take 1 tablet by mouth daily.    [provider]  naproxen sodium (ANAPROX) 220 MG tablet Take 220 mg by mouth as needed.     [provider]  predniSONE (DELTASONE) 10 MG tablet 6 tabs on day 1-2, 5 tabs on day 3-4, 4 tabs on day 5-6, 3 tabs on day 7-8, 2 tabs day 9-10, 1 tab day 11-12 06/07/22   Domenick Gong, MD  Probiotic Product (PROBIOTIC DAILY PO) Take 1 capsule by mouth daily.    [provider]  triamcinolone cream (KENALOG) 0.1 % Apply 1 Application topically 2 (two) times daily. Apply for 2 weeks. May use on face 06/07/22   Domenick Gong, MD    Family History Family History  Problem Relation Age of Onset   Hyperlipidemia Mother    Dementia Mother    Parkinson's disease Mother    Diabetes Mother    Hyperlipidemia Father    Heart block Father    Heart disease Father    Osteoarthritis Father    Other Father        optic ischemia   Hypertension Father    Benign prostatic hyperplasia Father    Cancer Maternal Aunt        breast   Dementia Maternal Grandmother    Osteoarthritis Maternal Grandmother    Osteoarthritis Maternal Grandfather    Diabetes Paternal Grandmother    Arthritis Paternal Grandfather    Heart disease Paternal Grandfather    Asthma Sister    Osteoarthritis Sister    Sleep apnea Brother    Other Sister        detached retina   Osteoarthritis Sister    Healthy Brother    Healthy Brother    Anxiety disorder Daughter     Social History Social History   Tobacco Use   Smoking status: Every Day    Current packs/day: 0.25    Types: Cigarettes    Smokeless tobacco: Never  Vaping Use   Vaping status: Never Used  Substance Use Topics  Alcohol use: Yes    Alcohol/week: 7.0 - 10.0 standard drinks of alcohol    Types: 7 - 10 Glasses of wine per week    Comment: weekly   Drug use: No     Allergies   Azithromycin, Boniva [ibandronic acid], Minocycline hcl, and Vectrin [minocycline hcl]   Review of Systems Review of Systems Per HPI  Physical Exam Triage Vital Signs ED Triage Vitals  Encounter Vitals Group     BP 10/21/22 1000 108/67     Systolic BP Percentile --      Diastolic BP Percentile --      Pulse Rate 10/21/22 1000 73     Resp 10/21/22 1000 16     Temp 10/21/22 1000 97.8 F (36.6 C)     Temp src --      SpO2 10/21/22 1000 99 %     Weight --      Height --      Head Circumference --      Peak Flow --      Pain Score 10/21/22 1001 2     Pain Loc --      Pain Education --      Exclude from Growth Chart --    No data found.  Updated Vital Signs BP 108/67   Pulse 73   Temp 97.8 F (36.6 C)   Resp 16   SpO2 99%   Visual Acuity Right Eye Distance:   Left Eye Distance:   Bilateral Distance:    Right Eye Near:   Left Eye Near:    Bilateral Near:     Physical Exam Vitals and nursing note reviewed.  Constitutional:      Appearance: She is not ill-appearing or toxic-appearing.  HENT:     Head: Normocephalic and atraumatic.     Right Ear: Hearing and external ear normal.     Left Ear: Hearing and external ear normal.     Nose: Nose normal.     Mouth/Throat:     Lips: Pink.  Eyes:     General: Lids are normal. Vision grossly intact. Gaze aligned appropriately.     Extraocular Movements: Extraocular movements intact.     Conjunctiva/sclera: Conjunctivae normal.  Pulmonary:     Effort: Pulmonary effort is normal.  Musculoskeletal:     Cervical back: Neck supple.     Right hip: Normal.     Left hip: Normal.     Right upper leg: Normal.     Left upper leg: Normal.     Right knee: Normal.      Left knee: Normal.     Comments: No redness, swelling, warmth, or tenderness to palpation to the left upper extremity/left thigh. Strength and sensation intact to bilateral lower extremities distally. +2 bilateral popliteal pulses.  Skin:    General: Skin is warm and dry.     Capillary Refill: Capillary refill takes less than 2 seconds.     Findings: No rash.  Neurological:     General: No focal deficit present.     Mental Status: She is alert and oriented to person, place, and time. Mental status is at baseline.     Cranial Nerves: No dysarthria or facial asymmetry.  Psychiatric:        Mood and Affect: Mood normal.        Speech: Speech normal.        Behavior: Behavior normal.        Thought Content: Thought content normal.  Judgment: Judgment normal.      UC Treatments / Results  Labs (all labs ordered are listed, but only abnormal results are displayed) Labs Reviewed - No data to display  EKG   Radiology No results found.  Procedures Procedures (including critical care time)  Medications Ordered in UC Medications - No data to display  Initial Impression / Assessment and Plan / UC Course  I have reviewed the triage vital signs and the nursing notes.  Pertinent labs & imaging results that were available during my care of the patient were reviewed by me and considered in my medical decision making (see chart for details).   1. Strain of left quadriceps muscle Evaluation suggests pain is musculoskeletal in nature. Will manage this with rest, gentle ROM exercises, heat therapy, tylenol as needed for pain, and as needed use of muscle relaxer. Drowsiness precautions discussed regarding muscle relaxer use. Imaging: no indication for imaging based on stable musculoskeletal exam findings May follow-up with orthopedics as needed. Low suspicion for DVT etiology.   Counseled patient on potential for adverse effects with medications prescribed/recommended today, strict ER  and return-to-clinic precautions discussed, patient verbalized understanding.    Final Clinical Impressions(s) / UC Diagnoses   Final diagnoses:  Strain of left quadriceps muscle, initial encounter     Discharge Instructions      Your pain is likely due to a muscle strain which will improve on its own with time.   - You may take over the counter medicines to help with pain.  - If you were given a shot of pain medicine in the clinic today, you may start taking ibuprofen/other NSAIDs in 12-24 hours. - You may also take the prescribed muscle relaxer as directed as needed for muscle aches/spasm.  Do not take this medication and drive or drink alcohol as it can make you sleepy.  Mainly use this medicine at nighttime as needed. - Apply heat 20 minutes on then 20 minutes off and perform gentle range of motion exercises to the area of greatest pain to prevent muscle stiffness and provide further pain relief.   Red flag symptoms to watch out for are numbness/tingling to the legs, weakness, loss of bowel/bladder control, and/or worsening pain that does not respond well to medicines. Follow-up with your primary care provider or return to urgent care if your symptoms do not improve in the next 3 to 4 days with medications and interventions recommended today. If your symptoms are severe (red flag), please go to the emergency room.        ED Prescriptions   None    PDMP not reviewed this encounter.   Carlisle Beers, [place] 10/21/22 1348

## 2022-11-10 ENCOUNTER — Other Ambulatory Visit: Payer: PPO

## 2022-11-10 DIAGNOSIS — M79643 Pain in unspecified hand: Secondary | ICD-10-CM | POA: Diagnosis not present

## 2022-11-10 DIAGNOSIS — M810 Age-related osteoporosis without current pathological fracture: Secondary | ICD-10-CM | POA: Diagnosis not present

## 2022-11-10 DIAGNOSIS — Z23 Encounter for immunization: Secondary | ICD-10-CM | POA: Diagnosis not present

## 2022-11-10 DIAGNOSIS — M79671 Pain in right foot: Secondary | ICD-10-CM | POA: Diagnosis not present

## 2022-11-10 DIAGNOSIS — M359 Systemic involvement of connective tissue, unspecified: Secondary | ICD-10-CM | POA: Diagnosis not present

## 2022-11-10 DIAGNOSIS — E559 Vitamin D deficiency, unspecified: Secondary | ICD-10-CM | POA: Diagnosis not present

## 2022-11-10 DIAGNOSIS — M0579 Rheumatoid arthritis with rheumatoid factor of multiple sites without organ or systems involvement: Secondary | ICD-10-CM | POA: Diagnosis not present

## 2022-11-10 DIAGNOSIS — Z79899 Other long term (current) drug therapy: Secondary | ICD-10-CM | POA: Diagnosis not present

## 2022-11-10 DIAGNOSIS — M3500 Sicca syndrome, unspecified: Secondary | ICD-10-CM | POA: Diagnosis not present

## 2022-11-15 ENCOUNTER — Other Ambulatory Visit: Payer: Self-pay | Admitting: Adult Health

## 2022-11-15 DIAGNOSIS — Z1231 Encounter for screening mammogram for malignant neoplasm of breast: Secondary | ICD-10-CM

## 2022-11-17 ENCOUNTER — Other Ambulatory Visit: Payer: Self-pay | Admitting: Adult Health

## 2022-12-09 DIAGNOSIS — Z1211 Encounter for screening for malignant neoplasm of colon: Secondary | ICD-10-CM | POA: Diagnosis not present

## 2022-12-17 LAB — COLOGUARD: COLOGUARD: NEGATIVE

## 2022-12-20 ENCOUNTER — Ambulatory Visit: Payer: PPO

## 2022-12-20 ENCOUNTER — Ambulatory Visit
Admission: RE | Admit: 2022-12-20 | Discharge: 2022-12-20 | Disposition: A | Discharge status: Home / Self Care | Payer: PPO | Source: Ambulatory Visit | Attending: Adult Health

## 2022-12-20 DIAGNOSIS — Z1231 Encounter for screening mammogram for malignant neoplasm of breast: Secondary | ICD-10-CM | POA: Diagnosis not present

## 2022-12-22 ENCOUNTER — Other Ambulatory Visit: Payer: Self-pay | Admitting: Adult Health

## 2023-01-07 ENCOUNTER — Other Ambulatory Visit: Payer: PPO

## 2023-01-12 DIAGNOSIS — Z79899 Other long term (current) drug therapy: Secondary | ICD-10-CM | POA: Diagnosis not present

## 2023-01-12 DIAGNOSIS — H2513 Age-related nuclear cataract, bilateral: Secondary | ICD-10-CM | POA: Diagnosis not present

## 2023-01-12 DIAGNOSIS — H5213 Myopia, bilateral: Secondary | ICD-10-CM | POA: Diagnosis not present

## 2023-01-18 ENCOUNTER — Encounter: Payer: Self-pay | Admitting: Adult Health

## 2023-01-18 ENCOUNTER — Ambulatory Visit (INDEPENDENT_AMBULATORY_CARE_PROVIDER_SITE_OTHER): Payer: PPO | Admitting: Adult Health

## 2023-01-18 VITALS — BP 110/80 | HR 80 | Temp 98.1°F | Ht 61.5 in | Wt 113.0 lb

## 2023-01-18 DIAGNOSIS — Z86000 Personal history of in-situ neoplasm of breast: Secondary | ICD-10-CM

## 2023-01-18 DIAGNOSIS — F5101 Primary insomnia: Secondary | ICD-10-CM

## 2023-01-18 MED ORDER — TRAZODONE HCL 50 MG PO TABS
25.0000 mg | ORAL_TABLET | Freq: Every evening | ORAL | 3 refills | Status: DC | PRN
Start: 2023-01-18 — End: 2023-02-11
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
## 2023-02-09 ENCOUNTER — Ambulatory Visit: Payer: PPO

## 2023-02-09 DIAGNOSIS — Z08 Encounter for follow-up examination after completed treatment for malignant neoplasm: Secondary | ICD-10-CM

## 2023-02-09 DIAGNOSIS — I700 Atherosclerosis of aorta: Secondary | ICD-10-CM | POA: Diagnosis not present

## 2023-02-09 DIAGNOSIS — Z86000 Personal history of in-situ neoplasm of breast: Secondary | ICD-10-CM

## 2023-02-10 ENCOUNTER — Other Ambulatory Visit: Payer: Self-pay | Admitting: Adult Health

## 2023-02-10 DIAGNOSIS — F5101 Primary insomnia: Secondary | ICD-10-CM

## 2023-02-28 DIAGNOSIS — M0579 Rheumatoid arthritis with rheumatoid factor of multiple sites without organ or systems involvement: Secondary | ICD-10-CM | POA: Diagnosis not present

## 2023-02-28 DIAGNOSIS — M79671 Pain in right foot: Secondary | ICD-10-CM | POA: Diagnosis not present

## 2023-02-28 DIAGNOSIS — M79643 Pain in unspecified hand: Secondary | ICD-10-CM | POA: Diagnosis not present

## 2023-02-28 DIAGNOSIS — M359 Systemic involvement of connective tissue, unspecified: Secondary | ICD-10-CM | POA: Diagnosis not present

## 2023-02-28 DIAGNOSIS — Z79899 Other long term (current) drug therapy: Secondary | ICD-10-CM | POA: Diagnosis not present

## 2023-02-28 DIAGNOSIS — M3500 Sicca syndrome, unspecified: Secondary | ICD-10-CM | POA: Diagnosis not present

## 2023-02-28 DIAGNOSIS — E559 Vitamin D deficiency, unspecified: Secondary | ICD-10-CM | POA: Diagnosis not present

## 2023-02-28 DIAGNOSIS — M810 Age-related osteoporosis without current pathological fracture: Secondary | ICD-10-CM | POA: Diagnosis not present

## 2023-03-07 DIAGNOSIS — F320 Major depressive disorder, single episode, mild: Secondary | ICD-10-CM | POA: Diagnosis not present

## 2023-03-23 DIAGNOSIS — F320 Major depressive disorder, single episode, mild: Secondary | ICD-10-CM | POA: Diagnosis not present

## 2023-03-24 DIAGNOSIS — F411 Generalized anxiety disorder: Secondary | ICD-10-CM | POA: Diagnosis not present

## 2023-04-04 ENCOUNTER — Other Ambulatory Visit: Payer: Self-pay | Admitting: Adult Health

## 2023-04-06 DIAGNOSIS — F330 Major depressive disorder, recurrent, mild: Secondary | ICD-10-CM | POA: Diagnosis not present

## 2023-04-13 ENCOUNTER — Ambulatory Visit
Admission: RE | Admit: 2023-04-13 | Discharge: 2023-04-13 | Disposition: A | Discharge status: Home / Self Care | Source: Ambulatory Visit | Attending: Family Medicine | Admitting: Family Medicine

## 2023-04-13 VITALS — BP 89/58 | HR 85 | Temp 99.6°F | Resp 16

## 2023-04-13 DIAGNOSIS — R059 Cough, unspecified: Secondary | ICD-10-CM | POA: Diagnosis not present

## 2023-04-13 DIAGNOSIS — J101 Influenza due to other identified influenza virus with other respiratory manifestations: Secondary | ICD-10-CM

## 2023-04-13 LAB — POCT INFLUENZA A/B
Influenza A, POC: POSITIVE — AB
Influenza B, POC: NEGATIVE

## 2023-04-13 LAB — POC SARS CORONAVIRUS 2 AG -  ED: SARS Coronavirus 2 Ag: NEGATIVE

## 2023-04-13 NOTE — ED Provider Notes (Signed)
 [facility]    CSN: [id number] Arrival date & time: 04/13/23  1105      History   Chief Complaint Chief Complaint  Patient presents with   URI    HPI Andrea Fields is a 68 y.o. female.   HPI 68 year old female presents with cough, congestion, postnasal drainage for 3 days.  Patient reports she will be going out of town and wants to be tested for flu.  PMH significant for rheumatoid arthritis, fibromyalgia, and rotator cuff syndrome of right shoulder.  Past Medical History:  Diagnosis Date   Anemia    pt does not recall having this, CBC 05/2015 normal   Anxiety and depression 07/03/2015   -sees psychiatry, [facility]    Chronic low back pain 05/13/2014   DCIS (ductal carcinoma in situ)    1992 dx during pregnancy, s/p lumpectomy remotely and no further treatment; benign biopsy in 2002   Left ankle sprain    w/ lat mal avulsion fx in 2017; saw Dr. Victorino Dike   Osteoporosis 07/03/2015   -seeing Dr. Kathi Ludwig -did not tolerate boniva    Rheumatoid arthritis(714.0)    Dx in 2017, seeing Dr. Kathi Ludwig, Rheumatologist   Sjogren's syndrome [facility])    Sees Rheumatologist    Patient Active Problem List   Diagnosis Date Noted   Fibromyalgia 04/14/2020   Vitamin D deficiency 04/14/2020   Poor sleep 05/17/2016   Osteoporosis 07/03/2015   Anxiety and depression 07/03/2015   Rotator cuff syndrome of right shoulder 06/13/2014   Chronic low back pain 05/13/2014   Rheumatoid arthritis (HCC) 05/13/2014   Sjogren's disease (HCC) 12/08/2011   History of open reduction and internal fixation (ORIF) procedure 05/02/2005   DCIS (ductal carcinoma in situ) of breast 11/02/1990    Past Surgical History:  Procedure Laterality Date   ABCESS DRAINAGE  2012   sinus   BREAST BIOPSY     BREAST LUMPECTOMY Right 1992   DCIS   BREAST SURGERY  1992, 2002   biopsy   LAPAROSCOPY  1990   laparoscopy     MOUTH RANULA EXCISION  1996   ORIF FOOT FRACTURE  2007   left 5th metatarsal   PTOSIS REPAIR  Bilateral 06/2021    OB History     Gravida  1   Para  1   Term  1   Preterm      AB      Living  1      SAB      IAB      Ectopic      Multiple      Live Births               Home Medications    Prior to Admission medications   Medication Sig Start Date End Date Taking? Authorizing Provider  acetaminophen (TYLENOL) 500 MG tablet 2 tablets as needed    [provider]  Ascorbic Acid (VITAMIN C) 1000 MG tablet Take 1,000 mg by mouth daily.    [provider]  atorvastatin (LIPITOR) 10 MG tablet TAKE 1 TABLET BY MOUTH EVERY DAY 04/06/23   Nafziger, Kandee Keen, NP  Cholecalciferol (VITAMIN D PO) Take 2,000 Units by mouth daily.     [provider]  cyanocobalamin 100 MCG tablet Take 100 mcg by mouth daily.    [provider]  cycloSPORINE (RESTASIS) 0.05 % ophthalmic emulsion PLACE 1 DROP IN BOTH EYE TWICE DAILY    [provider]  denosumab (PROLIA) 60  MG/ML SOLN injection Inject 60 mg into the skin every 6 (six) months. Administer in upper arm, thigh, or abdomen    [provider]  DULoxetine (CYMBALTA) 60 MG capsule TAKE 1 CAPSULE BY MOUTH EVERY DAY 12/23/22   Nafziger, Kandee Keen, NP  fish oil-omega-3 fatty acids 1000 MG capsule Take 2 g by mouth daily.    [provider]  hydroxychloroquine (PLAQUENIL) 200 MG tablet Take 1 tablet by mouth daily.    [provider]  magnesium oxide (MAG-OX) 400 (240 Mg) MG tablet Take 400 mg by mouth daily.    [provider]  Multiple Vitamin (MULTIVITAMIN) tablet Take 1 tablet by mouth daily.    [provider]  Naftifine HCl 2 % GEL 1 application to affected area Externally Once a day for 14 day(s)    [provider]  naproxen sodium (ANAPROX) 220 MG tablet Take 220 mg by mouth as needed.     [provider]  Probiotic Product (PROBIOTIC DAILY PO) Take 1 capsule by mouth daily.    [provider]  traZODone (DESYREL) 50 MG  tablet TAKE 0.5-1 TABLETS BY MOUTH AT BEDTIME AS NEEDED FOR SLEEP. 02/11/23   Nafziger, Kandee Keen, NP  triamcinolone cream (KENALOG) 0.1 % Apply 1 Application topically 2 (two) times daily. Apply for 2 weeks. May use on face 06/07/22   Domenick Gong, MD  Turmeric 500 MG CAPS as directed Orally    [provider]    Family History Family History  Problem Relation Age of Onset   Hyperlipidemia Mother    Dementia Mother    Parkinson's disease Mother    Diabetes Mother    Hyperlipidemia Father    Heart block Father    Heart disease Father    Osteoarthritis Father    Other Father        optic ischemia   Hypertension Father    Benign prostatic hyperplasia Father    Cancer Maternal Aunt        breast   Dementia Maternal Grandmother    Osteoarthritis Maternal Grandmother    Osteoarthritis Maternal Grandfather    Diabetes Paternal Grandmother    Arthritis Paternal Grandfather    Heart disease Paternal Grandfather    Asthma Sister    Osteoarthritis Sister    Sleep apnea Brother    Other Sister        detached retina   Osteoarthritis Sister    Healthy Brother    Healthy Brother    Anxiety disorder Daughter     Social History Social History   Tobacco Use   Smoking status: Every Day    Current packs/day: 0.25    Average packs/day: 0.3 packs/day for 40.0 years (10.0 ttl pk-yrs)    Types: Cigarettes    Start date: 04/13/1983   Smokeless tobacco: Never  Vaping Use   Vaping status: Never Used  Substance Use Topics   Alcohol use: Yes    Alcohol/week: 7.0 - 10.0 standard drinks of alcohol    Types: 7 - 10 Glasses of wine per week    Comment: weekly   Drug use: No     Allergies   Azithromycin, Boniva [ibandronate], Minocycline hcl, and Vectrin [minocycline hcl]   Review of Systems Review of Systems  Respiratory:  Positive for cough.   All other systems reviewed and are negative.    Physical Exam Triage Vital Signs ED Triage Vitals  Encounter Vitals Group      BP      Systolic  BP Percentile      Diastolic BP Percentile      Pulse      Resp      Temp      Temp src      SpO2      Weight      Height      Head Circumference      Peak Flow      Pain Score      Pain Loc      Pain Education      Exclude from Growth Chart    No data found.  Updated Vital Signs BP (!) 89/58   Pulse 85   Temp 99.6 F (37.6 C)   Resp 16   SpO2 98%   Physical Exam Vitals and nursing note reviewed.  Constitutional:      General: She is not in acute distress.    Appearance: Normal appearance. She is normal weight. She is ill-appearing. She is not toxic-appearing or diaphoretic.  HENT:     Head: Normocephalic and atraumatic.     Right Ear: Tympanic membrane, ear canal and external ear normal.     Left Ear: Tympanic membrane, ear canal and external ear normal.     Nose: Nose normal.     Mouth/Throat:     Mouth: Mucous membranes are moist.     Pharynx: Oropharynx is clear.  Eyes:     Extraocular Movements: Extraocular movements intact.     Conjunctiva/sclera: Conjunctivae normal.     Pupils: Pupils are equal, round, and reactive to light.  Cardiovascular:     Rate and Rhythm: Normal rate and regular rhythm.     Pulses: Normal pulses.     Heart sounds: Normal heart sounds.  Pulmonary:     Effort: Pulmonary effort is normal.     Breath sounds: Normal breath sounds. No wheezing, rhonchi or rales.  Musculoskeletal:        General: Normal range of motion.     Cervical back: Normal range of motion and neck supple.  Skin:    General: Skin is warm and dry.  Neurological:     General: No focal deficit present.     Mental Status: She is alert and oriented to person, place, and time. Mental status is at baseline.  Psychiatric:        Mood and Affect: Mood normal.        Behavior: Behavior normal.      UC Treatments / Results  Labs (all labs ordered are listed, but only abnormal results are displayed) Labs Reviewed  POCT INFLUENZA A/B - Abnormal;  Notable for the following components:      Result Value   Influenza A, POC Positive (*)    All other components within normal limits  POC SARS CORONAVIRUS 2 AG -  ED    EKG   Radiology No results found.  Procedures Procedures (including critical care time)  Medications Ordered in UC Medications - No data to display  Initial Impression / Assessment and Plan / UC Course  I have reviewed the triage vital signs and the nursing notes.  Pertinent labs & imaging results that were available during my care of the patient were reviewed by me and considered in my medical decision making (see chart for details).     MDM: 1.  Influenza A-patient declined Tamiflu and Xofluza; 2.  Cough, unspecified type-patient declined Tessalon capsules and other cough syrup. Patient advised Influenza A was positive today.  Advised  patient may take OTC Tylenol 1 g every 6 hours for fever (oral temperature greater than 100.3).  Encouraged increase daily water intake to 64 ounces per day 7 days/week.  Advised if symptoms worsen and/or unresolved please follow-up with your PCP for further evaluation.  Patient discharged home, hemodynamically stable. Final Clinical Impressions(s) / UC Diagnoses   Final diagnoses:  Cough, unspecified type  Influenza A     Discharge Instructions      Patient advised Influenza A was positive today.  Advised patient may take OTC Tylenol 1 g every 6 hours for fever (oral temperature greater than 100.3).  Encouraged increase daily water intake to 64 ounces per day 7 days/week.  Advised if symptoms worsen and/or unresolved please follow-up with your PCP for further evaluation.     ED Prescriptions   None    PDMP not reviewed this encounter.   Trevor Iha, FNP 04/13/23 1203

## 2023-04-13 NOTE — ED Triage Notes (Signed)
 Pt presents to uc with co of uri type symtpoms, congestion, raspy throat, drainage, cough,fevers  and soreness since Sunday the patient reports she should be going out of town soon so she wants to be tested for flu. She has been taking tylenol aleeve and musinex.

## 2023-04-13 NOTE — Discharge Instructions (Addendum)
 Patient advised Influenza A was positive today.  Advised patient may take OTC Tylenol 1 g every 6 hours for fever (oral temperature greater than 100.3).  Encouraged increase daily water intake to 64 ounces per day 7 days/week.  Advised if symptoms worsen and/or unresolved please follow-up with your PCP for further evaluation.

## 2023-04-18 ENCOUNTER — Encounter: Payer: Self-pay | Admitting: Family Medicine

## 2023-04-18 ENCOUNTER — Ambulatory Visit (INDEPENDENT_AMBULATORY_CARE_PROVIDER_SITE_OTHER): Admitting: Family Medicine

## 2023-04-18 VITALS — BP 118/70 | HR 80 | Temp 97.8°F | Resp 16 | Ht 61.5 in | Wt 109.2 lb

## 2023-04-18 DIAGNOSIS — J101 Influenza due to other identified influenza virus with other respiratory manifestations: Secondary | ICD-10-CM | POA: Diagnosis not present

## 2023-04-18 DIAGNOSIS — R051 Acute cough: Secondary | ICD-10-CM

## 2023-04-18 DIAGNOSIS — J989 Respiratory disorder, unspecified: Secondary | ICD-10-CM

## 2023-04-18 MED ORDER — ALBUTEROL SULFATE HFA 108 (90 BASE) MCG/ACT IN AERS: 2.0000 | INHALATION_SPRAY | Freq: Four times a day (QID) | RESPIRATORY_TRACT | 0 refills | Status: DC | PRN

## 2023-04-18 MED ORDER — HYDROCODONE BIT-HOMATROP MBR 5-1.5 MG/5ML PO SOLN
5.0000 mL | Freq: Two times a day (BID) | ORAL | 0 refills | Status: AC | PRN
Start: 2023-04-18 — End: 2023-04-28

## 2023-04-18 MED ORDER — BENZONATATE 100 MG PO CAPS: 100.0000 mg | ORAL_CAPSULE | Freq: Two times a day (BID) | ORAL | 0 refills | Status: AC | PRN

## 2023-04-18 MED ORDER — PREDNISONE 20 MG PO TABS: 40.0000 mg | ORAL_TABLET | Freq: Every day | ORAL | 0 refills | Status: AC
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
## 2023-04-18 NOTE — Patient Instructions (Addendum)
 A few things to remember from today's visit:  Acute cough - Plan: benzonatate (TESSALON) 100 MG capsule, HYDROcodone bit-homatropine (HYCODAN) 5-1.5 MG/5ML syrup  Influenza A  Reactive airway disease without asthma - Plan: albuterol (VENTOLIN HFA) 108 (90 Base) MCG/ACT inhaler, predniSONE (DELTASONE) 20 MG tablet  Albuterol inh 2 puff every 6 hours for a week then as needed for wheezing or shortness of breath.  Prednisone with breakfast.  Do not use My Chart to request refills or for acute issues that need immediate attention. If you send a my chart message, it may take a few days to be addressed, specially if I am not in the office.  Please be sure medication list is accurate. If a new problem present, please set up appointment sooner than planned today.

## 2023-04-20 DIAGNOSIS — F411 Generalized anxiety disorder: Secondary | ICD-10-CM | POA: Diagnosis not present

## 2023-04-20 DIAGNOSIS — F320 Major depressive disorder, single episode, mild: Secondary | ICD-10-CM | POA: Diagnosis not present

## 2023-05-10 ENCOUNTER — Encounter: Payer: PPO | Admitting: Adult Health

## 2023-05-19 DIAGNOSIS — F320 Major depressive disorder, single episode, mild: Secondary | ICD-10-CM | POA: Diagnosis not present

## 2023-06-07 DIAGNOSIS — M0579 Rheumatoid arthritis with rheumatoid factor of multiple sites without organ or systems involvement: Secondary | ICD-10-CM | POA: Diagnosis not present

## 2023-06-07 DIAGNOSIS — M25512 Pain in left shoulder: Secondary | ICD-10-CM | POA: Diagnosis not present

## 2023-06-07 DIAGNOSIS — M810 Age-related osteoporosis without current pathological fracture: Secondary | ICD-10-CM | POA: Diagnosis not present

## 2023-06-07 DIAGNOSIS — E559 Vitamin D deficiency, unspecified: Secondary | ICD-10-CM | POA: Diagnosis not present

## 2023-06-07 DIAGNOSIS — M25511 Pain in right shoulder: Secondary | ICD-10-CM | POA: Diagnosis not present

## 2023-06-07 DIAGNOSIS — Z79899 Other long term (current) drug therapy: Secondary | ICD-10-CM | POA: Diagnosis not present

## 2023-06-07 DIAGNOSIS — M542 Cervicalgia: Secondary | ICD-10-CM | POA: Diagnosis not present

## 2023-06-07 DIAGNOSIS — M3500 Sicca syndrome, unspecified: Secondary | ICD-10-CM | POA: Diagnosis not present

## 2023-06-07 LAB — LAB REPORT - SCANNED
EGFR: 83
PTH, Intact: 22

## 2023-06-16 ENCOUNTER — Other Ambulatory Visit: Payer: Self-pay | Admitting: Adult Health

## 2023-06-16 ENCOUNTER — Encounter: Payer: Self-pay | Admitting: Adult Health

## 2023-06-17 ENCOUNTER — Ambulatory Visit (INDEPENDENT_AMBULATORY_CARE_PROVIDER_SITE_OTHER): Admitting: Adult Health

## 2023-06-17 VITALS — BP 120/80 | HR 68 | Temp 98.0°F | Ht 61.5 in | Wt 107.0 lb

## 2023-06-17 DIAGNOSIS — M069 Rheumatoid arthritis, unspecified: Secondary | ICD-10-CM

## 2023-06-17 DIAGNOSIS — F32A Depression, unspecified: Secondary | ICD-10-CM | POA: Diagnosis not present

## 2023-06-17 DIAGNOSIS — Z0000 Encounter for general adult medical examination without abnormal findings: Secondary | ICD-10-CM | POA: Diagnosis not present

## 2023-06-17 DIAGNOSIS — E782 Mixed hyperlipidemia: Secondary | ICD-10-CM | POA: Diagnosis not present

## 2023-06-17 DIAGNOSIS — M810 Age-related osteoporosis without current pathological fracture: Secondary | ICD-10-CM | POA: Diagnosis not present

## 2023-06-17 DIAGNOSIS — F5101 Primary insomnia: Secondary | ICD-10-CM

## 2023-06-17 DIAGNOSIS — Z720 Tobacco use: Secondary | ICD-10-CM

## 2023-06-17 DIAGNOSIS — M3500 Sicca syndrome, unspecified: Secondary | ICD-10-CM | POA: Diagnosis not present

## 2023-06-17 DIAGNOSIS — F419 Anxiety disorder, unspecified: Secondary | ICD-10-CM | POA: Diagnosis not present

## 2023-06-17 LAB — LIPID PANEL
Cholesterol: 175 mg/dL (ref 0–200)
HDL: 68.1 mg/dL (ref >39.00)
LDL Cholesterol: 91 mg/dL (ref 0–99)
NonHDL: 107.06
Total CHOL/HDL Ratio: 3
Triglycerides: 82 mg/dL (ref 0.0–149.0)
VLDL: 16.4 mg/dL (ref 0.0–40.0)

## 2023-06-17 NOTE — Patient Instructions (Signed)
 It was great seeing you today   We will follow up with you regarding your lab work   Please let me know if you need anything
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
## 2023-06-20 ENCOUNTER — Encounter: Payer: Self-pay | Admitting: Adult Health

## 2023-06-21 ENCOUNTER — Ambulatory Visit: Payer: Self-pay | Admitting: Adult Health

## 2023-06-21 LAB — TSH: TSH: 1.89 u[IU]/mL (ref 0.35–5.50)

## 2023-06-24 DIAGNOSIS — F411 Generalized anxiety disorder: Secondary | ICD-10-CM | POA: Diagnosis not present

## 2023-06-28 DIAGNOSIS — M6281 Muscle weakness (generalized): Secondary | ICD-10-CM | POA: Diagnosis not present

## 2023-06-28 DIAGNOSIS — M5459 Other low back pain: Secondary | ICD-10-CM | POA: Diagnosis not present

## 2023-06-28 DIAGNOSIS — M2569 Stiffness of other specified joint, not elsewhere classified: Secondary | ICD-10-CM | POA: Diagnosis not present

## 2023-06-28 DIAGNOSIS — M542 Cervicalgia: Secondary | ICD-10-CM | POA: Diagnosis not present

## 2023-06-30 DIAGNOSIS — M2569 Stiffness of other specified joint, not elsewhere classified: Secondary | ICD-10-CM | POA: Diagnosis not present

## 2023-06-30 DIAGNOSIS — M5459 Other low back pain: Secondary | ICD-10-CM | POA: Diagnosis not present

## 2023-06-30 DIAGNOSIS — M542 Cervicalgia: Secondary | ICD-10-CM | POA: Diagnosis not present

## 2023-06-30 DIAGNOSIS — M6281 Muscle weakness (generalized): Secondary | ICD-10-CM | POA: Diagnosis not present

## 2023-07-05 DIAGNOSIS — M6281 Muscle weakness (generalized): Secondary | ICD-10-CM | POA: Diagnosis not present

## 2023-07-05 DIAGNOSIS — M542 Cervicalgia: Secondary | ICD-10-CM | POA: Diagnosis not present

## 2023-07-05 DIAGNOSIS — M2569 Stiffness of other specified joint, not elsewhere classified: Secondary | ICD-10-CM | POA: Diagnosis not present

## 2023-07-05 DIAGNOSIS — M5459 Other low back pain: Secondary | ICD-10-CM | POA: Diagnosis not present

## 2023-07-07 DIAGNOSIS — M6281 Muscle weakness (generalized): Secondary | ICD-10-CM | POA: Diagnosis not present

## 2023-07-07 DIAGNOSIS — M542 Cervicalgia: Secondary | ICD-10-CM | POA: Diagnosis not present

## 2023-07-07 DIAGNOSIS — M2569 Stiffness of other specified joint, not elsewhere classified: Secondary | ICD-10-CM | POA: Diagnosis not present

## 2023-07-07 DIAGNOSIS — M5459 Other low back pain: Secondary | ICD-10-CM | POA: Diagnosis not present

## 2023-07-15 DIAGNOSIS — M5459 Other low back pain: Secondary | ICD-10-CM | POA: Diagnosis not present

## 2023-07-15 DIAGNOSIS — M6281 Muscle weakness (generalized): Secondary | ICD-10-CM | POA: Diagnosis not present

## 2023-07-15 DIAGNOSIS — M2569 Stiffness of other specified joint, not elsewhere classified: Secondary | ICD-10-CM | POA: Diagnosis not present

## 2023-07-15 DIAGNOSIS — M542 Cervicalgia: Secondary | ICD-10-CM | POA: Diagnosis not present

## 2023-07-19 DIAGNOSIS — M6281 Muscle weakness (generalized): Secondary | ICD-10-CM | POA: Diagnosis not present

## 2023-07-19 DIAGNOSIS — M5459 Other low back pain: Secondary | ICD-10-CM | POA: Diagnosis not present

## 2023-07-19 DIAGNOSIS — M2569 Stiffness of other specified joint, not elsewhere classified: Secondary | ICD-10-CM | POA: Diagnosis not present

## 2023-07-19 DIAGNOSIS — M542 Cervicalgia: Secondary | ICD-10-CM | POA: Diagnosis not present

## 2023-07-22 DIAGNOSIS — M5459 Other low back pain: Secondary | ICD-10-CM | POA: Diagnosis not present

## 2023-07-22 DIAGNOSIS — M6281 Muscle weakness (generalized): Secondary | ICD-10-CM | POA: Diagnosis not present

## 2023-07-22 DIAGNOSIS — M2569 Stiffness of other specified joint, not elsewhere classified: Secondary | ICD-10-CM | POA: Diagnosis not present

## 2023-07-22 DIAGNOSIS — M542 Cervicalgia: Secondary | ICD-10-CM | POA: Diagnosis not present

## 2023-07-26 DIAGNOSIS — M2569 Stiffness of other specified joint, not elsewhere classified: Secondary | ICD-10-CM | POA: Diagnosis not present

## 2023-07-26 DIAGNOSIS — M5459 Other low back pain: Secondary | ICD-10-CM | POA: Diagnosis not present

## 2023-07-26 DIAGNOSIS — M542 Cervicalgia: Secondary | ICD-10-CM | POA: Diagnosis not present

## 2023-07-26 DIAGNOSIS — M6281 Muscle weakness (generalized): Secondary | ICD-10-CM | POA: Diagnosis not present

## 2023-07-28 DIAGNOSIS — M6281 Muscle weakness (generalized): Secondary | ICD-10-CM | POA: Diagnosis not present

## 2023-07-28 DIAGNOSIS — M2569 Stiffness of other specified joint, not elsewhere classified: Secondary | ICD-10-CM | POA: Diagnosis not present

## 2023-07-28 DIAGNOSIS — M542 Cervicalgia: Secondary | ICD-10-CM | POA: Diagnosis not present

## 2023-07-28 DIAGNOSIS — M5459 Other low back pain: Secondary | ICD-10-CM | POA: Diagnosis not present

## 2023-08-02 DIAGNOSIS — M6281 Muscle weakness (generalized): Secondary | ICD-10-CM | POA: Diagnosis not present

## 2023-08-02 DIAGNOSIS — M542 Cervicalgia: Secondary | ICD-10-CM | POA: Diagnosis not present

## 2023-08-02 DIAGNOSIS — M5459 Other low back pain: Secondary | ICD-10-CM | POA: Diagnosis not present

## 2023-08-02 DIAGNOSIS — M2569 Stiffness of other specified joint, not elsewhere classified: Secondary | ICD-10-CM | POA: Diagnosis not present

## 2023-08-09 DIAGNOSIS — M542 Cervicalgia: Secondary | ICD-10-CM | POA: Diagnosis not present

## 2023-08-09 DIAGNOSIS — M5459 Other low back pain: Secondary | ICD-10-CM | POA: Diagnosis not present

## 2023-08-09 DIAGNOSIS — M6281 Muscle weakness (generalized): Secondary | ICD-10-CM | POA: Diagnosis not present

## 2023-08-09 DIAGNOSIS — M2569 Stiffness of other specified joint, not elsewhere classified: Secondary | ICD-10-CM | POA: Diagnosis not present

## 2023-08-11 DIAGNOSIS — M6281 Muscle weakness (generalized): Secondary | ICD-10-CM | POA: Diagnosis not present

## 2023-08-11 DIAGNOSIS — M542 Cervicalgia: Secondary | ICD-10-CM | POA: Diagnosis not present

## 2023-08-11 DIAGNOSIS — M5459 Other low back pain: Secondary | ICD-10-CM | POA: Diagnosis not present

## 2023-08-11 DIAGNOSIS — M2569 Stiffness of other specified joint, not elsewhere classified: Secondary | ICD-10-CM | POA: Diagnosis not present

## 2023-08-16 DIAGNOSIS — M6281 Muscle weakness (generalized): Secondary | ICD-10-CM | POA: Diagnosis not present

## 2023-08-16 DIAGNOSIS — M542 Cervicalgia: Secondary | ICD-10-CM | POA: Diagnosis not present

## 2023-08-16 DIAGNOSIS — M2569 Stiffness of other specified joint, not elsewhere classified: Secondary | ICD-10-CM | POA: Diagnosis not present

## 2023-08-16 DIAGNOSIS — M5459 Other low back pain: Secondary | ICD-10-CM | POA: Diagnosis not present

## 2023-08-18 DIAGNOSIS — M2569 Stiffness of other specified joint, not elsewhere classified: Secondary | ICD-10-CM | POA: Diagnosis not present

## 2023-08-18 DIAGNOSIS — M542 Cervicalgia: Secondary | ICD-10-CM | POA: Diagnosis not present

## 2023-08-18 DIAGNOSIS — M6281 Muscle weakness (generalized): Secondary | ICD-10-CM | POA: Diagnosis not present

## 2023-08-18 DIAGNOSIS — M5459 Other low back pain: Secondary | ICD-10-CM | POA: Diagnosis not present

## 2023-09-22 DIAGNOSIS — F411 Generalized anxiety disorder: Secondary | ICD-10-CM | POA: Diagnosis not present

## 2023-09-23 ENCOUNTER — Other Ambulatory Visit: Payer: Self-pay | Admitting: Adult Health

## 2023-09-25 ENCOUNTER — Other Ambulatory Visit: Payer: Self-pay | Admitting: Adult Health

## 2023-09-28 ENCOUNTER — Ambulatory Visit (INDEPENDENT_AMBULATORY_CARE_PROVIDER_SITE_OTHER)

## 2023-09-28 VITALS — Ht 61.5 in | Wt 107.0 lb

## 2023-09-28 DIAGNOSIS — Z0000 Encounter for general adult medical examination without abnormal findings: Secondary | ICD-10-CM

## 2023-09-28 NOTE — Patient Instructions (Addendum)
 Andrea Fields , Thank you for taking time out of your busy schedule to complete your Annual Wellness Visit with me. I enjoyed our conversation and look forward to speaking with you again next year. I, as well as your care team,  appreciate your ongoing commitment to your health goals. Please review the following plan we discussed and let me know if I can assist you in the future. Your Game plan/ To Do List    Referrals: If you haven't heard from the office you've been referred to, please reach out to them at the phone provided.   Follow up Visits: We will see or speak with you next year for your Next Medicare AWV with our clinical staff 10/03/24 @ 8a  Have you seen your provider in the last 6 months (3 months if uncontrolled diabetes)?   Clinician Recommendations:  Aim for 30 minutes of exercise or brisk walking, 6-8 glasses of water, and 5 servings of fruits and vegetables each day.       This is a list of the screenings recommended for you:  Health Maintenance  Topic Date Due   COVID-19 Vaccine (4 - 2024-25 season) 10/03/2022   Flu Shot  09/02/2023   DTaP/Tdap/Td vaccine (2 - Td or Tdap) 05/12/2024   Medicare Annual Wellness Visit  09/27/2024   Mammogram  12/19/2024   Cologuard (Stool DNA test)  12/08/2025   Pneumococcal Vaccine for age over 79  Completed   DEXA scan (bone density measurement)  Completed   Zoster (Shingles) Vaccine  Completed   Hepatitis C Screening  Addressed   HPV Vaccine  Aged Out   Meningitis B Vaccine  Aged Out    Advanced directives: (Copy Requested) Please bring a copy of your health care power of attorney and living will to the office to be added to your chart at your convenience. You can mail to [facility] [address] [place], [place] [address] or email to [email or web address] Advance Care Planning is important because it:  [x]  Makes sure you receive the medical care that is consistent with your values, goals, and  preferences  [x]  It provides guidance to your family and loved ones and reduces their decisional burden about whether or not they are making the right decisions based on your wishes.  Follow the link provided in your after visit summary or read over the paperwork we have mailed to you to help you started getting your Advance Directives in place. If you need assistance in completing these, please reach out to us  so that we can help you!  See attachments for Preventive Care and Fall Prevention Tips.
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
## 2023-10-21 DIAGNOSIS — F331 Major depressive disorder, recurrent, moderate: Secondary | ICD-10-CM | POA: Diagnosis not present

## 2023-10-28 DIAGNOSIS — F331 Major depressive disorder, recurrent, moderate: Secondary | ICD-10-CM | POA: Diagnosis not present

## 2023-11-09 DIAGNOSIS — M79672 Pain in left foot: Secondary | ICD-10-CM | POA: Diagnosis not present

## 2023-11-09 DIAGNOSIS — Z79899 Other long term (current) drug therapy: Secondary | ICD-10-CM | POA: Diagnosis not present

## 2023-11-09 DIAGNOSIS — M3505 Sjogren syndrome with inflammatory arthritis: Secondary | ICD-10-CM | POA: Diagnosis not present

## 2023-11-09 DIAGNOSIS — E559 Vitamin D deficiency, unspecified: Secondary | ICD-10-CM | POA: Diagnosis not present

## 2023-11-09 DIAGNOSIS — M79671 Pain in right foot: Secondary | ICD-10-CM | POA: Diagnosis not present

## 2023-11-09 DIAGNOSIS — M79641 Pain in right hand: Secondary | ICD-10-CM | POA: Diagnosis not present

## 2023-11-09 DIAGNOSIS — M0579 Rheumatoid arthritis with rheumatoid factor of multiple sites without organ or systems involvement: Secondary | ICD-10-CM | POA: Diagnosis not present

## 2023-11-09 DIAGNOSIS — M542 Cervicalgia: Secondary | ICD-10-CM | POA: Diagnosis not present

## 2023-11-09 DIAGNOSIS — M810 Age-related osteoporosis without current pathological fracture: Secondary | ICD-10-CM | POA: Diagnosis not present

## 2023-11-09 DIAGNOSIS — F331 Major depressive disorder, recurrent, moderate: Secondary | ICD-10-CM | POA: Diagnosis not present

## 2023-11-09 DIAGNOSIS — M79642 Pain in left hand: Secondary | ICD-10-CM | POA: Diagnosis not present

## 2023-11-28 ENCOUNTER — Other Ambulatory Visit: Payer: Self-pay | Admitting: Adult Health

## 2023-11-28 DIAGNOSIS — F331 Major depressive disorder, recurrent, moderate: Secondary | ICD-10-CM | POA: Diagnosis not present

## 2023-11-28 DIAGNOSIS — F5101 Primary insomnia: Secondary | ICD-10-CM

## 2023-11-30 ENCOUNTER — Other Ambulatory Visit: Payer: Self-pay | Admitting: Adult Health

## 2023-11-30 DIAGNOSIS — Z1231 Encounter for screening mammogram for malignant neoplasm of breast: Secondary | ICD-10-CM

## 2023-12-07 DIAGNOSIS — F331 Major depressive disorder, recurrent, moderate: Secondary | ICD-10-CM | POA: Diagnosis not present

## 2023-12-12 DIAGNOSIS — F331 Major depressive disorder, recurrent, moderate: Secondary | ICD-10-CM | POA: Diagnosis not present

## 2023-12-19 ENCOUNTER — Other Ambulatory Visit: Payer: Self-pay | Admitting: Adult Health

## 2023-12-19 DIAGNOSIS — F331 Major depressive disorder, recurrent, moderate: Secondary | ICD-10-CM | POA: Diagnosis not present

## 2023-12-21 ENCOUNTER — Ambulatory Visit
Admission: RE | Admit: 2023-12-21 | Discharge: 2023-12-21 | Disposition: A | Source: Ambulatory Visit | Attending: Adult Health

## 2023-12-21 ENCOUNTER — Ambulatory Visit

## 2023-12-21 DIAGNOSIS — Z1231 Encounter for screening mammogram for malignant neoplasm of breast: Secondary | ICD-10-CM | POA: Diagnosis not present

## 2024-03-08 ENCOUNTER — Other Ambulatory Visit: Payer: Self-pay | Admitting: Adult Health

## 2024-06-19 ENCOUNTER — Encounter: Admitting: Adult Health

## 2024-10-03 ENCOUNTER — Ambulatory Visit
